# Patient Record
Sex: Male | Born: 1975
Health system: Southern US, Community
[De-identification: ages and names within clinical notes are randomized; demographics above are authoritative.]

## PROBLEM LIST (undated history)

## (undated) DIAGNOSIS — I422 Other hypertrophic cardiomyopathy: Secondary | ICD-10-CM

## (undated) DIAGNOSIS — I77810 Thoracic aortic ectasia: Secondary | ICD-10-CM

## (undated) DIAGNOSIS — I493 Ventricular premature depolarization: Secondary | ICD-10-CM

## (undated) DIAGNOSIS — E785 Hyperlipidemia, unspecified: Secondary | ICD-10-CM

## (undated) DIAGNOSIS — I340 Nonrheumatic mitral (valve) insufficiency: Secondary | ICD-10-CM

## (undated) DIAGNOSIS — I491 Atrial premature depolarization: Secondary | ICD-10-CM

## (undated) DIAGNOSIS — J189 Pneumonia, unspecified organism: Secondary | ICD-10-CM

## (undated) DIAGNOSIS — I4729 Other ventricular tachycardia: Secondary | ICD-10-CM

## (undated) DIAGNOSIS — I441 Atrioventricular block, second degree: Secondary | ICD-10-CM

## (undated) DIAGNOSIS — Z720 Tobacco use: Secondary | ICD-10-CM

## (undated) DIAGNOSIS — I351 Nonrheumatic aortic (valve) insufficiency: Secondary | ICD-10-CM

## (undated) DIAGNOSIS — I517 Cardiomegaly: Secondary | ICD-10-CM

## (undated) DIAGNOSIS — I10 Essential (primary) hypertension: Secondary | ICD-10-CM

## (undated) DIAGNOSIS — I5042 Chronic combined systolic (congestive) and diastolic (congestive) heart failure: Secondary | ICD-10-CM

## (undated) HISTORY — DX: Cardiomegaly: I51.7

## (undated) HISTORY — PX: TONSILLECTOMY: SUR1361

## (undated) HISTORY — DX: Hyperlipidemia, unspecified: E78.5

## (undated) HISTORY — DX: Chronic combined systolic (congestive) and diastolic (congestive) heart failure: I50.42

## (undated) HISTORY — DX: Thoracic aortic ectasia: I77.810

## (undated) HISTORY — DX: Nonrheumatic aortic (valve) insufficiency: I35.1

## (undated) HISTORY — DX: Atrial premature depolarization: I49.1

## (undated) HISTORY — DX: Nonrheumatic mitral (valve) insufficiency: I34.0

## (undated) HISTORY — DX: Pneumonia, unspecified organism: J18.9

## (undated) HISTORY — DX: Other ventricular tachycardia: I47.29

## (undated) HISTORY — PX: KNEE SURGERY: SHX244

## (undated) HISTORY — PX: CARDIAC CATHETERIZATION: SHX172

## (undated) HISTORY — DX: Other hypertrophic cardiomyopathy: I42.2

## (undated) HISTORY — DX: Atrioventricular block, second degree: I44.1

## (undated) HISTORY — DX: Tobacco use: Z72.0

## (undated) HISTORY — DX: Ventricular premature depolarization: I49.3

## (undated) HISTORY — PX: CAROTID STENT INSERTION: SHX5766

---

## 2016-04-16 DIAGNOSIS — F17210 Nicotine dependence, cigarettes, uncomplicated: Secondary | ICD-10-CM | POA: Insufficient documentation

## 2020-05-22 ENCOUNTER — Other Ambulatory Visit: Payer: Self-pay

## 2020-05-22 ENCOUNTER — Encounter (HOSPITAL_COMMUNITY): Payer: Self-pay

## 2020-05-22 ENCOUNTER — Inpatient Hospital Stay (HOSPITAL_COMMUNITY)
Admission: EM | Admit: 2020-05-22 | Discharge: 2020-05-23 | DRG: 304 | Discharge status: Against Medical Advice | Payer: Self-pay | Attending: Family Medicine | Admitting: Family Medicine

## 2020-05-22 ENCOUNTER — Encounter (HOSPITAL_COMMUNITY): Payer: Self-pay | Admitting: Emergency Medicine

## 2020-05-22 ENCOUNTER — Emergency Department (HOSPITAL_COMMUNITY): Payer: Self-pay

## 2020-05-22 ENCOUNTER — Ambulatory Visit (HOSPITAL_COMMUNITY)
Admission: EM | Admit: 2020-05-22 | Discharge: 2020-05-22 | Disposition: A | Discharge status: Home / Self Care | Payer: Self-pay

## 2020-05-22 DIAGNOSIS — I2721 Secondary pulmonary arterial hypertension: Secondary | ICD-10-CM | POA: Diagnosis present

## 2020-05-22 DIAGNOSIS — R059 Cough, unspecified: Secondary | ICD-10-CM

## 2020-05-22 DIAGNOSIS — J189 Pneumonia, unspecified organism: Secondary | ICD-10-CM

## 2020-05-22 DIAGNOSIS — I169 Hypertensive crisis, unspecified: Secondary | ICD-10-CM

## 2020-05-22 DIAGNOSIS — E669 Obesity, unspecified: Secondary | ICD-10-CM | POA: Diagnosis present

## 2020-05-22 DIAGNOSIS — I509 Heart failure, unspecified: Secondary | ICD-10-CM | POA: Diagnosis present

## 2020-05-22 DIAGNOSIS — N179 Acute kidney failure, unspecified: Secondary | ICD-10-CM | POA: Diagnosis present

## 2020-05-22 DIAGNOSIS — I712 Thoracic aortic aneurysm, without rupture: Secondary | ICD-10-CM | POA: Diagnosis present

## 2020-05-22 DIAGNOSIS — Z7982 Long term (current) use of aspirin: Secondary | ICD-10-CM

## 2020-05-22 DIAGNOSIS — Z6834 Body mass index (BMI) 34.0-34.9, adult: Secondary | ICD-10-CM

## 2020-05-22 DIAGNOSIS — I110 Hypertensive heart disease with heart failure: Secondary | ICD-10-CM | POA: Diagnosis present

## 2020-05-22 DIAGNOSIS — Z79899 Other long term (current) drug therapy: Secondary | ICD-10-CM

## 2020-05-22 DIAGNOSIS — Z91048 Other nonmedicinal substance allergy status: Secondary | ICD-10-CM

## 2020-05-22 DIAGNOSIS — F17200 Nicotine dependence, unspecified, uncomplicated: Secondary | ICD-10-CM | POA: Diagnosis present

## 2020-05-22 DIAGNOSIS — E785 Hyperlipidemia, unspecified: Secondary | ICD-10-CM | POA: Diagnosis present

## 2020-05-22 DIAGNOSIS — I161 Hypertensive emergency: Principal | ICD-10-CM | POA: Diagnosis present

## 2020-05-22 DIAGNOSIS — M7989 Other specified soft tissue disorders: Secondary | ICD-10-CM

## 2020-05-22 DIAGNOSIS — I5020 Unspecified systolic (congestive) heart failure: Secondary | ICD-10-CM

## 2020-05-22 DIAGNOSIS — U071 COVID-19: Secondary | ICD-10-CM

## 2020-05-22 HISTORY — DX: Essential (primary) hypertension: I10

## 2020-05-22 HISTORY — DX: Hypertensive crisis, unspecified: I16.9

## 2020-05-22 LAB — CBC
HCT: 42.6 % (ref 39.0–52.0)
Hemoglobin: 14.1 g/dL (ref 13.0–17.0)
MCH: 28.8 pg (ref 26.0–34.0)
MCHC: 33.1 g/dL (ref 30.0–36.0)
MCV: 87.1 fL (ref 80.0–100.0)
Platelets: 282 10*3/uL (ref 150–400)
RBC: 4.89 MIL/uL (ref 4.22–5.81)
RDW: 14.3 % (ref 11.5–15.5)
WBC: 15.3 10*3/uL — ABNORMAL HIGH (ref 4.0–10.5)
nRBC: 0 % (ref 0.0–0.2)

## 2020-05-22 LAB — TROPONIN I (HIGH SENSITIVITY)
Troponin I (High Sensitivity): 404 ng/L (ref <18)
Troponin I (High Sensitivity): 433 ng/L (ref <18)
Troponin I (High Sensitivity): 439 ng/L (ref <18)
Troponin I (High Sensitivity): 468 ng/L (ref <18)

## 2020-05-22 LAB — RAPID URINE DRUG SCREEN, HOSP PERFORMED
Amphetamines: NOT DETECTED
Barbiturates: NOT DETECTED
Benzodiazepines: NOT DETECTED
Cocaine: NOT DETECTED
Opiates: NOT DETECTED
Tetrahydrocannabinol: NOT DETECTED

## 2020-05-22 LAB — BASIC METABOLIC PANEL
Anion gap: 9 (ref 5–15)
BUN: 22 mg/dL — ABNORMAL HIGH (ref 6–20)
CO2: 23 mmol/L (ref 22–32)
Calcium: 9 mg/dL (ref 8.9–10.3)
Chloride: 104 mmol/L (ref 98–111)
Creatinine, Ser: 1.38 mg/dL — ABNORMAL HIGH (ref 0.61–1.24)
GFR, Estimated: >60 mL/min (ref >60)
Glucose, Bld: 123 mg/dL — ABNORMAL HIGH (ref 70–99)
Potassium: 3.5 mmol/L (ref 3.5–5.1)
Sodium: 136 mmol/L (ref 135–145)

## 2020-05-22 LAB — BRAIN NATRIURETIC PEPTIDE: B Natriuretic Peptide: 3522 pg/mL — ABNORMAL HIGH (ref 0.0–100.0)

## 2020-05-22 LAB — HIV ANTIBODY (ROUTINE TESTING W REFLEX): HIV Screen 4th Generation wRfx: NONREACTIVE

## 2020-05-22 LAB — CREATININE, SERUM
Creatinine, Ser: 1.46 mg/dL — ABNORMAL HIGH (ref 0.61–1.24)
GFR, Estimated: >60 mL/min (ref >60)

## 2020-05-22 LAB — RESP PANEL BY RT-PCR (FLU A&B, COVID) ARPGX2
Influenza A by PCR: NEGATIVE
Influenza B by PCR: NEGATIVE
SARS Coronavirus 2 by RT PCR: POSITIVE — AB

## 2020-05-22 MED ORDER — FUROSEMIDE 10 MG/ML IJ SOLN
20.0000 mg | Freq: Once | INTRAMUSCULAR | Status: AC
  Administered 2020-05-22: 20 mg via INTRAVENOUS
  Filled 2020-05-22: qty 2

## 2020-05-22 MED ORDER — DILTIAZEM HCL 25 MG/5ML IV SOLN: 10.0000 mg | Freq: Once | INTRAVENOUS | Status: DC

## 2020-05-22 MED ORDER — SODIUM CHLORIDE 0.9% FLUSH
3.0000 mL | INTRAVENOUS | Status: DC | PRN
Start: 2020-05-22 — End: 2020-05-23
  Administered 2020-05-22: 3 mL via INTRAVENOUS

## 2020-05-22 MED ORDER — HEPARIN BOLUS VIA INFUSION
4000.0000 [IU] | Freq: Once | INTRAVENOUS | Status: DC
  Filled 2020-05-22: qty 4000

## 2020-05-22 MED ORDER — SODIUM CHLORIDE 0.9 % IV SOLN
200.0000 mg | Freq: Once | INTRAVENOUS | Status: DC
  Filled 2020-05-22: qty 40

## 2020-05-22 MED ORDER — ONDANSETRON HCL 4 MG/2ML IJ SOLN: 4.0000 mg | Freq: Four times a day (QID) | INTRAMUSCULAR | Status: DC | PRN

## 2020-05-22 MED ORDER — METOPROLOL TARTRATE 12.5 MG HALF TABLET: 12.5000 mg | ORAL_TABLET | Freq: Two times a day (BID) | ORAL | Status: DC

## 2020-05-22 MED ORDER — SODIUM CHLORIDE 0.9 % IV SOLN
500.0000 mg | INTRAVENOUS | Status: DC
  Administered 2020-05-22: 500 mg via INTRAVENOUS
  Filled 2020-05-22 (×2): qty 500

## 2020-05-22 MED ORDER — NICOTINE 7 MG/24HR TD PT24
7.0000 mg | MEDICATED_PATCH | Freq: Every day | TRANSDERMAL | Status: DC
  Filled 2020-05-22 (×2): qty 1

## 2020-05-22 MED ORDER — SODIUM CHLORIDE 0.9 % IV SOLN
100.0000 mg | Freq: Every day | INTRAVENOUS | Status: DC
  Filled 2020-05-22: qty 20

## 2020-05-22 MED ORDER — SODIUM CHLORIDE 0.9% FLUSH: 3.0000 mL | Freq: Two times a day (BID) | INTRAVENOUS | Status: DC

## 2020-05-22 MED ORDER — ASPIRIN EC 81 MG PO TBEC
81.0000 mg | DELAYED_RELEASE_TABLET | Freq: Every day | ORAL | Status: DC
  Administered 2020-05-23: 81 mg via ORAL
  Filled 2020-05-22: qty 1

## 2020-05-22 MED ORDER — IOHEXOL 350 MG/ML SOLN
75.0000 mL | Freq: Once | INTRAVENOUS | Status: AC | PRN
  Administered 2020-05-22: 75 mL via INTRAVENOUS

## 2020-05-22 MED ORDER — NITROGLYCERIN IN D5W 200-5 MCG/ML-% IV SOLN
0.0000 ug/min | INTRAVENOUS | Status: DC
  Administered 2020-05-22: 5 ug/min via INTRAVENOUS
  Filled 2020-05-22: qty 250

## 2020-05-22 MED ORDER — HEPARIN (PORCINE) 25000 UT/250ML-% IV SOLN
1250.0000 [IU]/h | INTRAVENOUS | Status: DC
  Filled 2020-05-22: qty 250

## 2020-05-22 MED ORDER — ENOXAPARIN SODIUM 40 MG/0.4ML ~~LOC~~ SOLN
40.0000 mg | SUBCUTANEOUS | Status: DC
  Administered 2020-05-22: 40 mg via SUBCUTANEOUS
  Filled 2020-05-22: qty 0.4

## 2020-05-22 MED ORDER — SODIUM CHLORIDE 0.9 % IV SOLN: 250.0000 mL | INTRAVENOUS | Status: DC | PRN

## 2020-05-22 MED ORDER — METOPROLOL TARTRATE 12.5 MG HALF TABLET
12.5000 mg | ORAL_TABLET | Freq: Two times a day (BID) | ORAL | Status: DC
  Administered 2020-05-22 – 2020-05-23 (×2): 12.5 mg via ORAL
  Filled 2020-05-22 (×2): qty 1

## 2020-05-22 MED ORDER — SODIUM CHLORIDE 0.9 % IV SOLN
2.0000 g | INTRAVENOUS | Status: DC
  Administered 2020-05-22: 2 g via INTRAVENOUS
  Filled 2020-05-22: qty 20
  Filled 2020-05-22: qty 2

## 2020-05-22 MED ORDER — ACETAMINOPHEN 325 MG PO TABS
650.0000 mg | ORAL_TABLET | ORAL | Status: DC | PRN
  Administered 2020-05-22 – 2020-05-23 (×2): 650 mg via ORAL
  Filled 2020-05-22 (×2): qty 2

## 2020-05-22 NOTE — ED Notes (Signed)
Pt refused to go to the ED by EMS, states "I feel fine".

## 2020-05-22 NOTE — H&P (Addendum)
[facility] Admission History and Physical Service Pager: [phone]  Patient name: Thomas Spears Medical record number: [id number] Date of birth: 08-27-1975 Age: 45 y.o. Gender: male  Primary Care Provider: Pcp, No Consultants: Cardiology, CCM Code Status: Full code Preferred Emergency Contact: Roxan Hockey, [phone]  Chief Complaint: dyspnea, cough  Assessment and Plan: Thomas Spears is a 45 y.o. male presenting with dyspnea, HA, hypertensive crisis. PMH is significant for TN.  Hypertensive Crisis  HA 45 yo male presents with headache and dyspnea, found to have hypertensive crisis. His BP is elevated to 220s/150-160s. 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 Patient has previously been on lisinopril and HCTZ in the past, but has not used in quite a while, non-adherent. At best when on 2-3 medications, HTN has been 140s-150s at baseline on chart review. HR mildly tachycardic 100-110. Patient was started on nitro infusion in the ED and BP remains persistently in the 220s/150s-160s. Goal is SBP 180. Troponins elevated at 400>433. Discussed case with cardiology fellow, who recommends nicardipine infusion; troponin elevation most likely due to HTN emergency, but will continue to trend. Patient is presently on progressive and overall appears stable and essentially asymptomatic, no focal neuro deficits, but typically protocol is not for two anti-hypertensive infusions outside of the ICU. We have also considered po CCB  vs labetalol since patient does appear stable. As a result, consulted CCM, appreciate recommendations.  -Admit to progressive, FPTS, Dr. Owens Shark -Consult cardiology, appreciate recommendations -Consult CCM, appreciate recommendations -trend troponin  -Continuous cardiac monitoring -ASA 81 mg -monitor BP q59m -Nitro gtt -Heart healthy diet -Up with  assistance -Lovenox DVT ppx  Dyspnea  CHF Patient presents with dyspnea, found to have marked cardiomegaly on CXR. On exam, patient has displaced PMI to near midaxillary line, S3 gallop, only trace peripheral edema, mild JVP. Appears euvolemic overall. In ED, provider noticed that right leg appeared more edematous than left and obtained doppler US that demonstrated no DVT b/l. CTA chest with dilated main pulmonary trunk, indicating pulmonary arterial hypertension. Labs remarkable for BNP 3,522. Patient does not have clear diagnosis of CHF per patient or epic review, but on care everywhere review in 2018 of nuclear scan, LVEF was 37% and there was global hypokinesia wall motion abnormality. Patient is lasix naive, serum cr possibly acutely elevated to 1.38; one dose of lasix 20 mg IV given in ED. Will obtain echo, consult cardiology, appreciate recommendations.  -Consult cardiology -Echo -Strict I&Os -Daily weights -s/p lasix 20 mg IV -AM BMP -PT/OT  AKI vs CKD Patient has mildly elevated serum creatinine to 1.36. Unclear recent baseline. Given degree of hypertension, could have some degree of chronic kidney disease. Per care everywhere, Cr in 2016 was WNL at 0.87. Will monitor s cr daily. Patient got one dose of IV lasix 20 mg. He has signs of severe heart failure on exam, will await echo prior to giving more fluids. Does not appear volume overloaded on exam.   COVID-19 Patient reports 2 weeks of cough, otherwise asymptomatic. Not immunized. Found to be COVID-19 positive on admission. CTA chest with signs of multifocal pna and ground glass opacity, likely to be due to COVID, but see below for CCM recommendations. Patient is stable on RA. Will treat with remdesivir 3 day infusion. -Continuous pulse oximetry -Maintain O2 sats >92% -Airborne and contact isolation -AM CMP -Remdesivir  Leukocytosis  CAP? Mild WBC elevation  to 15.3, however patient has known COVID diagnosis and is afebrile.  Discussed with Dr. Patsey Berthold, CCM/pulmonology, who reports that on read of CT there appears to be more than just ground glass opcaity leading her to believe there may be a bacterial pna at play. She recommends CAP coverage. Will add CTX and azithromycin. Will obtain bld cx prior to abx. - AM CBC - CTX, azithromycin  Hyperlipidemia  Patient has history of HLD, previously on a statin. Also has h/o carotid stent insertion. - Check lipid panel and consider adding statin  Tobacco Use  Smoking since 45 years old 1.5 ppd until 1 month ago, now weaned down to 4 cigarettes per day. Will give nicotine patch and recommend cessation with counseling. -Nicotine patch  Ascending Thoracic Aortic Aneurysm Ascending thoracic aortic aneurysm of 4.7 cm found incidentally on CTA chest on admission, in setting of many years of severe HTN and HTN crisis today. Will refer to [facility] as outpatient for regular monitoring.  Healthcare Maintenance  Patient has not been followed in many years by physicians, has risk factors for comorbidities and father passed away at age 12 from heart and pneumonia related conditions. -Hgb A1c, TSH -TOC for PCP -Recommend USPSTF age appropriate screenings as an outpatient  FEN/GI: heart healthy diet Prophylaxis: lovenox  Disposition: admit to progressive pending medical stabilization and work up  History of Present Illness:  Thomas Spears is a 45 y.o. male presenting with cough, SOB, ankle swelling. He has had a bothersome cough for 2 weeks. He endorses SOB, pleuritic CP on right with coughing. Denies fever, chills, n/v/d. He has not seen a doctor in a long time. Previously he has been on HCTZ, lisinopril, and lipitor, currently not taking them. Per chart review, when he is on medications, his BP is at best 140s-150s. He uses advil as needed. Declines EtOH, last drink 1 year ago. No recreational drugs.   Review Of Systems: Per HPI with the following additions:   Review of Systems   Constitutional: Negative for chills and fever.  Respiratory: Positive for cough and shortness of breath. Negative for wheezing.   Cardiovascular: Positive for chest pain and leg swelling. Negative for palpitations.  Neurological: Positive for headaches.  Psychiatric/Behavioral: Negative for confusion.  All other systems reviewed and are negative.    Patient Active Problem List   Diagnosis Date Noted  . Hypertensive crisis 05/22/2020    Past Medical History: Past Medical History:  Diagnosis Date  . Hypertension     Past Surgical History: Past Surgical History:  Procedure Laterality Date  . CAROTID STENT INSERTION      Social History: Social History   Tobacco Use  . Smoking status: Light Tobacco Smoker  . Smokeless tobacco: Never Used  Substance Use Topics  . Alcohol use: Not Currently   Additional social history: has fiancee and 7 mo child Please also refer to relevant sections of EMR.  Family History: No family history on file. Dad- CABG x5, passed 26yo from pna  Allergies and Medications: Allergies  Allergen Reactions  . Mushroom Extract Complex Anaphylaxis   No current facility-administered medications on file prior to encounter.   Current Outpatient Medications on File Prior to Encounter  Medication Sig Dispense Refill  . aspirin 81 MG EC tablet Take by mouth.    Marland Kitchen atorvastatin (LIPITOR) 40 MG tablet Take by mouth.    Marland Kitchen lisinopril-hydrochlorothiazide (ZESTORETIC) 20-25 MG tablet Take 1 tablet by mouth daily.    . metoprolol succinate (TOPROL-XL) 25 MG 24  hr tablet Take 1 tablet by mouth daily.      Objective: BP (!) 204/148 (BP Location: Right Arm)   Pulse 90   Temp 98.1 F (36.7 C) (Oral)   Resp (!) 29   Ht 5\' 9"  (1.753 m)   Wt 106 kg   SpO2 96%   BMI 34.50 kg/m  Exam: General: appears older than stated age, WM, resting comfortably in bed, NAD, obese Eyes: PERRLA, anicteric sclerae ENTM: clear oropharynx, MMM Neck: no LAD, thyroid not  palpable Cardiovascular: tachycardic rate, regular rhythm, no murmur, strong S3 gallop, PMI near mid axillary line, mild JVD Respiratory: Crackles heard in all lung fields, no iWOB, stable on RA Gastrointestinal: soft, NT, ND, normoactive BM MSK: using all four limbs equally, only trace b/l LE edema Derm: no rashes Neuro: no CN focal deficits, normal sensation, normal strenth, normal reflexes b/l Psych: full affect, anxious appearing, pleasant  Labs and Imaging: CBC BMET  Recent Labs  Lab 05/22/20 1254  WBC 15.3*  HGB 14.1  HCT 42.6  PLT 282   Recent Labs  Lab 05/22/20 1254 05/22/20 1848  NA 136  --   K 3.5  --   CL 104  --   CO2 23  --   BUN 22*  --   CREATININE 1.38* 1.46*  GLUCOSE 123*  --   CALCIUM 9.0  --      EKG: sinus tachycardia with LVH, no QTc prolongation, calculated to be 427  DG Chest 2 View  Result Date: 05/22/2020 CLINICAL DATA:  Chest pain and SOB. EXAM: CHEST - 2 VIEW COMPARISON:  None FINDINGS: There is marked cardiac enlargement. Mild diffuse interstitial edema. No pleural effusion or airspace consolidation. Visualized osseous structures are notable for mild thoracic degenerative disc disease. IMPRESSION: Cardiac enlargement and mild interstitial edema. Electronically Signed   By: Kerby Moors M.D.   On: 05/22/2020 13:15   CT Angio Chest PE W and/or Wo Contrast  Result Date: 05/22/2020 CLINICAL DATA:  Cough, elevated D-dimer EXAM: CT ANGIOGRAPHY CHEST WITH CONTRAST TECHNIQUE: Multidetector CT imaging of the chest was performed using the standard protocol during bolus administration of intravenous contrast. Multiplanar CT image reconstructions and MIPs were obtained to evaluate the vascular anatomy. CONTRAST:  95mL OMNIPAQUE IOHEXOL 350 MG/ML SOLN COMPARISON:  Chest x-ray 05/22/2020 FINDINGS: Cardiovascular: Marked four-chamber cardiomegaly with marked left ventricular hypertrophy. Trace pericardial effusion. Satisfactory opacification of the pulmonary  arteries to the segmental branch level. No filling defect identified to suggest pulmonary embolism. Main pulmonary trunk is dilated measuring 4.1 cm in diameter. 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 Mediastinum/Nodes: Enlarged precarinal lymph node measuring 1.4 cm in diameter. 1.1 cm AP window node. Mildly prominent right hilar lymph nodes. No axillary or left hilar lymphadenopathy. No abnormality of the thyroid, trachea, or esophagus is seen. Lungs/Pleura: Multifocal patchy airspace opacity most pronounced within the right upper lobe with additional areas of opacity present in the superior segment of the right lower lobe as well as within the right middle lobe. Left basilar atelectasis. No pleural effusion. No pneumothorax. Upper Abdomen: No acute abnormality. Musculoskeletal: No acute osseous abnormality. Bilateral gynecomastia. Review of the MIP images confirms the above findings. IMPRESSION: 1. No evidence of acute pulmonary embolism. 2. Multifocal patchy airspace opacity within the right lung most pronounced within the right upper lobe. Findings are concerning for multifocal pneumonia. Follow-up to resolution is recommended. 3. 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 Recommend semi-annual  imaging followup by CTA or MRA and referral to cardiothoracic surgery if not already obtained. This recommendation follows 2010 ACCF/AHA/AATS/ACR/ASA/SCA/SCAI/SIR/STS/SVM Guidelines for the Diagnosis and Management of Patients With Thoracic Aortic Disease. Circulation. 2010; 121: U981-X914. Aortic aneurysm NOS (ICD10-I71.9) 4. Marked four-chamber cardiomegaly with marked left ventricular hypertrophy. 5. Dilated main pulmonary trunk measuring 4.1 cm in diameter, suggesting pulmonary arterial hypertension. 6. Mildly enlarged mediastinal and right hilar lymph nodes, likely reactive. Electronically Signed   By: Davina Poke D.O.   On: 05/22/2020 16:04   VAS [place]  LOWER EXTREMITY VENOUS (DVT) (ONLY [facility] & [facility] 7a-7p)  Result Date: 05/22/2020  Lower Venous DVT Study Indications: Swelling.  Comparison Study: no prior Performing Technologist: Abram Sander RVS  Examination Guidelines: A complete evaluation includes B-mode imaging, spectral Doppler, color Doppler, and power Doppler as needed of all accessible portions of each vessel. Bilateral testing is considered an integral part of a complete examination. Limited examinations for reoccurring indications may be performed as noted. The reflux portion of the exam is performed with the patient in reverse Trendelenburg.  +---------+---------------+---------+-----------+----------+--------------+ RIGHT    CompressibilityPhasicitySpontaneityPropertiesThrombus Aging +---------+---------------+---------+-----------+----------+--------------+ CFV      Full           Yes      Yes                                 +---------+---------------+---------+-----------+----------+--------------+ SFJ      Full                                                        +---------+---------------+---------+-----------+----------+--------------+ FV Prox  Full                                                        +---------+---------------+---------+-----------+----------+--------------+ FV Mid   Full                                                        +---------+---------------+---------+-----------+----------+--------------+ FV DistalFull                                                        +---------+---------------+---------+-----------+----------+--------------+ PFV      Full                                                        +---------+---------------+---------+-----------+----------+--------------+ POP      Full           Yes      Yes                                 +---------+---------------+---------+-----------+----------+--------------+  PTV      Full                                                         +---------+---------------+---------+-----------+----------+--------------+ PERO     Full                                                        +---------+---------------+---------+-----------+----------+--------------+   +----+---------------+---------+-----------+----------+--------------+ LEFTCompressibilityPhasicitySpontaneityPropertiesThrombus Aging +----+---------------+---------+-----------+----------+--------------+ CFV Full           Yes      Yes                                 +----+---------------+---------+-----------+----------+--------------+     Summary: RIGHT: - There is no evidence of deep vein thrombosis in the lower extremity.  - No cystic structure found in the popliteal fossa.  LEFT: - No evidence of common femoral vein obstruction.  *See table(s) above for measurements and observations.    Preliminary     Gladys Damme, MD 05/22/2020, 8:42 PM PGY-2, [place] [facility] pager: [phone], text pages welcome

## 2020-05-22 NOTE — Progress Notes (Signed)
Lower extremity venous has been completed.   Preliminary results in CV Proc.   Abram Sander 05/22/2020 2:48 PM

## 2020-05-22 NOTE — ED Notes (Signed)
Patient transported to CT 

## 2020-05-22 NOTE — Progress Notes (Signed)
Interim progress note:  Checked in on patient for p.m. evaluation.  He was admitted earlier this afternoon for hypertensive emergency and COVID-19 positive status.  He reports " I feel just fine."  States he felt a little nauseous after receiving Lovenox, but otherwise has no complaints.  States his cough is minimally present.  Denies any shortness of breath, chest pain, palpitations, or leg swelling.  Declined remdesivir earlier today, states he did not want to take this since he felt like his cough had already improved.  Discussed indication for remdesivir including decreased hospitalization for COVID-19 and possible morbidity reduction.  Blood pressure (!) 167/132, pulse 84, temperature 97.6 F (36.4 C), temperature source Oral, resp. rate 16, height 5\' 9"  (1.753 m), weight 106 kg, SpO2 91 %. General: NAD, sitting up in bed playing on his phone watching TV HEENT: Mucous membranes moist Cardiac: Regular rate and rhythm, no murmurs appreciated Lungs: Breathing comfortably on RA, overall clear breath sounds anteriorly (difficult to auscultate with disposable stethoscope)  Extremities: Warm, dry, no LE swelling Neuro: Alert and oriented, follows normal conversation  Assessment/plan: Fortunately appears very comfortable on exam.  Continue plan as outlined in H&P.  BP while evaluating the room approximately 180/130, at current goal and will maintain drip as is (or go down at metoprolol etc takes effect).  Troponins overall trending flat with slight up rise, will trend until nadir, likely in the setting of hypertensive emergency.  He denies any active chest pain.  Will have day team to rediscuss remdesivir during the day, hold tonight as patient declined.  Patriciaann Clan, DO

## 2020-05-22 NOTE — ED Provider Notes (Signed)
[facility] EMERGENCY DEPARTMENT Provider Note   CSN: [id number] Arrival date & time: 05/22/20  1237     History Chief Complaint  Patient presents with  . Cough  . Chest Pain  . Shortness of Breath    Thomas Spears is a 45 y.o. male sent over from the urgent care for evaluation of cough, shortness of breath and ankle swelling.  He has a past medical history of hypertension, hyperlipidemia and smoking.  He is status post carotid stent placement.  He has not been taking his high blood pressure medication for the past month.  He states he was "too busy" to get to the doctor.  Patient states that he had a very mild cough which suddenly worsened after an episode that occurred 2 weeks ago when he was sitting in bed and had onset of severe reflux symptoms and shortness of breath lasting approximately 10 minutes which resolved.  He states that since that time he has noticed that he has had swelling in both of his ankle, orthopnea, PND and significant exertional dyspnea.  He states he may have had some weight gain as well.  He denies URI symptoms otherwise.  Cough is nonproductive.  He states that swelling in his ankles was worse but has improved and seems to be worse on the right leg.  Not had any fever or chills.  He denies recurrence of symptoms of heartburn or active chest pain.  HPI     Past Medical History:  Diagnosis Date  . Hypertension     There are no problems to display for this patient.   Past Surgical History:  Procedure Laterality Date  . CAROTID STENT INSERTION         No family history on file.  Social History   Tobacco Use  . Smoking status: Light Tobacco Smoker  . Smokeless tobacco: Never Used  Substance Use Topics  . Alcohol use: Not Currently    Home Medications Prior to Admission medications   Medication Sig Start Date End Date Taking? Authorizing Provider  aspirin 81 MG EC tablet Take by mouth.    [provider]   atorvastatin (LIPITOR) 40 MG tablet Take by mouth. 11/22/16   [provider]  lisinopril-hydrochlorothiazide (ZESTORETIC) 20-25 MG tablet Take 1 tablet by mouth daily. 07/30/16   [provider]  metoprolol succinate (TOPROL-XL) 25 MG 24 hr tablet Take 1 tablet by mouth daily. 07/30/16   [provider]    Allergies    Mushroom extract complex  Review of Systems   Review of Systems Ten systems reviewed and are negative for acute change, except as noted in the HPI.   Physical Exam Updated Vital Signs BP (!) 223/166 (BP Location: Right Arm)   Pulse (!) 106   Temp 99.3 F (37.4 C) (Oral)   Resp 16   SpO2 93%   Physical Exam Vitals and nursing note reviewed.  Constitutional:      General: He is not in acute distress.    Appearance: He is well-developed. He is not diaphoretic.  HENT:     Head: Normocephalic and atraumatic.  Eyes:     General: No scleral icterus.    Conjunctiva/sclera: Conjunctivae normal.  Cardiovascular:     Rate and Rhythm: Regular rhythm. Tachycardia present.     Chest Wall: PMI is displaced.     Heart sounds: Gallop present.      Comments: Swelling of the entire right leg up to the knee.  This  is subtle but greater than the left side.  There is pitting edema behind the ankle. Pulmonary:     Effort: Pulmonary effort is normal. Tachypnea present. No respiratory distress.     Breath sounds: Examination of the right-lower field reveals decreased breath sounds. Examination of the left-lower field reveals decreased breath sounds. Decreased breath sounds present. No rales.  Abdominal:     Palpations: Abdomen is soft.     Tenderness: There is no abdominal tenderness.  Musculoskeletal:     Cervical back: Normal range of motion and neck supple.     Right lower leg: 1+ Pitting Edema present.  Skin:    General: Skin is warm and dry.  Neurological:     Mental Status: He is alert.  Psychiatric:        Behavior: Behavior normal.     ED  Results / Procedures / Treatments   Labs (all labs ordered are listed, but only abnormal results are displayed) Labs Reviewed  CBC - Abnormal; Notable for the following components:      Result Value   WBC 15.3 (*)    All other components within normal limits  RESP PANEL BY RT-PCR (FLU A&B, COVID) [id number]  BASIC METABOLIC PANEL  BRAIN NATRIURETIC PEPTIDE  RAPID URINE DRUG SCREEN, HOSP PERFORMED  TROPONIN I (HIGH SENSITIVITY)    EKG None  Radiology DG Chest 2 View  Result Date: 05/22/2020 CLINICAL DATA:  Chest pain and SOB. EXAM: CHEST - 2 VIEW COMPARISON:  None FINDINGS: There is marked cardiac enlargement. Mild diffuse interstitial edema. No pleural effusion or airspace consolidation. Visualized osseous structures are notable for mild thoracic degenerative disc disease. IMPRESSION: Cardiac enlargement and mild interstitial edema. Electronically Signed   By: Kerby Moors M.D.   On: 05/22/2020 13:15    Procedures .Critical Care Performed by: Margarita Mail, PA-C Authorized by: Margarita Mail, PA-C   Critical care provider statement:    Critical care time (minutes):  70   Critical care time was exclusive of:  Separately billable procedures and treating other patients   Critical care was necessary to treat or prevent imminent or life-threatening deterioration of the following conditions:  Cardiac failure   Critical care was time spent personally by me on the following activities:  Discussions with consultants, evaluation of patient's response to treatment, examination of patient, ordering and performing treatments and interventions, ordering and review of laboratory studies, ordering and review of radiographic studies, pulse oximetry, re-evaluation of patient's condition, obtaining history from patient or surrogate and review of old charts   Care discussed with: admitting provider     Medications Ordered in ED Medications - No data to display  ED Course  I have reviewed the  triage vital signs and the nursing notes.  Pertinent labs & imaging results that were available during my care of the patient were reviewed by me and considered in my medical decision making (see chart for details).  Clinical Course as of 05/22/20 1710  Sun May 22, 2020  1542 B Natriuretic Peptide(!): 3,522.0 [AH]    Clinical Course User Index [AH] Margarita Mail, PA-C   MDM Rules/Calculators/A&P                          CC:sob VS: Severely hypertensive, tachypneic, tachycardic HE:NIDPOEU is gathered by patient and tachycardic. Previous records obtained and reviewed. DDX:The patient's complaint of sob involves an extensive number of diagnostic and treatment options, and is a complaint that carries with  it a high risk of complications, morbidity, and potential mortality. Given the large differential diagnosis, medical decision making is of high complexity. The emergent differential diagnosis for shortness of breath includes, but is not limited to, Pulmonary edema, bronchoconstriction, Pneumonia, Pulmonary embolism, Pneumotherax/ Hemothorax, Dysrythmia, ACS.   Labs: I ordered reviewed and interpreted labs which includes cbc with leukocytosis- APR Troponin elevated> 400 BNP >3000 COVID POS BNP- cr -1.38 Imaging: I ordered and reviewed images which included cxr, cta pe, vas duplex LE and . I independently visualized and interpreted all imaging. [place] neg for dvt. + cardiomegaly and pulm vas congestion on plain film and cta ZOX:WRUEA tach w/lvh Consults- reviewed with cardiology- trend trop, likely due to htn emergency- no heparin until pressures under better control- case discussed with family medicine for admission VWU:[id number] will be admitted for hypertensive emergency Patient disposition:The patient appears reasonably stabilized for admission considering the current resources, flow, and capabilities available in the ED at this time, and I doubt any other [facility] requiring further screening  and/or treatment in the ED prior to admission.  Thomas Spears was evaluated in Emergency Department on 05/24/2020 for the symptoms described in the history of present illness. He was evaluated in the context of the global COVID-19 pandemic, which necessitated consideration that the patient might be at risk for infection with the SARS-CoV-2 virus that causes COVID-19. Institutional protocols and algorithms that pertain to the evaluation of patients at risk for COVID-19 are in a state of rapid change based on information released by regulatory bodies including the CDC and federal and state organizations. These policies and algorithms were followed during the patient's care in the ED.        Final Clinical Impression(s) / ED Diagnoses Final diagnoses:  None    Rx / DC Orders ED Discharge Orders    None       Margarita Mail, PA-C 05/24/20 1936    Malvin Johns, MD 05/24/20 2053

## 2020-05-22 NOTE — Progress Notes (Signed)
[place] for Heparin Indication: chest pain/ACS  Allergies  Allergen Reactions  . Mushroom Extract Complex Anaphylaxis    Patient Measurements: Height: 5\' 9"  (175.3 cm) Weight: 106 kg (233 lb 9.6 oz) IBW/kg (Calculated) : 70.7 Heparin Dosing Weight:93.7 kg  Vital Signs: Temp: 99.3 F (37.4 C) (03/27 1257) Temp Source: Oral (03/27 1257) BP: 223/166 (03/27 1257) Pulse Rate: 106 (03/27 1257)  Labs: Recent Labs    05/22/20 1254  HGB 14.1  HCT 42.6  PLT 282  CREATININE 1.38*  TROPONINIHS 404*    Estimated Creatinine Clearance: 81.9 mL/min (A) (by C-G formula based on SCr of 1.38 mg/dL (H)).   Medical History: Past Medical History:  Diagnosis Date  . Hypertension     Medications:  Scheduled:  . furosemide  20 mg Intravenous Once  . heparin  4,000 Units Intravenous Once    Assessment: Patient is a 25 yom that presents to the ed with CP and SOB. The patient was found to have an elevated initial trop on eval. Pharmacy has been asked to dose heparin at this time for ACS.  Goal of Therapy:  Heparin level 0.3-0.7 units/ml Monitor platelets by anticoagulation protocol: Yes   Plan:  - Heparin bolus 4000 units IV x 1 dose - Heparin drip @ 1250 units/hr - Heparin level in ~ 6 hours  - Monitor patient for s/s of bleeding and CBC while on heparin   Duanne Limerick PharmD. BCPS  05/22/2020,3:08 PM

## 2020-05-22 NOTE — ED Triage Notes (Signed)
Pt reports cough and shortness of breath x 2 weeks, chest discomfort when coughing; swelling ankles x 3 days. Theraflu gives no relief. Denies headcahes

## 2020-05-22 NOTE — Consult Note (Signed)
NAME:  Thomas Spears, MRN:  [id number], DOB:  16-Feb-1976, LOS: 0 ADMISSION DATE:  05/22/2020, CONSULTATION DATE:  3/27 REFERRING MD:  Dr Verlene Mayer, CHIEF COMPLAINT:  Hypertensive crisis   History of Present Illness:  45 year old male with PMH as below, which is significant for HTN. Has been on a regimen of multiple anti-hypertensives and diuretics historically, but has not been taking them for approximately the past 2 months. He presented to [facility] ED 3/27 with complaints of shortness of breath and lower extremity edema x 2 weeks. He also complained of orthopnea and PND. Denied fever/chills/productive cough. His symptoms have been progressive since that time, prompting him to present to the ED. Upon arrival to the emergency department he was noted to be profoundly hypertensive with BP 237/153. Laboratory evaluation was significant for creatinine 1.38, Troponin(HS) 404, and BNP 3500.   Pertinent  Medical History   has a past medical history of Hypertension.  Significant Hospital Events: Including procedures, antibiotic start and stop dates in addition to other pertinent events   . 3/27 admit to [facility] for hypertensive crisis. PCCM consulted . CTA chest with multifocal airspace dz. No PE. Ascending aortic aneurysm 4.7 cm. Cardiomegaly.   Interim History / Subjective:    Objective   Blood pressure (!) 217/157, pulse 99, temperature 98.4 F (36.9 C), temperature source Oral, resp. rate 20, height 5\' 9"  (1.753 m), weight 106 kg, SpO2 91 %.        Intake/Output Summary (Last 24 hours) at 05/22/2020 1916 Last data filed at 05/22/2020 1718 Gross per 24 hour  Intake 7.19 ml  Output -  Net 7.19 ml   Filed Weights   05/22/20 1504  Weight: 106 kg    Examination: General: overweight middle aged male in NAD HENT: [place]/AT, PERRL, no appreciable JVD Lungs: Clear bilateral breath sounds Cardiovascular: RRR, no MRG, no edema.  Abdomen: Soft, non-tender, non-distended Extremities: No acute  deformity, no ROM limitation Neuro: Alert, oriented, nonfocal  Labs/imaging that I have personally reviewed  (right click and "Reselect all SmartList Selections" daily)  Troponin 404 BNP 3500 Creatinine 1.38 CTA chest with multifocal airspace dz. No PE. Ascending aortic aneurysm 4.7 cm.  CXR interstitial prominence.   Resolved Hospital Problem list     Assessment & Plan:   Hypertensive crisis: has been on ACE/HCTZ for about 2 months. UDS negative. - continue nitroglycerine infusion per primary - goal ~ 25% reduction in SBP first 6-24 hours. SBP goal ~ 180 -190 mmHg. Currently 204.  - restart home metoprolol. Hopefully this addition alone will get [place] to goal. Next line would be CCB - Hold off ACE-I considering AKI - Ongoing diuresis - Echocardiogram  AKI Volume overload - Diuresis - Strict I&O - Trend BMP  COVID-19 infection: essentially asymptomatic Possible CAP - CAP coverage per primary - Culture sputum if able - Send for strep/legionella antigen tests  HLD - statin continue  Ascending aortic aneurysm - will need outpatient follow up with thoracic surgery and semi-annual screening scans   Best practice (right click and "Reselect all SmartList Selections" daily)  Diet:  Oral Pain/Anxiety/Delirium protocol (if indicated): No VAP protocol (if indicated): Not indicated DVT prophylaxis: LMWH GI prophylaxis: N/A Glucose control:  SSI No Central venous access:  N/A Arterial line:  N/A Foley:  N/A Mobility:  OOB  PT consulted: N/A Last date of multidisciplinary goals of care discussion [ ]  Code Status:  full code Disposition: PCU  Labs   CBC: Recent Labs  Lab 05/22/20 1254  WBC 15.3*  HGB 14.1  HCT 42.6  MCV 87.1  PLT 283    Basic Metabolic Panel: Recent Labs  Lab 05/22/20 1254  NA 136  K 3.5  CL 104  CO2 23  GLUCOSE 123*  BUN 22*  CREATININE 1.38*  CALCIUM 9.0   GFR: Estimated Creatinine Clearance: 81.9 mL/min (A) (by C-G formula based  on SCr of 1.38 mg/dL (H)). Recent Labs  Lab 05/22/20 1254  WBC 15.3*    Liver Function Tests: No results for input(s): AST, ALT, ALKPHOS, BILITOT, PROT, ALBUMIN in the last 168 hours. No results for input(s): LIPASE, AMYLASE in the last 168 hours. No results for input(s): AMMONIA in the last 168 hours.  ABG No results found for: PHART, PCO2ART, PO2ART, HCO3, TCO2, ACIDBASEDEF, O2SAT   Coagulation Profile: No results for input(s): INR, PROTIME in the last 168 hours.  Cardiac Enzymes: No results for input(s): CKTOTAL, CKMB, CKMBINDEX, TROPONINI in the last 168 hours.  HbA1C: No results found for: HGBA1C  CBG: No results for input(s): GLUCAP in the last 168 hours.  Review of Systems:   Bolds are positive  Constitutional: weight loss, gain, night sweats, Fevers, chills, fatigue .  HEENT: headaches, Sore throat, sneezing, nasal congestion, post nasal drip, Difficulty swallowing, Tooth/dental problems, visual complaints visual changes, ear ache CV:  chest pain, radiates:,Orthopnea, PND, swelling in lower extremities, dizziness, palpitations, syncope.  GI  heartburn, indigestion, abdominal pain, nausea, vomiting, diarrhea, change in bowel habits, loss of appetite, bloody stools.  Resp: cough, productive: , hemoptysis, mild dyspnea, chest pain, pleuritic.  Skin: rash or itching or icterus GU: dysuria, change in color of urine, urgency or frequency. flank pain, hematuria  MS: joint pain or swelling. decreased range of motion  Psych: change in mood or affect. depression or anxiety.  Neuro: difficulty with speech, weakness, numbness, ataxia    Past Medical History:  He,  has a past medical history of Hypertension.   Surgical History:   Past Surgical History:  Procedure Laterality Date  . CAROTID STENT INSERTION       Social History:   reports that he has been smoking. He has never used smokeless tobacco. He reports previous alcohol use.   Family History:  His family  history is not on file.   Allergies Allergies  Allergen Reactions  . Mushroom Extract Complex Anaphylaxis     Home Medications  Prior to Admission medications   Medication Sig Start Date End Date Taking? Authorizing Provider  aspirin 81 MG EC tablet Take by mouth.    [provider]  atorvastatin (LIPITOR) 40 MG tablet Take by mouth. 11/22/16   [provider]  lisinopril-hydrochlorothiazide (ZESTORETIC) 20-25 MG tablet Take 1 tablet by mouth daily. 07/30/16   [provider]  metoprolol succinate (TOPROL-XL) 25 MG 24 hr tablet Take 1 tablet by mouth daily. 07/30/16   [provider]     Critical care time: NA     Georgann Housekeeper, AGACNP-BC [place]  See Amion for personal pager PCCM on call pager [phone] until 7pm. Please call Elink 7p-7a. [phone]  05/22/2020 8:27 PM

## 2020-05-22 NOTE — ED Triage Notes (Signed)
Pt reports non-productive cough, SOB, and chest pain with coughing x 2 weeks.  Bilateral ankle swelling x 3 days.  Sent from [facility].

## 2020-05-23 ENCOUNTER — Inpatient Hospital Stay (HOSPITAL_COMMUNITY): Payer: Self-pay

## 2020-05-23 ENCOUNTER — Other Ambulatory Visit: Payer: Self-pay | Admitting: Family Medicine

## 2020-05-23 DIAGNOSIS — I517 Cardiomegaly: Secondary | ICD-10-CM

## 2020-05-23 LAB — COMPREHENSIVE METABOLIC PANEL
ALT: 55 U/L — ABNORMAL HIGH (ref 0–44)
AST: 40 U/L (ref 15–41)
Albumin: 3.4 g/dL — ABNORMAL LOW (ref 3.5–5.0)
Alkaline Phosphatase: 58 U/L (ref 38–126)
Anion gap: 8 (ref 5–15)
BUN: 19 mg/dL (ref 6–20)
CO2: 27 mmol/L (ref 22–32)
Calcium: 9.3 mg/dL (ref 8.9–10.3)
Chloride: 101 mmol/L (ref 98–111)
Creatinine, Ser: 1.48 mg/dL — ABNORMAL HIGH (ref 0.61–1.24)
GFR, Estimated: 59 mL/min — ABNORMAL LOW (ref >60)
Glucose, Bld: 103 mg/dL — ABNORMAL HIGH (ref 70–99)
Potassium: 3.7 mmol/L (ref 3.5–5.1)
Sodium: 136 mmol/L (ref 135–145)
Total Bilirubin: 1.2 mg/dL (ref 0.3–1.2)
Total Protein: 6.8 g/dL (ref 6.5–8.1)

## 2020-05-23 LAB — TSH: TSH: 0.4 u[IU]/mL (ref 0.350–4.500)

## 2020-05-23 LAB — TROPONIN I (HIGH SENSITIVITY)
Troponin I (High Sensitivity): 381 ng/L (ref <18)
Troponin I (High Sensitivity): 326 ng/L (ref <18)

## 2020-05-23 LAB — CBC
HCT: 39 % (ref 39.0–52.0)
Hemoglobin: 13.3 g/dL (ref 13.0–17.0)
MCH: 29 pg (ref 26.0–34.0)
MCHC: 34.1 g/dL (ref 30.0–36.0)
MCV: 85 fL (ref 80.0–100.0)
Platelets: 298 10*3/uL (ref 150–400)
RBC: 4.59 MIL/uL (ref 4.22–5.81)
RDW: 14.5 % (ref 11.5–15.5)
WBC: 16 10*3/uL — ABNORMAL HIGH (ref 4.0–10.5)
nRBC: 0 % (ref 0.0–0.2)

## 2020-05-23 LAB — LIPID PANEL
Cholesterol: 137 mg/dL (ref 0–200)
HDL: 25 mg/dL — ABNORMAL LOW (ref >40)
LDL Cholesterol: 96 mg/dL (ref 0–99)
Total CHOL/HDL Ratio: 5.5 RATIO
Triglycerides: 80 mg/dL (ref <150)
VLDL: 16 mg/dL (ref 0–40)

## 2020-05-23 LAB — ECHOCARDIOGRAM COMPLETE
Area-P 1/2: 4.31 cm2
Height: 69 in
P 1/2 time: 361 msec
S' Lateral: 5.2 cm
Weight: 3572.8 oz

## 2020-05-23 LAB — HEMOGLOBIN A1C
Hgb A1c MFr Bld: 5.4 % (ref 4.8–5.6)
Mean Plasma Glucose: 108.28 mg/dL

## 2020-05-23 MED ORDER — ASPIRIN 81 MG PO TBEC: 81.0000 mg | DELAYED_RELEASE_TABLET | Freq: Every day | ORAL | 0 refills | Status: DC

## 2020-05-23 MED ORDER — AMLODIPINE BESYLATE 5 MG PO TABS: 5.0000 mg | ORAL_TABLET | Freq: Every day | ORAL | 11 refills | Status: DC

## 2020-05-23 MED ORDER — AZITHROMYCIN 250 MG PO TABS: ORAL_TABLET | ORAL | 0 refills | Status: DC

## 2020-05-23 MED ORDER — ATORVASTATIN CALCIUM 40 MG PO TABS: 40.0000 mg | ORAL_TABLET | Freq: Every day | ORAL | 0 refills | Status: DC

## 2020-05-23 MED ORDER — METOPROLOL SUCCINATE ER 25 MG PO TB24: 25.0000 mg | ORAL_TABLET | Freq: Every day | ORAL | 0 refills | Status: DC

## 2020-05-23 MED ORDER — CEFDINIR 300 MG PO CAPS: 300.0000 mg | ORAL_CAPSULE | Freq: Two times a day (BID) | ORAL | 0 refills | Status: DC

## 2020-05-23 MED FILL — CEFDINIR 300 MG CAPSULE: 300 | 5 days supply | Qty: 10 | Fill #0

## 2020-05-23 MED FILL — ATORVASTATIN CALCIUM 40 MG: 40 | 30 days supply | Qty: 30 | Fill #0

## 2020-05-23 MED FILL — AZITHROMYCIN 250 MG TABLET: 250 | 4 days supply | Qty: 4 | Fill #0

## 2020-05-23 MED FILL — METOPROLOL SUCCINATE ER 25: 25 | 30 days supply | Qty: 30 | Fill #0

## 2020-05-23 MED FILL — ASPIRIN LOW DOSE 81 MG TBEC: 81 | 30 days supply | Qty: 30 | Fill #0

## 2020-05-23 MED FILL — AMLODIPINE BESYLATE 5 MG TA: 5 | 30 days supply | Qty: 30 | Fill #0

## 2020-05-23 NOTE — Progress Notes (Signed)
   05/22/20 1937  Assess: MEWS Score  Temp 98.1 F (36.7 C)  BP (!) 208/143  Pulse Rate (!) 101  ECG Heart Rate (!) 103  Resp (!) 27  SpO2 95 %  O2 Device Room Air  Assess: MEWS Score  MEWS Temp 0  MEWS Systolic 2  MEWS Pulse 1  MEWS RR 2  MEWS LOC 0  MEWS Score 5  MEWS Score Color Red  Assess: if the MEWS score is Yellow or Red  Were vital signs taken at a resting state? Yes  Focused Assessment Change from prior assessment (see assessment flowsheet)  Early Detection of Sepsis Score *See Row Information* Low  MEWS guidelines implemented *See Row Information* Yes  Treat  MEWS Interventions Other (Comment) (continue monitor vital signs at increased frequency)  Take Vital Signs  Increase Vital Sign Frequency  Red: Q 1hr X 4 then Q 4hr X 4, if remains red, continue Q 4hrs  Escalate  MEWS: Escalate Red: discuss with charge nurse/RN and provider, consider discussing with RRT  Notify: Charge Nurse/RN  Name of Charge Nurse/RN Notified J. Tsoutis, RN  Date Charge Nurse/RN Notified 05/22/20  Time Charge Nurse/RN Notified 2000  Notify: Provider  Provider Name/Title Family Medicine Teaching Service  Date Provider Notified 05/22/20  Time Provider Notified 2030  Notification Type Page  Notification Reason Change in status  Provider response Other (Comment) (per on call will come to bedside and check in with patient)  Date of Provider Response 05/22/20  Time of Provider Response 2030  Notify: Rapid Response  Name of Rapid Response RN Notified David  Date Rapid Response Notified 05/22/20  Time Rapid Response Notified 2030

## 2020-05-23 NOTE — Progress Notes (Signed)
Received page from RN due to episodes of asymptomatic bradycardia to 40s-50s while asleep.  Due to brief nature, she has been unable to obtain an EKG for further evaluation.  Asked to have cardiac monitoring strip scanned into chart for evaluation.  Reviewed strip.  Heart rate around 46 bpm, appears sinus.  However, did note more pronounced ST changes compared to previous EKG and requested repeat EKG.  Updated EKG in comparison to earlier this night appears similar.  Continues to have evidence of LVH with ST and T wave changes, most pronounced laterally.  QTc 513 on read, individually calculated around 49ms.  He has denied any chest pain or pressure (even prior to nitro drip for hypertension).  Troponin has been elevated but overall trending flat [phone]), elevation was already discussed with cardiology fellow earlier last night by day team and felt elevation was likely secondary to hypertensive emergency.  Did not recommend additional therapy at that time (other than improvement in HTN).   Will continue to monitor labs, heart rate, and symptoms.  Anticipate cardiology consult in the morning as stated in H&P.  Patriciaann Clan, DO

## 2020-05-23 NOTE — Progress Notes (Signed)
Patient has capacity to make medical decisions, he has clearly stated his preference to leave AMA. He recognizes that he there are risks from leaving the hospital including becoming extremely ill, stroke from hypertensive crisis, more remotely (but still possible) there is chance of death, etc. We will give him medications for CAP (see CCM note and H&P), as well as restart some HTN meds. We have outpatient follow up scheduled for Friday. While he is leaving against medical advice, it is appropriate for harm reduction perspective for patient to receive medications prior to leaving AMA and having outpatient follow up.  Gladys Damme, MD [facility], PGY-2

## 2020-05-23 NOTE — Progress Notes (Signed)
Earlier this shift patient stated was thinking about leaving.  RN explained that if he left it would be against medical advice.  Patient stated understanding.  RN encouraged patient to stay through the night. Patient agreeable at that time.  RN into answer sounding bed alarm at this time and patient stated he was ready to leave.  RN explained that the AMA paper needed to be signed if patient wanted to leave.  Patient agreeable to signing.  RN paged Family Medicine, on call returned page and was updated, on call stated they would come to bedside and speak with patient.

## 2020-05-23 NOTE — Evaluation (Signed)
Physical Therapy Evaluation & Discharge Patient Details Name: Thomas Spears MRN: [id number] DOB: 07/13/1975 Today's Date: 05/23/2020   History of Present Illness  Pt is a 45 y.o. male admitted 05/22/20 with cough, dyspnea, headache; workup for hypertensive crisis, AKI vs. CKD. CXR with marked cardiomegaly. Tested (+) COVID-19 on admission. Chest CTA with multifocal PNA; incidental finding thoracic aortic aneurysm. PMH includes HTN.    Clinical Impression  Patient evaluated by Physical Therapy with no further acute PT needs identified. PTA, pt independent, lives with family, works from home. Today, pt independent with mobility and ADL tasks; reports asymptomatic despite slight headache (attributes this to having not eaten since admission). All education has been completed and the patient has no further questions. Acute PT is signing off. Thank you for this referral.  Seated BP 182/138, HR 95 Post-mobility BP 194/139, HR 109    Follow Up Recommendations No PT follow up    Equipment Recommendations  None recommended by PT    Recommendations for Other Services       Precautions / Restrictions Precautions Precautions: Other (comment) Precaution Comments: Hypertensive Restrictions Weight Bearing Restrictions: No      Mobility  Bed Mobility Overal bed mobility: Modified Independent             General bed mobility comments: HOB elevated    Transfers Overall transfer level: Independent Equipment used: None                Ambulation/Gait Ambulation/Gait assistance: Independent Social research officer, government (Feet): 120 Feet Assistive device: None;IV Pole Gait Pattern/deviations: Step-through pattern;Decreased stride length;Decreased dorsiflexion - right   Gait velocity interpretation: >2.62 ft/sec, indicative of community ambulatory General Gait Details: Able to ambulate multiple laps throughout room indep with and without pushing IV pole; good obstacle negotiation; no overt  instability or LOB noted  Stairs            Wheelchair Mobility    Modified Rankin (Stroke Patients Only)       Balance Overall balance assessment: No apparent balance deficits (not formally assessed)                                           Pertinent Vitals/Pain Pain Assessment: Faces Faces Pain Scale: Hurts a little bit Pain Location: R shoulder with active flex/abd > 90' Pain Intervention(s): Monitored during session    Home Living Family/patient expects to be discharged to:: Private residence Living Arrangements: Spouse/significant other;Children Available Help at Discharge: Family Type of Home: House       Home Layout: One level Home Equipment: None Additional Comments: Lives with fiance and 69 month old son    Prior Function Level of Independence: Independent         Comments: Works from home, 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        Extremity/Trunk Assessment   Upper Extremity Assessment Upper Extremity Assessment: RUE deficits/detail RUE Deficits / Details: reports h/o R shoulder work-related injury with ortho consult scheduled; shoulder AROM/PROM to ~125' then onset pain    Lower Extremity Assessment Lower Extremity Assessment: RLE deficits/detail RLE Deficits / Details: reports h/o R knee/ankle injuries with chronic foot drops, tibial/hip ER; hip and knee strength functionally >3/5 throughout    Cervical / Trunk Assessment Cervical / Trunk Assessment: Normal  Communication   Communication: No difficulties  Cognition Arousal/Alertness: Awake/alert Behavior  During Therapy: WFL for tasks assessed/performed Overall Cognitive Status: Within Functional Limits for tasks assessed                                        General Comments General comments (skin integrity, edema, etc.): Seated BP 182/138, HR 95; post-mobility BP 194/139, HR 109    Exercises      Assessment/Plan    PT Assessment Patent does not need any further PT services  PT Problem List         PT Treatment Interventions      PT Goals (Current goals can be found in the Care Plan section)  Acute Rehab PT Goals PT Goal Formulation: All assessment and education complete, DC therapy    Frequency     Barriers to discharge        Co-evaluation               AM-PAC PT "6 Clicks" Mobility  Outcome Measure Help needed turning from your back to your side while in a flat bed without using bedrails?: None Help needed moving from lying on your back to sitting on the side of a flat bed without using bedrails?: None Help needed moving to and from a bed to a chair (including a wheelchair)?: None Help needed standing up from a chair using your arms (e.g., wheelchair or bedside chair)?: None Help needed to walk in hospital room?: None Help needed climbing 3-5 steps with a railing? : None 6 Click Score: 24    End of Session   Activity Tolerance: Patient tolerated treatment well Patient left: in bed;with call bell/phone within reach Nurse Communication: Mobility status PT Visit Diagnosis: Other abnormalities of gait and mobility (R26.89)    Time: 4801-6553 PT Time Calculation (min) (ACUTE ONLY): 15 min   Charges:   PT Evaluation $PT Eval Low Complexity: 1 Low        Mabeline Caras, PT, DPT Acute Rehabilitation Services  Pager [phone] Office [place] 05/23/2020, 9:30 AM

## 2020-05-23 NOTE — Progress Notes (Signed)
Patient refusing further treatment and leaving AMA.  Signed AMA paperwork on chart.

## 2020-05-23 NOTE — Progress Notes (Signed)
On call for Family Medicine updated pertaining to patient having episodes of bradycardia; appears to occur when patient is sleeping.  When patient awake denies symptoms of bradycardia.  Order entered per on call.

## 2020-05-23 NOTE — Progress Notes (Signed)
OT Cancellation Note  Patient Details Name: Thomas Spears MRN: [id number] DOB: [birth year]   Cancelled Treatment:    Reason Eval/Treat Not Completed: OT screened, no needs identified, will sign off. Pt is near baseline for ADLs and functional mobility per PT. PT reports he is comfortable with dc to home and feels near baseline with exception of HA.   [place], OTR/L Acute Rehab Pager: [phone] Office: [phone] 05/23/2020, 10:28 AM

## 2020-05-23 NOTE — Progress Notes (Signed)
Called to patient room by RN as patient stated that he was wanting to leave again.When speaking with patient he stated that he was aware that he can have a stroke and die if he were to leave the hospital and that right now he '"feels great". When asked why he wanted to leave, he stated that he wanted to be home with his wife and son and not in the hospital where he is woken up and labs are being drawn all the time. Patient stated that he would see a doctor after he left the hospital. Discussed with patient about staying through the morning until we would be able to get him medications to go home with if he still was not willing to stay in the hospital for his dangerously high blood pressures. Patient was agreeable, but anticipate that he will leave against medical advice before his blood pressures have improved; patient appears fully aware of the consequences of leaving. We will continue to talk with him to discuss him staying longer if he expresses these wishes again.  Blood pressure (!) 193/134, pulse 97, temperature (!) 96.8 F (36 C), temperature source Axillary, resp. rate 20, height 5\' 9"  (1.753 m), weight 106 kg, SpO2 95 %.  Gen: well-appearing, asleep initially but easily awoken to his name    Aftan Vint, DO

## 2020-05-23 NOTE — Progress Notes (Signed)
  Echocardiogram 2D Echocardiogram has been performed.  [name] 05/23/2020, 9:48 AM

## 2020-05-23 NOTE — Discharge Summary (Signed)
[facility] Discharge Summary  Patient name: Thomas Spears Medical record number: [id number] Date of birth: 1975/07/02 Age: 45 y.o. Gender: male Date of Admission: 05/22/2020  Date of Discharge: 05/23/20 Admitting Physician: Gladys Damme, MD  Primary Care Provider: Pcp, No Consultants: CCM   Indication for Hospitalization: Hypertensive crisis   Discharge Diagnoses/Problem List:  Hypertensive crisis  Renal insufficiency  Hyperlipidemia  Ascending thoracic aortic aneurysm   Disposition: Patient left AMA  Discharge Condition: Unstable   Discharge Exam:  Temp:  [96.8 F (36 C)-98.4 F (36.9 C)] 97 F (36.1 C) (03/28 0812) Pulse Rate:  [65-110] 98 (03/28 0903) Resp:  [13-29] 16 (03/28 0812) BP: (167-221)/(101-157) 194/139 (03/28 0903) SpO2:  [91 %-96 %] 95 % (03/28 0812) Weight:  [101.3 kg] 101.3 kg (03/28 0559) Physical Exam: General: alert, sitting comfortably in bed, NAD Cardiovascular: Tachycardic rate. Regular rhythm.S3 gallop  Respiratory: Crackles in all lung fields diffusely  Abdomen: soft, non distended, nontender  Extremities: moves equally and spontaneously   Brief Hospital Course:   Thomas Spears is a 45 y.o. male presenting with dyspnea, HA, hypertensive crisis. PMH is significant for TN.  Hypertensive Crisis  HA BP on admission was elevated to 220s/150-160s. 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 HR mildly tachycardic 100-110. Patient was started on nitro infusion in the ED and BP remains persistently in the 220s/150s-160s. Troponins elevated at 400>433. Patient was placed on Nitro drip with frequent blood pressure monitoring. His blood pressures remained elevated, and patient left abruptly against medical advice despite being fully aware of the risks involved.   Issues for Follow Up:  1. Patient needs to establish care with a PCP and  have hypertension treated and risk stratification work up done   Significant Procedures: None  Significant Labs and Imaging:  Recent Labs  Lab 05/22/20 1254 05/23/20 0239  WBC 15.3* 16.0*  HGB 14.1 13.3  HCT 42.6 39.0  PLT 282 298   Recent Labs  Lab 05/22/20 1254 05/22/20 1848 05/23/20 0239  NA 136  --  136  K 3.5  --  3.7  CL 104  --  101  CO2 23  --  27  GLUCOSE 123*  --  103*  BUN 22*  --  19  CREATININE 1.38* 1.46* 1.48*  CALCIUM 9.0  --  9.3  ALKPHOS  --   --  58  AST  --   --  40  ALT  --   --  55*  ALBUMIN  --   --  3.4*     Results/Tests Pending at Time of Discharge: None   Discharge Medications:  Allergies as of 05/23/2020      Reactions   Mushroom Extract Complex Anaphylaxis      Medication List    STOP taking these medications   lisinopril-hydrochlorothiazide 20-25 MG tablet Commonly known as: ZESTORETIC     TAKE these medications   amLODipine 5 MG tablet Commonly known as: NORVASC Take 1 tablet (5 mg total) by mouth daily.   aspirin 81 MG EC tablet Take 1 tablet (81 mg total) by mouth daily. Swallow whole. Start taking on: May 24, 2020 What changed:   how much to take  when to take this  additional instructions   atorvastatin 40 MG tablet Commonly known as: LIPITOR Take 1 tablet (40 mg total) by mouth daily. What changed:   how much to take  when to take this   azithromycin 250 MG tablet Commonly known as: Zithromax Take one pill a day for 4 days   cefdinir 300 MG capsule Commonly known as: OMNICEF Take 1 capsule (300 mg total) by mouth 2 (two) times daily.   metoprolol succinate 25 MG 24 hr tablet Commonly known as: TOPROL-XL Take 1 tablet (25 mg total) by mouth daily.       Discharge Instructions: Please refer to Patient Instructions section of EMR for full details.  Patient was counseled important signs and symptoms that should prompt return to medical care, changes in medications, dietary instructions, activity  restrictions, and follow up appointments.   Follow-Up Appointments:  Follow-up [place]. Go on 05/27/2020.   Specialty: Family Medicine Why: Please arrive for appt at 1:30 PM Contact information: [address] [id number] [place]              Shary Key, DO 05/23/2020, 5:21 PM PGY-1, [place]

## 2020-05-27 ENCOUNTER — Ambulatory Visit (INDEPENDENT_AMBULATORY_CARE_PROVIDER_SITE_OTHER): Payer: Self-pay | Admitting: Family Medicine

## 2020-05-27 ENCOUNTER — Other Ambulatory Visit: Payer: Self-pay

## 2020-05-27 VITALS — BP 193/139 | HR 97 | Ht 69.0 in | Wt 226.6 lb

## 2020-05-27 DIAGNOSIS — I712 Thoracic aortic aneurysm, without rupture, unspecified: Secondary | ICD-10-CM

## 2020-05-27 DIAGNOSIS — I169 Hypertensive crisis, unspecified: Secondary | ICD-10-CM

## 2020-05-27 DIAGNOSIS — I5020 Unspecified systolic (congestive) heart failure: Secondary | ICD-10-CM

## 2020-05-27 DIAGNOSIS — I10 Essential (primary) hypertension: Secondary | ICD-10-CM

## 2020-05-27 DIAGNOSIS — J189 Pneumonia, unspecified organism: Secondary | ICD-10-CM

## 2020-05-27 LAB — CULTURE, BLOOD (ROUTINE X 2)
Culture: NO GROWTH
Culture: NO GROWTH
Special Requests: ADEQUATE
Special Requests: ADEQUATE

## 2020-05-27 MED ORDER — LOSARTAN POTASSIUM 25 MG PO TABS: 25.0000 mg | ORAL_TABLET | Freq: Every day | ORAL | 3 refills | Status: DC

## 2020-05-27 MED ORDER — AMLODIPINE BESYLATE 10 MG PO TABS: 10.0000 mg | ORAL_TABLET | Freq: Every day | ORAL | 11 refills | Status: DC

## 2020-05-27 NOTE — Assessment & Plan Note (Signed)
Continue metoprolol XL 25 mg once daily.  No signs of fluid overload today on examination today so will not start Lasix.  Referred to cardiology.

## 2020-05-27 NOTE — Assessment & Plan Note (Addendum)
Blood pressure significantly elevated to 210/130 and 193/139.  Explained to patient that this is a hypertensive emergency/hypertensive emergency and that he should go to the ER ASAP.  He reported that he needs to look after his 30-month-old son when he goes home but will go to the ER later tonight.  I explained the risks of him not going to the ER right now such as CVA, MI, renal failure etc. He did express understanding of these risks. He also demonstrated that he would like to improve his health for his family.  I explained that if he gets admitted to the hospital later tonight that he should not leave AMA as this condition is life-threatening, patient was in agreement. -Increased amlodipine to 10 mg -Added losartan 25 mg -BMP today -Instructed patient to go to the ER ASAP, may need to be placed on nitro drip -Strict return precautions provided -Follow-up next week in access to care on 5 April for BP follow-up if he is not already in the hospital.

## 2020-05-27 NOTE — Patient Instructions (Signed)
Thank you for coming to see me today. It was a pleasure. Today we discussed your blood pressures. They are extremely elevated. I recommend going to the ER ASAP.   I am increasing the amlodipine to 10mg  and adding Losartan 25mg  for your blood pressures. Check your blood pressures at home morning and night for the next 2 weeks and keep a diary. Bring the diary to the next visit   For your heart failure I am referring you to the cardiology.  For the aneurysm I am referring you to cardiothoracic surgery.  Please follow-up with the doctor next Tuesday.  If you have any questions or concerns, please do not hesitate to call the office at [phone].  Best wishes,   Dr Posey Pronto

## 2020-05-27 NOTE — Progress Notes (Signed)
     SUBJECTIVE:   CHIEF COMPLAINT / HPI:   Keysean Savino is a 45 y.o. male presents for hospital follow up  Hospital follow up, hypertensive crisis Patient admitted to hospital between 05/22/20 and 05/23/20 and treated for Hypertensive crises. Bps initially 220s/150-160s on admission. CTA without PE but with signs of pulmonary artery HTN, andascending thoracic aortic aneurysm of 4.7 cm. Pt placed on Nitro gtt. Echo: HFrEF 20-25%. Pt left AMA despite being fully aware of the risks involved on 05/23/20. Presents today for follow up.. Reports improvement in symptoms since discharge.Denies vision changes, chest pain, vomiting etc. Does have a left sided headache but this is because he did not sleep very well last night as his 97-month-old baby kept him up all night.   Guadalupe Guerra Office Visit from 05/27/2020 in [facility]  PHQ-9 Total Score 1       Health Maintenance Due  Topic  . Hepatitis C Screening   . COVID-19 Vaccine (1)  . TETANUS/TDAP     HFrEF Denies shortness of breath or chest pain.  CAP Cough is improved since leaving the hospital.  He has 2 more tablets of azithromycin left before he completes the course.  PERTINENT  PMH / PSH:   OBJECTIVE:   BP (!) 193/139   Pulse 97   Ht 5\' 9"  (1.753 m)   Wt 226 lb 9.6 oz (102.8 kg)   SpO2 97%   BMI 33.46 kg/m    Initial BP 210/130  General: Alert, no acute distress, pleasant Cardio: Normal S1 and S2, RRR, no r/m/g Pulm: CTAB, normal work of breathing Extremities: Mild 1+ peripheral pitting edema Neuro: Cranial nerves grossly intact   ASSESSMENT/PLAN:   Hypertensive crisis Blood pressure significantly elevated to 210/130 and 193/139.  Explained to patient that this is a hypertensive emergency/hypertensive emergency and that he should go to the ER ASAP.  He reported that he needs to look after his 86-month-old son when he goes home but will go to the ER later tonight.  I explained the risks of him  not going to the ER right now such as CVA, MI, renal failure etc. He did express understanding of these risks. He also demonstrated that he would like to improve his health for his family.  I explained that if he gets admitted to the hospital later tonight that he should not leave AMA as this condition is life-threatening, patient was in agreement. -Increased amlodipine to 10 mg -Added losartan 25 mg -BMP today -Instructed patient to go to the ER ASAP, may need to be placed on nitro drip -Strict return precautions provided -Follow-up next week in access to care on 5 April for BP follow-up if he is not already in the hospital.  HFrEF (heart failure with reduced ejection fraction) (HCC) Continue metoprolol XL 25 mg once daily.  No signs of fluid overload today on examination today so will not start Lasix.  Referred to cardiology.   Thoracic aortic aneurysm ([place]) CTPA in hospital showed ascending thoracic aortic aneurysm measuring up to 4.7 cm in diameter.  Explained to patient what this means and the risk of rupture.  Referred to cardiothoracic surgery.  Strict return precautions provided.  Community acquired pneumonia Patient reports improvement in symptoms-cough has improved. Unremarkable pulmonary exam.  Patient's sats 97% on air.  Continue last 2 doses of azithromycin.     Lattie Haw, MD PGY-2 [place]

## 2020-05-27 NOTE — Assessment & Plan Note (Signed)
Patient reports improvement in symptoms-cough has improved. Unremarkable pulmonary exam.  Patient's sats 97% on air.  Continue last 2 doses of azithromycin.

## 2020-05-27 NOTE — Assessment & Plan Note (Addendum)
CTPA in hospital showed ascending thoracic aortic aneurysm measuring up to 4.7 cm in diameter.  Explained to patient what this means and the risk of rupture.  Referred to cardiothoracic surgery.  Strict return precautions provided.

## 2020-05-28 LAB — BASIC METABOLIC PANEL
BUN/Creatinine Ratio: 16 (ref 9–20)
BUN: 22 mg/dL (ref 6–24)
CO2: 22 mmol/L (ref 20–29)
Calcium: 9.8 mg/dL (ref 8.7–10.2)
Chloride: 102 mmol/L (ref 96–106)
Creatinine, Ser: 1.41 mg/dL — ABNORMAL HIGH (ref 0.76–1.27)
Glucose: 78 mg/dL (ref 65–99)
Potassium: 4.5 mmol/L (ref 3.5–5.2)
Sodium: 141 mmol/L (ref 134–144)
eGFR: 63 mL/min/{1.73_m2} (ref >59)

## 2020-05-31 ENCOUNTER — Ambulatory Visit (INDEPENDENT_AMBULATORY_CARE_PROVIDER_SITE_OTHER): Payer: Self-pay | Admitting: Family Medicine

## 2020-05-31 ENCOUNTER — Other Ambulatory Visit: Payer: Self-pay

## 2020-05-31 VITALS — BP 160/110 | HR 68 | Ht 69.0 in | Wt 224.4 lb

## 2020-05-31 DIAGNOSIS — I712 Thoracic aortic aneurysm, without rupture, unspecified: Secondary | ICD-10-CM

## 2020-05-31 DIAGNOSIS — I5020 Unspecified systolic (congestive) heart failure: Secondary | ICD-10-CM

## 2020-05-31 DIAGNOSIS — I10 Essential (primary) hypertension: Secondary | ICD-10-CM | POA: Insufficient documentation

## 2020-05-31 DIAGNOSIS — I169 Hypertensive crisis, unspecified: Secondary | ICD-10-CM

## 2020-05-31 NOTE — Assessment & Plan Note (Signed)
Appointment scheduled with cardiothoracic surgery on 4/21.

## 2020-05-31 NOTE — Patient Instructions (Addendum)
Wonderful to see you!   I will see if we can get you a blood pressure cuff to have a home.   Checking your kidney function and then likely increase your blood pressure medication.   Meds:  Amlodipine 10 mg (take two of 5 mg tablets)  Losartan 25mg   Metoprolol 25mg 

## 2020-05-31 NOTE — Assessment & Plan Note (Signed)
Actually appears euvolemic on exam, will hold off on Lasix therapy.  Awaiting referral to cardiology.  Continue metoprolol, ASA, Lipitor.

## 2020-05-31 NOTE — Progress Notes (Signed)
    SUBJECTIVE:   CHIEF COMPLAINT / HPI: HTN follow up    Thomas Spears is a 45 year old gentleman presenting for hypertension follow-up.  He was most recently seen on 4/1 for a hospital follow-up (admitted for hypertensive urgency/emergency but left AMA), and continued to have significantly elevated pressures during that visit and so recommended ED evaluation again.  He did not go, and has not been interested in returning due to taking care of his 81-month-old child at home.  Today, he reports he feels "amazing." He started the amlodipine 10 mg and losartan 25 mg prescribed by Dr. Posey Pronto last week and feels like this is done a good job of bringing his blood pressure down.  He denies any chest pain, palpitations, shortness of breath, headache, blurred vision, leg swelling, dyspnea on exertion.  Cough is substantially improved, completed antibiotics.  His home blood pressure cuff is broken and cannot afford to get a new one.  He has no insurance, awaiting paperwork from [facility] to qualify for financial assistance.  No insurance.  He is now down to 2 cigarettes daily and working on completely quitting.  PERTINENT  PMH / PSH: HFrEF 25%, severe hypertension, COVID-19 in 04/2020  OBJECTIVE:   BP (!) 160/110   Pulse 68   Ht 5\' 9"  (1.753 m)   Wt 224 lb 6 oz (101.8 kg)   SpO2 98%   BMI 33.13 kg/m   General: Alert, NAD HEENT: NCAT, MMM, no JVD appreciated, EOMI  Cardiac: RRR no m/g/r Lungs: Clear bilaterally, no increased WOB on RA  Msk: Moves all extremities spontaneously, normal gait   Ext: Warm, dry, 2+ distal pulses, no LE edema bilaterally   ASSESSMENT/PLAN:   Severe uncontrolled hypertension Still quite elevated today, but actually substantially improved from last week. Asymptomatic and longstanding, unfortunately with evidence of severe LVH and HFrEF 25%.  Unclear if he has had ischemic work-up in the past, left AMA prior to cardiology evaluation in admission last week.  Continue metoprolol XR 25  mg daily, amlodipine 10 mg daily, and losartan 25 mg.  Check BMP today, if cr rise appropriate after new ARB start, will increase to 50.  Will send Rx to get home BP cuff.  HFrEF (heart failure with reduced ejection fraction) ([place]) Actually appears euvolemic on exam, will hold off on Lasix therapy.  Awaiting referral to cardiology.  Continue metoprolol, ASA, Lipitor.  Thoracic aortic aneurysm [facility]) Appointment scheduled with cardiothoracic surgery on 4/21.    Follow-up next week or sooner to ED if any chest pain/SOB/blurred vision etc. Will call following BMP results.  Patriciaann Clan, [place]

## 2020-05-31 NOTE — Assessment & Plan Note (Addendum)
Still quite elevated today, but actually substantially improved from last week. Asymptomatic and longstanding, unfortunately with evidence of severe LVH and HFrEF 25%.  Unclear if he has had ischemic work-up in the past, left AMA prior to cardiology evaluation in admission last week.  Continue metoprolol XR 25 mg daily, amlodipine 10 mg daily, and losartan 25 mg.  Check BMP today, if cr rise appropriate after new ARB start, will increase to 50.  Will send Rx to get home BP cuff.

## 2020-06-01 LAB — BASIC METABOLIC PANEL
BUN/Creatinine Ratio: 17 (ref 9–20)
BUN: 24 mg/dL (ref 6–24)
CO2: 22 mmol/L (ref 20–29)
Calcium: 9.7 mg/dL (ref 8.7–10.2)
Chloride: 102 mmol/L (ref 96–106)
Creatinine, Ser: 1.38 mg/dL — ABNORMAL HIGH (ref 0.76–1.27)
Glucose: 88 mg/dL (ref 65–99)
Potassium: 4.6 mmol/L (ref 3.5–5.2)
Sodium: 140 mmol/L (ref 134–144)
eGFR: 65 mL/min/{1.73_m2} (ref >59)

## 2020-06-06 NOTE — Progress Notes (Signed)
    SUBJECTIVE:   CHIEF COMPLAINT / HPI: F/u HTN   Thomas Spears is a 45 year old gentleman presenting for follow-up and to discuss the following:  Hypertension: Brought all of his medications today.  Most recently seen on 4/5, increased losartan to 50 mg at that time.  He additionally is taking Norvasc 10 mg and metoprolol XL 25 mg daily with compliance.  He now has a BP cuff at home, but forgot to bring his blood pressure journal/cuff in today.  He cannot remember the values, but feels like it has been around 726 systolic.  He reports that he continues to feel "great."Denies any chest pain, palpitations, shortness of breath, lower extremity swelling, lightheadedness/dizziness, blurred vision, or headache.  ED: Reports he has had trouble with this for the past month or so, feels like it is getting worse in the sense that he is unable to get an erection for very long.  He still is able to have a morning erection, but does seem to happen less often.  Still aroused and wants to be sexually active.  Prior to this issue he was able to be sexually active without concern.  He currently walks approximately 30-45 minutes every day at a moderate pace without any chest pain, SOB.  PERTINENT  PMH / PSH: HFrEF 25%, severe hypertension, COVID-19 in 04/2020  OBJECTIVE:   BP (!) 168/102   Pulse 90   Ht 5\' 9"  (1.753 m)   Wt 226 lb (102.5 kg)   SpO2 99%   BMI 33.37 kg/m   General: Alert, NAD HEENT: NCAT, MMM, no JVD  Cardiac: RRR no m/g/r Lungs: Clear bilaterally without crackles, no increased WOB  Msk: Normal gait  Ext: Warm, dry, 2+ distal pulses, no edema b/l   ASSESSMENT/PLAN:   Severe uncontrolled hypertension Still elevated, but substantially improved from previous.  Asymptomatic and longstanding.  Will increase losartan to 100 mg daily and check BMP on follow-up visit.  Continue Norvasc 10 mg and metoprolol XR 25 mg.  Instructed to bring his BP cuff and journal to next visit.  HFrEF (heart  failure with reduced ejection fraction) (HCC) Severe LVH and EF 25% on recent echo in 04/2020, suspect 2/2 longstanding uncontrolled hypertension.  [place] class I.  Still appears euvolemic and will hold off on Lasix for now.  Continue losartan, metoprolol, ASA, and Lipitor.  Awaiting cardiology evaluation scheduled for 5/27.   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  Able to maintain activity > 4 METS without chest pain or SOB. [place] Class I heart failure as above and not on long-term or intermittent nitrate.  Rx'd sildenafil 50 mg PRN, max 100mg .  Instructed of contraindication of this medication with nitrates, must inform health personnel if he has taken this medication within 24 hours if evaluation for chest pain etc.    Follow-up in approximately 1 week or sooner if needed, obtain BMP on follow-up.  Patriciaann Clan, [place]

## 2020-06-07 ENCOUNTER — Other Ambulatory Visit: Payer: Self-pay

## 2020-06-07 ENCOUNTER — Encounter: Payer: Self-pay | Admitting: Family Medicine

## 2020-06-07 ENCOUNTER — Ambulatory Visit (INDEPENDENT_AMBULATORY_CARE_PROVIDER_SITE_OTHER): Payer: Self-pay | Admitting: Family Medicine

## 2020-06-07 VITALS — BP 168/102 | HR 90 | Ht 69.0 in | Wt 226.0 lb

## 2020-06-07 DIAGNOSIS — I5020 Unspecified systolic (congestive) heart failure: Secondary | ICD-10-CM

## 2020-06-07 DIAGNOSIS — N529 Male erectile dysfunction, unspecified: Secondary | ICD-10-CM

## 2020-06-07 DIAGNOSIS — I10 Essential (primary) hypertension: Secondary | ICD-10-CM

## 2020-06-07 MED ORDER — LOSARTAN POTASSIUM 100 MG PO TABS: 100.0000 mg | ORAL_TABLET | Freq: Every day | ORAL | 1 refills | Status: DC

## 2020-06-07 MED ORDER — SILDENAFIL CITRATE 50 MG PO TABS: 50.0000 mg | ORAL_TABLET | Freq: Every day | ORAL | 0 refills | Status: DC | PRN

## 2020-06-07 NOTE — Patient Instructions (Signed)
It was wonderful to see you today.  I am going to send in losartan 100 mg for you to start.  You can take 4 tablets of your 25 mg to finish those out.  Additionally I will send in a medication called sildenafil to use prior to when you would like an erection.  If you have any lightheadedness/dizziness with this medication, please stop.

## 2020-06-08 ENCOUNTER — Encounter: Payer: Self-pay | Admitting: Family Medicine

## 2020-06-08 DIAGNOSIS — N529 Male erectile dysfunction, unspecified: Secondary | ICD-10-CM | POA: Insufficient documentation

## 2020-06-08 NOTE — Assessment & Plan Note (Signed)
Subacute, suspect vasculogenic.  Able to maintain activity > 4 METS without chest pain or SOB. [place] Class I heart failure as above and not on long-term or intermittent nitrate.  Rx'd sildenafil 50 mg PRN, max 100mg .  Instructed of contraindication of this medication with nitrates, must inform health personnel if he has taken this medication within 24 hours if evaluation for chest pain etc.

## 2020-06-08 NOTE — Assessment & Plan Note (Signed)
Severe LVH and EF 25% on recent echo in 04/2020, suspect 2/2 longstanding uncontrolled hypertension.  [place] class I.  Still appears euvolemic and will hold off on Lasix for now.  Continue losartan, metoprolol, ASA, and Lipitor.  Awaiting cardiology evaluation scheduled for 5/27.

## 2020-06-08 NOTE — Assessment & Plan Note (Signed)
Still elevated, but substantially improved from previous.  Asymptomatic and longstanding.  Will increase losartan to 100 mg daily and check BMP on follow-up visit.  Continue Norvasc 10 mg and metoprolol XR 25 mg.  Instructed to bring his BP cuff and journal to next visit.

## 2020-06-16 ENCOUNTER — Other Ambulatory Visit: Payer: Self-pay

## 2020-06-16 ENCOUNTER — Institutional Professional Consult (permissible substitution) (INDEPENDENT_AMBULATORY_CARE_PROVIDER_SITE_OTHER): Payer: Self-pay | Admitting: Cardiothoracic Surgery

## 2020-06-16 ENCOUNTER — Telehealth: Payer: Self-pay

## 2020-06-16 VITALS — BP 140/95 | HR 95 | Temp 97.6°F | Resp 20 | Ht 69.0 in | Wt 225.0 lb

## 2020-06-16 DIAGNOSIS — I712 Thoracic aortic aneurysm, without rupture, unspecified: Secondary | ICD-10-CM

## 2020-06-16 DIAGNOSIS — N529 Male erectile dysfunction, unspecified: Secondary | ICD-10-CM

## 2020-06-16 MED ORDER — SILDENAFIL CITRATE 50 MG PO TABS: 50.0000 mg | ORAL_TABLET | Freq: Every day | ORAL | 0 refills | Status: DC | PRN

## 2020-06-16 NOTE — Telephone Encounter (Signed)
Patient calls nurse line regarding sildenafil rx. Patient reports that he does not currently have insurance and was trying to use goodrx coupon for prescription. At [facility], rx would cost over $300, however, patient could get from [facility] for ~$10.00.   Per patient request, called and canceled rx at [facility] (spoke with [place]). Resent rx to patient's preferred pharmacy ([facility] on [address]).   FYI to prescriber.   Talbot Grumbling, RN

## 2020-06-21 ENCOUNTER — Other Ambulatory Visit: Payer: Self-pay

## 2020-06-21 ENCOUNTER — Ambulatory Visit (INDEPENDENT_AMBULATORY_CARE_PROVIDER_SITE_OTHER): Payer: Self-pay | Admitting: Family Medicine

## 2020-06-21 ENCOUNTER — Encounter: Payer: Self-pay | Admitting: Family Medicine

## 2020-06-21 DIAGNOSIS — I10 Essential (primary) hypertension: Secondary | ICD-10-CM

## 2020-06-21 DIAGNOSIS — G44209 Tension-type headache, unspecified, not intractable: Secondary | ICD-10-CM

## 2020-06-21 DIAGNOSIS — L281 Prurigo nodularis: Secondary | ICD-10-CM

## 2020-06-21 HISTORY — DX: Tension-type headache, unspecified, not intractable: G44.209

## 2020-06-21 MED ORDER — METOPROLOL SUCCINATE ER 25 MG PO TB24: 25.0000 mg | ORAL_TABLET | Freq: Every day | ORAL | 0 refills | Status: DC

## 2020-06-21 MED ORDER — AMLODIPINE BESYLATE 10 MG PO TABS: 10.0000 mg | ORAL_TABLET | Freq: Every day | ORAL | 11 refills | Status: DC

## 2020-06-21 MED ORDER — ATORVASTATIN CALCIUM 40 MG PO TABS: 40.0000 mg | ORAL_TABLET | Freq: Every day | ORAL | 0 refills | Status: DC

## 2020-06-21 MED ORDER — HYDROCORTISONE 1 % EX LOTN: 1.0000 "application " | TOPICAL_LOTION | Freq: Two times a day (BID) | CUTANEOUS | 0 refills | Status: AC

## 2020-06-21 NOTE — Patient Instructions (Signed)
Thank you for coming to see me today. It was a pleasure. Today we discussed your rash. I am prescribing hydrocortisone cream which is a steroid. Please also take zyrtec once daily. Avoid scratching the area and wear gloves at night . BP is still high, likely due to stress. Monitor the BP at home and keep a diary. Please bring this diary at the next viis  Please follow-up with me in 2 weeks, we might need to add on another BP medication then.   If you have any questions or concerns, please do not hesitate to call the office at [phone].  Best wishes,   Dr Posey Pronto

## 2020-06-21 NOTE — Assessment & Plan Note (Signed)
Patient experiencing tension headache likely due to stress and lack of sleep. He has a 78 month old son at home.  Cranial nerve exam normal, peripheral nerve exam normal.  Recommended Tylenol for the headache.  If headache changes in nature or worsens in severity recommended evaluation in the ER.  Patient expressed understanding.

## 2020-06-21 NOTE — Assessment & Plan Note (Signed)
BP 166/130, repeat BP 140/90.  Slightly elevated due to patient being stressed.  Blood pressures at home have been in the 130s to 140s which is much improved from prior.  We will hold off on prescribing another antihypertensive for today.  Recommended patient continues to keep a blood pressure diary at home and brings [place] to the follow-up in 2 weeks with myself.  If persistently elevated will add on HCTZ.

## 2020-06-21 NOTE — Assessment & Plan Note (Addendum)
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  Recommended trial of 1% hydrocortisone cream to apply to affected area and Zyrtec daily.  Follow-up in 2 weeks time.

## 2020-06-21 NOTE — Progress Notes (Signed)
SUBJECTIVE:   CHIEF COMPLAINT / HPI:   Thomas Spears is a 45 y.o. male presents for blood pressure follow up    Hypertension Patient's current antihypertensive  medications include: losartan 100 mg, Norvasc 10 mg and metoprolol XR 25 mg. Compliant with medications and tolerating well without side effects.  Checking BP at home with readings between 130 and 140. Has been stressed recently about bills. Denies any SOB, CP, vision changes, LE edema, medication SEs, or symptoms of hypotension.   Most recent creatinine trend:  Lab Results  Component Value Date   CREATININE 1.38 (H) 05/31/2020   CREATININE 1.41 (H) 05/27/2020   CREATININE 1.48 (H) 05/23/2020     Patient has had a BMP in the past 1 year.  Headache  Onset: woke up with it this morning  Location: occipital  Quality: not severe  Frequency: intermittent  Precipitating factors: sleep, stress  Prior treatment: none  Severity: 4/10  Associated Symptoms Nausea/vomiting: no  Photophobia/phonophobia: no  Tearing of eyes: no  Sinus pain/pressure: no  Family hx migraine: brother had migraines  Personal stressors: yes  Relation to menstrual cycle: n/a  Red Flags Fever: no  Neck pain/stiffness: no  Vision/speech/swallow/hearing difficulty: no  Focal weakness/numbness: yes-numbness in left arm and left foot. Altered mental status: no  Trauma: no  New type of headache: no  Anticoagulant use: no  H/o cancer/HIV/Pregnancy: no   Rash  Pruritic rash started 3 days ago over arms and back. Has not taken anything for it. No history of similar rashes. Went into the woods yesterday but the rash started before then. No hx of eczema or dermatitis. No sick contacts. No insect bites, changes in laundry detergent.   [facility] Office Visit from 06/21/2020 in [place]  PHQ-9 Total Score 0       Health Maintenance Due  Topic  . Hepatitis C Screening   . TETANUS/TDAP       PERTINENT  PMH /  PSH: HFrEF, ED, HTN   OBJECTIVE:   BP (!) 166/130   Pulse (!) 106   Ht 5\' 9"  (1.753 m)   Wt 225 lb 6.4 oz (102.2 kg)   SpO2 99%   BMI 33.29 kg/m    General: Alert, no acute distress Cardio: Well-perfused Pulm: normal work of breathing Extremities: No peripheral edema.  Neuro: Cranial nerve exam normal, upper and lower extremity strength 5/5, normal sensation throughout lower extremities, DTR + Skin: Pruritic, firm, pink papular rash with dome shaped nodules (few mm each) over upper back and shoulders.       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  Recommended trial of 1% hydrocortisone cream to apply to affected area and Zyrtec daily.  Follow-up in 2 weeks time.  Severe uncontrolled hypertension BP 166/130, repeat BP 140/90.  Slightly elevated due to patient being stressed.  Blood pressures at home have been in the 130s to 140s which is much improved from prior.  We will hold off on prescribing another antihypertensive for today.  Recommended patient continues to keep a blood pressure diary at home and brings [place] to the follow-up in 2 weeks with myself.  If persistently elevated will add on HCTZ.   Tension headache Patient experiencing tension headache likely due to stress and lack of sleep. He has a 73 month old son at home.  Cranial nerve exam normal, peripheral nerve exam normal.  Recommended Tylenol for the headache.  If headache changes in  nature or worsens in severity recommended evaluation in the ER.  Patient expressed understanding.     Lattie Haw, MD PGY-2 [place]

## 2020-07-13 ENCOUNTER — Ambulatory Visit (INDEPENDENT_AMBULATORY_CARE_PROVIDER_SITE_OTHER): Payer: Self-pay | Admitting: Family Medicine

## 2020-07-13 ENCOUNTER — Encounter: Payer: Self-pay | Admitting: Family Medicine

## 2020-07-13 ENCOUNTER — Ambulatory Visit (HOSPITAL_COMMUNITY)
Admission: RE | Admit: 2020-07-13 | Discharge: 2020-07-13 | Disposition: A | Discharge status: Home / Self Care | Payer: Self-pay | Source: Ambulatory Visit | Attending: Family Medicine | Admitting: Family Medicine

## 2020-07-13 ENCOUNTER — Ambulatory Visit: Payer: Self-pay | Admitting: Family Medicine

## 2020-07-13 ENCOUNTER — Other Ambulatory Visit: Payer: Self-pay

## 2020-07-13 VITALS — BP 142/80 | HR 88 | Ht 69.5 in | Wt 225.6 lb

## 2020-07-13 DIAGNOSIS — R0789 Other chest pain: Secondary | ICD-10-CM | POA: Insufficient documentation

## 2020-07-13 DIAGNOSIS — I5020 Unspecified systolic (congestive) heart failure: Secondary | ICD-10-CM

## 2020-07-13 DIAGNOSIS — I10 Essential (primary) hypertension: Secondary | ICD-10-CM

## 2020-07-13 DIAGNOSIS — R079 Chest pain, unspecified: Secondary | ICD-10-CM | POA: Insufficient documentation

## 2020-07-13 MED ORDER — LOSARTAN POTASSIUM 100 MG PO TABS: 100.0000 mg | ORAL_TABLET | Freq: Every day | ORAL | 1 refills | Status: DC

## 2020-07-13 MED ORDER — INDAPAMIDE 1.25 MG PO TABS: 1.2500 mg | ORAL_TABLET | Freq: Every day | ORAL | 0 refills | Status: DC

## 2020-07-13 NOTE — Progress Notes (Signed)
SUBJECTIVE:   CHIEF COMPLAINT / HPI: HTN  Thomas Spears is a 45 year old gentleman presenting for follow-up.  Hypertension: Blood pressure has been poorly controlled at home.  Home journal showing average 562-130 systolic, 865-784 diastolic.  He recently got a new home blood pressure cuff states it will not let him take the blood pressure unless the cuff is fitting appropriately.  Taking in the morning when he wakes up.  He is exercising and is adherent to a low-salt diet. He currently takes Norvasc 10 mg, metoprolol XR 25 mg, and losartan 100 mg daily and is adherent to regimen.  Cardiac symptoms: chest pain and lower extremity edema. Patient denies: claudication, dyspnea, exertional chest pressure/discomfort and fatigue. Cardiovascular risk factors: hypertension and male gender. Use of agents associated with hypertension: none. History of target organ damage: chronic kidney disease, heart failure and left ventricular hypertrophy.  Reports occasional bilateral lower extremity edema, better in the morning.  Seems to come and go, none currently.  HFrEF: Awaiting cardiology evaluation on 5/27.  Chest tightness: Tightness sensation only, no pressure.  Pain 5/10 when it occurs "It doesn't feel bad."  1-2 times weekly past 3 weeks.  Only seems to be at night, either about 30 minutes after he eats dinner or more noticed in the middle of the night when he gets up to go to the bathroom.  Lasting about 30 minutes - 1 hour and then will spontaneously resolve.  Has not tried anything to make it better.  Denies any associated diaphoresis, shortness of breath, numbness/tingling.  Walking 30-45 minutes everyday at a fast pace, denies any chest pain, shortness of breath during that time.  Tobacco use: down to 2 cigarettes daily. He also cut energy drinks out of his diet two weeks ago, previously 2 daily.  Also previously drank about 5 [facility] a day, now down to 2 sodas daily.  PERTINENT  PMH / PSH: HFrEF  25%, severe hypertension, COVID-19 in 04/2020  OBJECTIVE:   BP (!) 142/80   Pulse 88   Ht 5' 9.5" (1.765 m)   Wt 225 lb 9.6 oz (102.3 kg)   SpO2 98%   BMI 32.84 kg/m   General: Alert, NAD HEENT: NCAT, MMM, no JVD Cardiac: RRR no m/g/r Lungs: Clear bilaterally, no increased WOB, no crackles or rales present. Abdomen: soft, non-tender Msk: Moves all extremities spontaneously, normal gait Ext: Warm, dry, 2+ distal pulses (2+ DP pulses), no edema bilaterally  ASSESSMENT/PLAN:   Severe uncontrolled hypertension Currently uncontrolled, SBP 150-170s, DBP 100-120s at home.  Will add diuretic like antihypertensive, Rx indapamide 1.25 mg and to increase to 2.5 mg if tolerating after the next several days.  Continue Norvasc 10 mg, losartan 100 mg, and metoprolol XR 25 mg as is.  Continue monitoring BP at home and bring in BP cuff on follow-up visit.  HFrEF (heart failure with reduced ejection fraction) (HCC) Still overall [place] class I, very functional.  Severe LVH and EF 25% on echo in 04/2020 with longstanding poorly controlled hypertension.  Overall euvolemic, starting indapamide as mentioned above.  Continue losartan, metoprolol, ASA, and Lipitor.  Maintain initial cardiology evaluation on 5/27.  Chest pain Atypical, present intermittently for the past three weeks and only appears to occur after dinner and at night.  Able to maintain >4 METS without CP/SOB while walking every day.  EKG abnormal, however similar to previous with LVH and T wave inversions in lateral leads.  While atypical, overall concerning for cardiac etiology given his  known severe cardiomyopathy and poorly controlled hypertension with already suspected CAD.  However, could also consider GERD as appears to happen more frequently at night/after dinner.  Recommended evaluation in the ED, however patient declined.  Encourage follow-up in the ED if chest pain recurs and can trial Tums to assess resolution, follow-up with cardiology on  5/27.      Discussed with Dr. Owens Shark.  Follow-up with cardiology on 5/27, then in approximately 2 weeks with our clinic or sooner if needed.  Patriciaann Clan, [place]

## 2020-07-13 NOTE — Assessment & Plan Note (Addendum)
Atypical, present intermittently for the past three weeks and only appears to occur after dinner and at night.  Able to maintain >4 METS without CP/SOB while walking every day.  EKG abnormal, however similar to previous with LVH and T wave inversions in lateral leads.  While atypical, overall concerning for cardiac etiology given his known severe cardiomyopathy and poorly controlled hypertension with already suspected CAD.  However, could also consider GERD as appears to happen more frequently at night/after dinner.  Recommended evaluation in the ED, however patient declined.  Encourage follow-up in the ED if chest pain recurs and can trial Tums to assess resolution, follow-up with cardiology on 5/27.

## 2020-07-13 NOTE — Patient Instructions (Signed)
It was wonderful to see you today.  We have started a new medication called indapamide.  We will start with 1.25 mg which is 1 tablet and then increase to 2 tablets of your blood pressure still elevated and you are tolerating the medication well.  Goal blood pressure for you would be <130/80.   Please if you have any recurrence of this chest pain, please go to the ED.  It is fine to try some Tums but then still present for evaluation.  Make sure you make your appointment with your cardiologist as scheduled.  This is very important that you see them.

## 2020-07-13 NOTE — Assessment & Plan Note (Signed)
Currently uncontrolled, SBP 150-170s, DBP 100-120s at home.  Will add diuretic like antihypertensive, Rx indapamide 1.25 mg and to increase to 2.5 mg if tolerating after the next several days.  Continue Norvasc 10 mg, losartan 100 mg, and metoprolol XR 25 mg as is.  Continue monitoring BP at home and bring in BP cuff on follow-up visit.

## 2020-07-13 NOTE — Assessment & Plan Note (Signed)
Still overall [place] class I, very functional.  Severe LVH and EF 25% on echo in 04/2020 with longstanding poorly controlled hypertension.  Overall euvolemic, starting indapamide as mentioned above.  Continue losartan, metoprolol, ASA, and Lipitor.  Maintain initial cardiology evaluation on 5/27.

## 2020-07-19 ENCOUNTER — Other Ambulatory Visit: Payer: Self-pay | Admitting: Family Medicine

## 2020-07-21 NOTE — Progress Notes (Deleted)
Cardiology Office Note:    Date:  07/21/2020   ID:  Thomas Spears, DOB Jul 21, 1975, MRN [id number]  PCP:  Lattie Haw, MD   [facility] Providers Cardiologist:  None {    Referring MD: Lattie Haw, MD    History of Present Illness:    Thomas Spears is a 45 y.o. male with a hx of systolic heart failure with LVEF 25% with global hypokinesis, HTN, mild-to-moderate MR, severe aortic dilation measuring 37mm, and LVH who was referred by Dr. Posey Pronto for further evaluation of systolic heart failure.  Past Medical History:  Diagnosis Date  . Community acquired pneumonia   . Hypertension   . Hypertensive crisis 05/22/2020  . Tension headache 06/21/2020    Past Surgical History:  Procedure Laterality Date  . CAROTID STENT INSERTION      Current Medications: No outpatient medications have been marked as taking for the 07/22/20 encounter (Appointment) with Freada Bergeron, MD.     Allergies:   Mushroom extract complex   Social History   Socioeconomic History  . Marital status: Single    Spouse name: Not on file  . Number of children: Not on file  . Years of education: Not on file  . Highest education level: Not on file  Occupational History  . Not on file  Tobacco Use  . Smoking status: Light Tobacco Smoker  . Smokeless tobacco: Never Used  Substance and Sexual Activity  . Alcohol use: Not Currently  . Drug use: Not on file  . Sexual activity: Yes  Other Topics Concern  . Not on file  Social History Narrative  . Not on file   Social Determinants of Health   Financial Resource Strain: Not on file  Food Insecurity: Not on file  Transportation Needs: Not on file  Physical Activity: Not on file  Stress: Not on file  Social Connections: Not on file     Family History: The patient's ***family history is not on file.  ROS:   Please see the history of present illness.    *** All other systems reviewed and are negative.  EKGs/Labs/Other Studies  Reviewed:    The following studies were reviewed today: TTE 06-12-2020: IMPRESSIONS  1. Left ventricular ejection fraction, by estimation, is 25%. The left  ventricle has severely decreased function. The left ventricle demonstrates  global hypokinesis. The left ventricular internal cavity size was mildly  dilated. There is severe left  ventricular hypertrophy raising concern cardiac amyloidosis versus  long-standing severe hypertension. Left ventricular diastolic parameters  are consistent with Grade II diastolic dysfunction (pseudonormalization).  2. Right ventricular systolic function is mildly reduced. The right  ventricular size is normal.  3. Left atrial size was severely dilated.  4. Right atrial size was moderately dilated.  5. The mitral valve is normal in structure. Mild to moderate mitral valve  regurgitation. No evidence of mitral stenosis.  6. The aortic valve is tricuspid. Aortic valve regurgitation is trivial.  No aortic stenosis is present.  7. Aortic dilatation noted. There is severe dilatation of the ascending  aorta, measuring 50 mm.  8. The inferior vena cava is normal in size with <50% respiratory  variability, suggesting right atrial pressure of 8 mmHg.  9. A small pericardial effusion is present.   CTA 05/22/20: IMPRESSION: 1. No evidence of acute pulmonary embolism. 2. Multifocal patchy airspace opacity within the right lung most pronounced within the right upper lobe. Findings are concerning for multifocal pneumonia. Follow-up to resolution is recommended. 3.  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 Recommend semi-annual imaging followup by CTA or MRA and referral to cardiothoracic surgery if not already obtained. This recommendation follows 2010 ACCF/AHA/AATS/ACR/ASA/SCA/SCAI/SIR/STS/SVM Guidelines for the Diagnosis and Management of Patients With Thoracic Aortic Disease. Circulation. 2010; 121: X324-M010. Aortic  aneurysm NOS (ICD10-I71.9) 4. Marked four-chamber cardiomegaly with marked left ventricular hypertrophy. 5. Dilated main pulmonary trunk measuring 4.1 cm in diameter, suggesting pulmonary arterial hypertension. 6. Mildly enlarged mediastinal and right hilar lymph nodes, likely reactive.  EKG:  EKG is *** ordered today.  The ekg ordered today demonstrates ***  Recent Labs: 05/22/2020: B Natriuretic Peptide 3,522.0 05/23/2020: ALT 55; Hemoglobin 13.3; Platelets 298; TSH 0.400 05/31/2020: BUN 24; Creatinine, Ser 1.38; Potassium 4.6; Sodium 140  Recent Lipid Panel    Component Value Date/Time   CHOL 137 05/23/2020 0239   TRIG 80 05/23/2020 0239   HDL 25 (L) 05/23/2020 0239   CHOLHDL 5.5 05/23/2020 0239   VLDL 16 05/23/2020 0239   LDLCALC 96 05/23/2020 0239     Risk Assessment/Calculations:   {Does this patient have ATRIAL FIBRILLATION?:[id number]}   Physical Exam:    VS:  There were no vitals taken for this visit.    Wt Readings from Last 3 Encounters:  07/13/20 225 lb 9.6 oz (102.3 kg)  06/21/20 225 lb 6.4 oz (102.2 kg)  06/16/20 225 lb (102.1 kg)     GEN: *** Well nourished, well developed in no acute distress HEENT: Normal NECK: No JVD; No carotid bruits LYMPHATICS: No lymphadenopathy CARDIAC: ***RRR, no murmurs, rubs, gallops RESPIRATORY:  Clear to auscultation without rales, wheezing or rhonchi  ABDOMEN: Soft, non-tender, non-distended MUSCULOSKELETAL:  No edema; No deformity  SKIN: Warm and dry NEUROLOGIC:  Alert and oriented x 3 PSYCHIATRIC:  Normal affect   ASSESSMENT:    No diagnosis found. PLAN:    In order of problems listed above:  #Systolic Heart Failure: TTE with LVEF 25% with global hypokinesis, severe LVH, G2DD. Has not had ischemic work-up. Possibly due to severe HTN but also concerned for amyloidosis. -Needs cardiac MRI -Cath?? -Change losartan to entresto 24/26mg  BID -Continue metoprolol 25mg  XL daily -Start spironolactone and farxiga as  able -Low Na diet  #HTN with severe LVH: -Continue amlodipine 10mg  daily -Continue losartan 100mg  daily -Continue metoprolol 25mg  XL daily  #Severe ascending aortic dilation: -Refer to CT surgery -Needs aggressive blood pressure control   {Are you ordering a CV Procedure (e.g. stress test, cath, DCCV, TEE, etc)?   Press F2        :[id number]}    Medication Adjustments/Labs and Tests Ordered: Current medicines are reviewed at length with the patient today.  Concerns regarding medicines are outlined above.  No orders of the defined types were placed in this encounter.  No orders of the defined types were placed in this encounter.   There are no Patient Instructions on file for this visit.   Signed, Freada Bergeron, MD  07/21/2020 6:02 PM     [facility]

## 2020-07-22 ENCOUNTER — Other Ambulatory Visit: Payer: Self-pay

## 2020-07-22 ENCOUNTER — Encounter: Payer: Self-pay | Admitting: Cardiology

## 2020-07-22 ENCOUNTER — Ambulatory Visit (INDEPENDENT_AMBULATORY_CARE_PROVIDER_SITE_OTHER): Payer: Self-pay | Admitting: Cardiology

## 2020-07-22 VITALS — BP 142/94 | HR 93 | Ht 69.5 in | Wt 224.2 lb

## 2020-07-22 DIAGNOSIS — Z8249 Family history of ischemic heart disease and other diseases of the circulatory system: Secondary | ICD-10-CM

## 2020-07-22 DIAGNOSIS — I517 Cardiomegaly: Secondary | ICD-10-CM

## 2020-07-22 DIAGNOSIS — Z79899 Other long term (current) drug therapy: Secondary | ICD-10-CM

## 2020-07-22 DIAGNOSIS — I10 Essential (primary) hypertension: Secondary | ICD-10-CM

## 2020-07-22 DIAGNOSIS — I712 Thoracic aortic aneurysm, without rupture: Secondary | ICD-10-CM

## 2020-07-22 DIAGNOSIS — I7121 Aneurysm of the ascending aorta, without rupture: Secondary | ICD-10-CM

## 2020-07-22 DIAGNOSIS — I5020 Unspecified systolic (congestive) heart failure: Secondary | ICD-10-CM

## 2020-07-22 MED ORDER — ENTRESTO 24-26 MG PO TABS: 1.0000 | ORAL_TABLET | Freq: Two times a day (BID) | ORAL | 3 refills | Status: DC

## 2020-07-22 MED ORDER — CARVEDILOL 6.25 MG PO TABS: 6.2500 mg | ORAL_TABLET | Freq: Two times a day (BID) | ORAL | 3 refills | Status: DC

## 2020-07-22 NOTE — Patient Instructions (Addendum)
Medication Instructions:  STOP Losartan STOP Metoprolol START Entresto 24/26mg  twice a day by mouth START Coreg 6.25mg  twice a day by mouth *If you need a refill on your cardiac medications before your next appointment, please call your pharmacy*   Lab Work: CBC and BMET to be done at time of Cardiac stress test BMET in 2 weeks for medication management of Entresto. If you have labs (blood work) drawn today and your tests are completely normal, you will receive your results only by: Marland Kitchen MyChart Message (if you have MyChart) OR . A paper copy in the mail If you have any lab test that is abnormal or we need to change your treatment, we will call you to review the results.   Testing/Procedures: Your provider recommends you have a cardiac MRI stress test.    Follow-Up: At [facility], you and your health needs are our priority.  As part of our continuing mission to provide you with exceptional heart care, we have created designated Provider Care Teams.  These Care Teams include your primary Cardiologist (physician) and Advanced Practice Providers (APPs -  Physician Assistants and Nurse Practitioners) who all work together to provide you with the care you need, when you need it.  We recommend signing up for the patient portal called "MyChart".  Sign up information is provided on this After Visit Summary.  MyChart is used to connect with patients for Virtual Visits (Telemedicine).  Patients are able to view lab/test results, encounter notes, upcoming appointments, etc.  Non-urgent messages can be sent to your provider as well.   To learn more about what you can do with MyChart, go to [email or web address].    Your next appointment:   2-3 week(s)  The format for your next appointment:   In Person  Provider:      Other Instructions Referral has been made to Hypertension clinic in this office. Mediterranean Diet A Mediterranean diet refers to food and lifestyle choices that are  based on the traditions of countries located on the [employer]. This way of eating has been shown to help prevent certain conditions and improve outcomes for people who have chronic diseases, like kidney disease and heart disease. What are tips for following this plan? Lifestyle  Cook and eat meals together with your family, when possible.  Drink enough fluid to keep your urine clear or pale yellow.  Be physically active every day. This includes: ? Aerobic exercise like running or swimming. ? Leisure activities like gardening, walking, or housework.  Get 7-8 hours of sleep each night.  If recommended by your health care provider, drink red wine in moderation. This means 1 glass a day for nonpregnant women and 2 glasses a day for men. A glass of wine equals 5 oz (150 mL). Reading food labels  Check the serving size of packaged foods. For foods such as rice and pasta, the serving size refers to the amount of cooked product, not dry.  Check the total fat in packaged foods. Avoid foods that have saturated fat or trans fats.  Check the ingredients list for added sugars, such as corn syrup.   Shopping  At the grocery store, buy most of your food from the areas near the walls of the store. This includes: ? Fresh fruits and vegetables (produce). ? Grains, beans, nuts, and seeds. Some of these may be available in unpackaged forms or large amounts (in bulk). ? Fresh seafood. ? Poultry and eggs. ? Low-fat dairy products.  Buy whole ingredients  instead of prepackaged foods.  Buy fresh fruits and vegetables in-season from local farmers markets.  Buy frozen fruits and vegetables in resealable bags.  If you do not have access to quality fresh seafood, buy precooked frozen shrimp or canned fish, such as tuna, salmon, or sardines.  Buy small amounts of raw or cooked vegetables, salads, or olives from the deli or salad bar at your store.  Stock your pantry so you always have certain  foods on hand, such as olive oil, canned tuna, canned tomatoes, rice, pasta, and beans. Cooking  Cook foods with extra-virgin olive oil instead of using butter or other vegetable oils.  Have meat as a side dish, and have vegetables or grains as your main dish. This means having meat in small portions or adding small amounts of meat to foods like pasta or stew.  Use beans or vegetables instead of meat in common dishes like chili or lasagna.  Experiment with different cooking methods. Try roasting or broiling vegetables instead of steaming or sauteing them.  Add frozen vegetables to soups, stews, pasta, or rice.  Add nuts or seeds for added healthy fat at each meal. You can add these to yogurt, salads, or vegetable dishes.  Marinate fish or vegetables using olive oil, lemon juice, garlic, and fresh herbs. Meal planning  Plan to eat 1 vegetarian meal one day each week. Try to work up to 2 vegetarian meals, if possible.  Eat seafood 2 or more times a week.  Have healthy snacks readily available, such as: ? Vegetable sticks with hummus. ? [place] yogurt. ? Fruit and nut trail mix.  Eat balanced meals throughout the week. This includes: ? Fruit: 2-3 servings a day ? Vegetables: 4-5 servings a day ? Low-fat dairy: 2 servings a day ? Fish, poultry, or lean meat: 1 serving a day ? Beans and legumes: 2 or more servings a week ? Nuts and seeds: 1-2 servings a day ? Whole grains: 6-8 servings a day ? Extra-virgin olive oil: 3-4 servings a day  Limit red meat and sweets to only a few servings a month   What are my food choices?  Mediterranean diet ? Recommended  Grains: Whole-grain pasta. Brown rice. Bulgar wheat. Polenta. Couscous. Whole-wheat bread. [place].  Vegetables: Artichokes. Beets. Broccoli. Cabbage. Carrots. Eggplant. Green beans. Chard. Kale. Spinach. Onions. Leeks. Peas. Squash. Tomatoes. Peppers. Radishes.  Fruits: Apples. Apricots. Avocado. Berries. Bananas.  [place]. Dates. Figs. Grapes. Lemons. Melon. Oranges. Peaches. Plums. Pomegranate.  Meats and other protein foods: Beans. Almonds. Sunflower seeds. Pine nuts. Peanuts. [place]. Salmon. Scallops. Shrimp. [place]. Tilapia. Clams. Oysters. Eggs.  Dairy: Low-fat milk. Cheese. Greek yogurt.  Beverages: Water. Red wine. Herbal tea.  Fats and oils: Extra virgin olive oil. Avocado oil. Grape seed oil.  Sweets and desserts: [place] yogurt with honey. Baked apples. Poached pears. Trail mix.  Seasoning and other foods: Basil. Cilantro. Coriander. Cumin. Mint. Parsley. Sage. Rosemary. Tarragon. Garlic. Oregano. Thyme. Pepper. Balsalmic vinegar. Tahini. Hummus. Tomato sauce. Olives. Mushrooms. ? Limit these  Grains: Prepackaged pasta or rice dishes. Prepackaged cereal with added sugar.  Vegetables: Deep fried potatoes (french fries).  Fruits: Fruit canned in syrup.  Meats and other protein foods: Beef. Pork. Lamb. Poultry with skin. Hot dogs. [place] Salines.  Dairy: Ice cream. Sour cream. Whole milk.  Beverages: Juice. Sugar-sweetened soft drinks. Beer. Liquor and spirits.  Fats and oils: Butter. Canola oil. Vegetable oil. Beef fat (tallow). Lard.  Sweets and desserts: Cookies. Cakes. Pies. Candy.  Seasoning and other  foods: Mayonnaise. Premade sauces and marinades. The items listed may not be a complete list. Talk with your dietitian about what dietary choices are right for you. Summary  The Mediterranean diet includes both food and lifestyle choices.  Eat a variety of fresh fruits and vegetables, beans, nuts, seeds, and whole grains.  Limit the amount of red meat and sweets that you eat.  Talk with your health care provider about whether it is safe for you to drink red wine in moderation. This means 1 glass a day for nonpregnant women and 2 glasses a day for men. A glass of wine equals 5 oz (150 mL). This information is not intended to replace advice given to you by your health care provider. Make sure  you discuss any questions you have with your health care provider. Document Revised: 10/13/2015 Document Reviewed: 10/06/2015 Elsevier Patient Education  [place].

## 2020-07-22 NOTE — Progress Notes (Signed)
Cardiology Office Note:    Date:  07/22/2020   ID:  Thomas Spears, DOB Oct 23, 1975, MRN [id number]  PCP:  Lattie Haw, MD   [facility] Providers Cardiologist:  None {    Referring MD: Lattie Haw, MD    History of Present Illness:    Thomas Spears is a 45 y.o. male with a hx of newly diagnosed systolic heart failure with LVEF 25% with global hypokinesis, HTN, mild-to-moderate MR, severe aortic dilation measuring 34mm, and LVH who was referred by Dr. Posey Pronto for further evaluation of systolic heart failure.  The patient was hospitalized at [facility] from 05/22/20-05/23/20 with hypertensive crisis where he presented with dyspnea and HA found to have Bps 220s/150s. CTA chest showed no PE but demonstrated thoracic aortic aneurysm 4.7cm as well as evidence of pulmonary HTN. He was placed on a nitro gtt. Trop 400>433. TTE 05/23/20 showed LVEF 25% with global hypokinesis, severe LVH, G2DD, mild RV dysfunction, severe LAE, moderate RAE, mild-to-moderate MR, trivial AI, severe aortic dilation, small pericardial effusion. Prior to completed work-up, the patient left AMA.   The patient presents to Cardiology clinic today given the echo findings as detailed above. He has not followed with a Cardiologist. Today, he feels well.  He admits that for several years he was not taking his medication and his hypertension was uncontrolled. Also, he was regularly drinking energy drinks/sodas and smoking 2 packs of cigarettes per day. Currently he is down to 6 cigarettes a day. He also used to be a heavy drinker. 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 He is currently taking all his blood pressure medications including losartan 100mg  daily, metoprolol succinate 25mg  daily, amlodipine 5mg  daily with much improvement in his blood pressures to 140s. He denies any chest pain, shortness of breath, palpitations, or exertional symptoms. No headaches, lightheadedness, or syncope to report. Also has no orthopnea  or PND. He denies any bleeding issues. Has mild pedal edema for which he was started on indapamide 1.25mg  daily.  Has a strong family history of CAD: his father had CABGx2 in his 55's-50's. Hypertension is also present on both maternal and paternal sides of his family. He denies any history of aneurysms or kidney disease.  Past Medical History:  Diagnosis Date  . Community acquired pneumonia   . Hypertension   . Hypertensive crisis 05/22/2020  . Tension headache 06/21/2020    Past Surgical History:  Procedure Laterality Date  . CAROTID STENT INSERTION      Current Medications: Current Meds  Medication Sig  . amLODipine (NORVASC) 10 MG tablet Take 1 tablet (10 mg total) by mouth daily.  Marland Kitchen aspirin EC 81 MG tablet Take 81 mg by mouth daily. Swallow whole.  Marland Kitchen atorvastatin (LIPITOR) 40 MG tablet TAKE 1 TABLET(40 MG) BY MOUTH DAILY  . carvedilol (COREG) 6.25 MG tablet Take 1 tablet (6.25 mg total) by mouth 2 (two) times daily.  . indapamide (LOZOL) 1.25 MG tablet Take 1 tablet (1.25 mg total) by mouth daily. Increase to two tablets if tolerating well in several days.  . sacubitril-valsartan (ENTRESTO) 24-26 MG Take 1 tablet by mouth 2 (two) times daily.  . [DISCONTINUED] losartan (COZAAR) 100 MG tablet Take 1 tablet (100 mg total) by mouth at bedtime.  . [DISCONTINUED] metoprolol succinate (TOPROL-XL) 25 MG 24 hr tablet TAKE 1 TABLET(25 MG) BY MOUTH DAILY     Allergies:   Mushroom extract complex   Social History   Socioeconomic History  . Marital status: Single  Spouse name: Not on file  . Number of children: Not on file  . Years of education: Not on file  . Highest education level: Not on file  Occupational History  . Not on file  Tobacco Use  . Smoking status: Light Tobacco Smoker  . Smokeless tobacco: Never Used  Substance and Sexual Activity  . Alcohol use: Not Currently  . Drug use: Not on file  . Sexual activity: Yes  Other Topics Concern  . Not on file  Social  History Narrative  . Not on file   Social Determinants of Health   Financial Resource Strain: Not on file  Food Insecurity: Not on file  Transportation Needs: Not on file  Physical Activity: Not on file  Stress: Not on file  Social Connections: Not on file     Family History: The patient's family history is not on file.  ROS:   Please see the history of present illness.    Review of Systems  Constitutional: Negative for fever and weight loss.  HENT: Negative for nosebleeds and sinus pain.   Eyes: Negative for photophobia and pain.  Respiratory: Negative for hemoptysis, wheezing and stridor.   Cardiovascular: Positive for leg swelling. Negative for chest pain, palpitations, orthopnea, claudication and PND.  Gastrointestinal: Negative for abdominal pain, constipation and vomiting.  Genitourinary: Negative for frequency and urgency.  Musculoskeletal: Negative for falls, myalgias and neck pain.  Neurological: Positive for tingling. Negative for seizures and headaches.  Endo/Heme/Allergies: Does not bruise/bleed easily.  Psychiatric/Behavioral: The patient is not nervous/anxious and does not have insomnia.      EKGs/Labs/Other Studies Reviewed:    The following studies were reviewed today:  TTE 05/23/20: IMPRESSIONS  1. Left ventricular ejection fraction, by estimation, is 25%. The left  ventricle has severely decreased function. The left ventricle demonstrates  global hypokinesis. The left ventricular internal cavity size was mildly  dilated. There is severe left  ventricular hypertrophy raising concern cardiac amyloidosis versus  long-standing severe hypertension. Left ventricular diastolic parameters  are consistent with Grade II diastolic dysfunction (pseudonormalization).  2. Right ventricular systolic function is mildly reduced. The right  ventricular size is normal.  3. Left atrial size was severely dilated.  4. Right atrial size was moderately dilated.  5. The  mitral valve is normal in structure. Mild to moderate mitral valve  regurgitation. No evidence of mitral stenosis.  6. The aortic valve is tricuspid. Aortic valve regurgitation is trivial.  No aortic stenosis is present.  7. Aortic dilatation noted. There is severe dilatation of the ascending  aorta, measuring 50 mm.  8. The inferior vena cava is normal in size with <50% respiratory  variability, suggesting right atrial pressure of 8 mmHg.  9. A small pericardial effusion is present.   CTA 05/22/20: IMPRESSION: 1. No evidence of acute pulmonary embolism. 2. Multifocal patchy airspace opacity within the right lung most pronounced within the right upper lobe. Findings are concerning for multifocal pneumonia. Follow-up to resolution is recommended. 3. 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 Recommend semi-annual imaging followup by CTA or MRA and referral to cardiothoracic surgery if not already obtained. This recommendation follows 2010 ACCF/AHA/AATS/ACR/ASA/SCA/SCAI/SIR/STS/SVM Guidelines for the Diagnosis and Management of Patients With Thoracic Aortic Disease. Circulation. 2010; 121: C376-E831. Aortic aneurysm NOS (ICD10-I71.9) 4. Marked four-chamber cardiomegaly with marked left ventricular hypertrophy. 5. Dilated main pulmonary trunk measuring 4.1 cm in diameter, suggesting pulmonary arterial hypertension. 6. Mildly enlarged mediastinal and right hilar lymph nodes, likely  reactive.  EKG:   07/22/2020: EKG is not ordered today.  Recent Labs: 05/22/2020: B Natriuretic Peptide 3,522.0 05/23/2020: ALT 55; Hemoglobin 13.3; Platelets 298; TSH 0.400 05/31/2020: BUN 24; Creatinine, Ser 1.38; Potassium 4.6; Sodium 140  Recent Lipid Panel    Component Value Date/Time   CHOL 137 05/23/2020 0239   TRIG 80 05/23/2020 0239   HDL 25 (L) 05/23/2020 0239   CHOLHDL 5.5 05/23/2020 0239   VLDL 16 05/23/2020 0239   LDLCALC 96 05/23/2020 0239     Risk  Assessment/Calculations:       Physical Exam:    VS:  BP (!) 142/94   Pulse 93   Ht 5' 9.5" (1.765 m)   Wt 224 lb 3.2 oz (101.7 kg)   SpO2 95%   BMI 32.63 kg/m     Wt Readings from Last 3 Encounters:  07/22/20 224 lb 3.2 oz (101.7 kg)  07/13/20 225 lb 9.6 oz (102.3 kg)  06/21/20 225 lb 6.4 oz (102.2 kg)     GEN: Well nourished, well developed in no acute distress HEENT: Normal NECK: No JVD; No carotid bruits LYMPHATICS: No lymphadenopathy CARDIAC: RRR, no murmurs, rubs, gallops RESPIRATORY:  Clear to auscultation without rales, wheezing or rhonchi  ABDOMEN: Soft, non-tender, non-distended MUSCULOSKELETAL:  No edema; No deformity  SKIN: Warm and dry NEUROLOGIC:  Alert and oriented x 3 PSYCHIATRIC:  Normal affect   ASSESSMENT:    1. HFrEF (heart failure with reduced ejection fraction) ([place])   2. Severe uncontrolled hypertension   3. Medication management   4. Ascending aortic aneurysm ([place])   5. Severe left ventricular hypertrophy   6. Family history of premature coronary artery disease    PLAN:    In order of problems listed above:  #Newly Diagnosed Systolic Heart Failure: TTE with LVEF 25%, severe LVH, G2DD, mild RV dysfunction, severe LAE, moderate RAE, mild-to-moderate MR, trivial AI, severe aortic dilation, small pericaridal effusion. Suspect his heart failure and significant LVH is secondary to very severe, poorly controlled HTN. Denies any anginal symptoms. Currently appears compensated on examination with NYHA class I-II symptoms. Unfortunately, he left AMA from [facility] prior to medical optimization. Patient is much more adherent to blood pressure medications at this time and understands that compliance is very important going forward to prevent worsening of HF. -Will check stress MRI to assess both for ischemia and evidence of amyloidosis given degree of LVH -Change losartan to entresto 24/26mg  BID -Change metoprolol to coreg 6.25mg  BID for better blood pressure  control -Will plan to start spironolactone and farxiga pending repeat BMET next week -Will follow-up in HTN clinic for medication titration -Will follow-up cardiac MRI and plan on cath if evidence of ischemia or infarction -Low Na diet -Emphasized compliance at length today  #HTN with severe LVH: -Continue amlodipine 10mg  daily -Change losartan to entresto 24/26mg  BID -Change metoprolol to coreg 6.25mg  BID for better blood pressure control -Follow-up in HTN clinic next week  #Severe ascending aortic dilation: Measures 4.7cm on CTA 05/22/20 -Needs aggressive blood pressure control as detailed above -Will need CTA vs MRA in June/July of 2022; will order at next visit -Plan to refer to CT surgery for monitoring at next visit in 1-2 months  Follow-up in 2 weeks.  Medication Adjustments/Labs and Tests Ordered: Current medicines are reviewed at length with the patient today.  Concerns regarding medicines are outlined above.  Orders Placed This Encounter  Procedures  . MR CARDIAC STRESS TEST  . Basic metabolic panel  . CBC  .  Basic Metabolic Panel (BMET)  . AMB Referral to [facility]   Meds ordered this encounter  Medications  . carvedilol (COREG) 6.25 MG tablet    Sig: Take 1 tablet (6.25 mg total) by mouth 2 (two) times daily.    Dispense:  180 tablet    Refill:  3  . sacubitril-valsartan (ENTRESTO) 24-26 MG    Sig: Take 1 tablet by mouth 2 (two) times daily.    Dispense:  180 tablet    Refill:  3    Please Honor Card patient is presenting for Carmie Kanner: [id number]; Juanna Cao: [phone]; DVVOH: [facility]; YWVP: [phone]    Patient Instructions   Medication Instructions:  STOP Losartan STOP Metoprolol START Entresto 24/26mg  twice a day by mouth START Coreg 6.25mg  twice a day by mouth *If you need a refill on your cardiac medications before your next appointment, please call your pharmacy*   Lab Work: CBC and BMET to be done at time of Cardiac stress test BMET in 2  weeks for medication management of Entresto. If you have labs (blood work) drawn today and your tests are completely normal, you will receive your results only by: Marland Kitchen MyChart Message (if you have MyChart) OR . A paper copy in the mail If you have any lab test that is abnormal or we need to change your treatment, we will call you to review the results.   Testing/Procedures: Your provider recommends you have a cardiac MRI stress test.    Follow-Up: At [facility], you and your health needs are our priority.  As part of our continuing mission to provide you with exceptional heart care, we have created designated Provider Care Teams.  These Care Teams include your primary Cardiologist (physician) and Advanced Practice Providers (APPs -  Physician Assistants and Nurse Practitioners) who all work together to provide you with the care you need, when you need it.  We recommend signing up for the patient portal called "MyChart".  Sign up information is provided on this After Visit Summary.  MyChart is used to connect with patients for Virtual Visits (Telemedicine).  Patients are able to view lab/test results, encounter notes, upcoming appointments, etc.  Non-urgent messages can be sent to your provider as well.   To learn more about what you can do with MyChart, go to [email or web address].    Your next appointment:   2-3 week(s)  The format for your next appointment:   In Person  Provider:      Other Instructions Referral has been made to Hypertension clinic in this office. Mediterranean Diet A Mediterranean diet refers to food and lifestyle choices that are based on the traditions of countries located on the [employer]. This way of eating has been shown to help prevent certain conditions and improve outcomes for people who have chronic diseases, like kidney disease and heart disease. What are tips for following this plan? Lifestyle  Cook and eat meals together with your family,  when possible.  Drink enough fluid to keep your urine clear or pale yellow.  Be physically active every day. This includes: ? Aerobic exercise like running or swimming. ? Leisure activities like gardening, walking, or housework.  Get 7-8 hours of sleep each night.  If recommended by your health care provider, drink red wine in moderation. This means 1 glass a day for nonpregnant women and 2 glasses a day for men. A glass of wine equals 5 oz (150 mL). Reading food labels  Check the serving size of packaged  foods. For foods such as rice and pasta, the serving size refers to the amount of cooked product, not dry.  Check the total fat in packaged foods. Avoid foods that have saturated fat or trans fats.  Check the ingredients list for added sugars, such as corn syrup.   Shopping  At the grocery store, buy most of your food from the areas near the walls of the store. This includes: ? Fresh fruits and vegetables (produce). ? Grains, beans, nuts, and seeds. Some of these may be available in unpackaged forms or large amounts (in bulk). ? Fresh seafood. ? Poultry and eggs. ? Low-fat dairy products.  Buy whole ingredients instead of prepackaged foods.  Buy fresh fruits and vegetables in-season from local farmers markets.  Buy frozen fruits and vegetables in resealable bags.  If you do not have access to quality fresh seafood, buy precooked frozen shrimp or canned fish, such as tuna, salmon, or sardines.  Buy small amounts of raw or cooked vegetables, salads, or olives from the deli or salad bar at your store.  Stock your pantry so you always have certain foods on hand, such as olive oil, canned tuna, canned tomatoes, rice, pasta, and beans. Cooking  Cook foods with extra-virgin olive oil instead of using butter or other vegetable oils.  Have meat as a side dish, and have vegetables or grains as your main dish. This means having meat in small portions or adding small amounts of meat to  foods like pasta or stew.  Use beans or vegetables instead of meat in common dishes like chili or lasagna.  Experiment with different cooking methods. Try roasting or broiling vegetables instead of steaming or sauteing them.  Add frozen vegetables to soups, stews, pasta, or rice.  Add nuts or seeds for added healthy fat at each meal. You can add these to yogurt, salads, or vegetable dishes.  Marinate fish or vegetables using olive oil, lemon juice, garlic, and fresh herbs. Meal planning  Plan to eat 1 vegetarian meal one day each week. Try to work up to 2 vegetarian meals, if possible.  Eat seafood 2 or more times a week.  Have healthy snacks readily available, such as: ? Vegetable sticks with hummus. ? [place] yogurt. ? Fruit and nut trail mix.  Eat balanced meals throughout the week. This includes: ? Fruit: 2-3 servings a day ? Vegetables: 4-5 servings a day ? Low-fat dairy: 2 servings a day ? Fish, poultry, or lean meat: 1 serving a day ? Beans and legumes: 2 or more servings a week ? Nuts and seeds: 1-2 servings a day ? Whole grains: 6-8 servings a day ? Extra-virgin olive oil: 3-4 servings a day  Limit red meat and sweets to only a few servings a month   What are my food choices?  Mediterranean diet ? Recommended  Grains: Whole-grain pasta. Brown rice. Bulgar wheat. Polenta. Couscous. Whole-wheat bread. [place].  Vegetables: Artichokes. Beets. Broccoli. Cabbage. Carrots. Eggplant. Green beans. Chard. Kale. Spinach. Onions. Leeks. Peas. Squash. Tomatoes. Peppers. Radishes.  Fruits: Apples. Apricots. Avocado. Berries. Bananas. Cherries. Dates. Figs. Grapes. Lemons. Melon. Oranges. Peaches. Plums. Pomegranate.  Meats and other protein foods: Beans. Almonds. Sunflower seeds. Pine nuts. Peanuts. [place]. Salmon. Scallops. Shrimp. [place]. Tilapia. Clams. Oysters. Eggs.  Dairy: Low-fat milk. Cheese. Greek yogurt.  Beverages: Water. Red wine. Herbal tea.  Fats and  oils: Extra virgin olive oil. Avocado oil. Grape seed oil.  Sweets and desserts: [place] yogurt with honey. Baked apples. Poached pears.  Trail mix.  Seasoning and other foods: Basil. Cilantro. Coriander. Cumin. Mint. Parsley. Sage. Rosemary. Tarragon. Garlic. Oregano. Thyme. Pepper. Balsalmic vinegar. Tahini. Hummus. Tomato sauce. Olives. Mushrooms. ? Limit these  Grains: Prepackaged pasta or rice dishes. Prepackaged cereal with added sugar.  Vegetables: Deep fried potatoes (french fries).  Fruits: Fruit canned in syrup.  Meats and other protein foods: Beef. Pork. Lamb. Poultry with skin. Hot dogs. Berniece Salines.  Dairy: Ice cream. Sour cream. Whole milk.  Beverages: Juice. Sugar-sweetened soft drinks. Beer. Liquor and spirits.  Fats and oils: Butter. Canola oil. Vegetable oil. Beef fat (tallow). Lard.  Sweets and desserts: Cookies. Cakes. Pies. Candy.  Seasoning and other foods: Mayonnaise. Premade sauces and marinades. The items listed may not be a complete list. Talk with your dietitian about what dietary choices are right for you. Summary  The Mediterranean diet includes both food and lifestyle choices.  Eat a variety of fresh fruits and vegetables, beans, nuts, seeds, and whole grains.  Limit the amount of red meat and sweets that you eat.  Talk with your health care provider about whether it is safe for you to drink red wine in moderation. This means 1 glass a day for nonpregnant women and 2 glasses a day for men. A glass of wine equals 5 oz (150 mL). This information is not intended to replace advice given to you by your health care provider. Make sure you discuss any questions you have with your health care provider. Document Revised: 10/13/2015 Document Reviewed: 10/06/2015 Elsevier Patient Education  [place].      [employer],acting as a Education administrator for Freada Bergeron, MD.,have documented all relevant documentation on the behalf of Freada Bergeron, MD,as  directed by  Freada Bergeron, MD while in the presence of Freada Bergeron, MD.  I, Freada Bergeron, MD, have reviewed all documentation for this visit. The documentation on 07/22/20 for the exam, diagnosis, procedures, and orders are all accurate and complete.  Signed, Freada Bergeron, MD  07/22/2020 6:08 PM    [facility]

## 2020-07-27 ENCOUNTER — Telehealth: Payer: Self-pay | Admitting: Cardiology

## 2020-08-02 ENCOUNTER — Other Ambulatory Visit: Payer: Self-pay | Admitting: Family Medicine

## 2020-08-02 DIAGNOSIS — I10 Essential (primary) hypertension: Secondary | ICD-10-CM

## 2020-08-02 NOTE — Telephone Encounter (Signed)
Spoke with patient regarding preferred scheduling weekdays and times for the Cardiac Stress test ordered by Dr. Johney Frame.  Informed patient I will be in touch with his appointment information.  He voiced his understanding.

## 2020-08-05 ENCOUNTER — Other Ambulatory Visit: Payer: Self-pay

## 2020-08-08 NOTE — Progress Notes (Signed)
Patient ID: Thomas Spears                 DOB: 26-Nov-1975                      MRN: [id number]     HPI: Thomas Spears is a 45 y.o. male referred by Dr. Johney Frame to pharmacy clinic for HF medication management. PMH is significant for newly diagnosed systolic heart failure with LVEF 25% with global hypokinesis, HTN, mild-to-moderate MR, severe aortic dilation measuring 16mm. Most recent LVEF 25% on 05/23/20.  At last cardiology visit on 5/27, pt reported many years of non-compliance with blood pressure medications, drinking energy drinks and soda regularly and smoking 2 packs of cigarettes per day. Now, he reported medication adherence to BP medications with SBP 140s and currently down to 6 cigs/day. At this visit, BP was 142/94 and losartan was changed to Entresto 24/26mg  BID and metoprolol succinate changed to carvedilol 6.25mg  BID for better blood pressure control; with plans to start spironolactone and Farxiga pending repeat BMET.  Patient presents today in good spirits, recently diagnosed with systolic heart failure. Patient brought medications and reports medication adherence. Reports medications are working pretty good. Reports a quick thumping sensation that occurs at night - occurred 3 times last week. Reports history of headaches and using tylenol for management. Denies NSAID use. Reports blurry vision every now and then - used to wear glasses, however are broken and never returned back to the eye doctor due to being uninsured. Denies dizziness and LE swelling. Did not bring BP log today and cannot recall home readings, however reports that he does keep a chart of his BP readings at home. Able to complete all ADLs. Reports not checking weight at home. Reports adhering to a low-salt diet and has quit drinking energy drinks.  Current CHF meds: Entresto 24-26 mg BID, carvedilol 6.25 mg BID, amlodipine 10 mg daily, indapamide 1.25 mg daily (pedal edema) Previously tried: losartan, metoprolol  succinate BP goal: <130/80 mmHg  Family History: Has a strong family history of CAD: his father had CABGx2 in his 59's-50's. 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  Past Medical History:  Diagnosis Date   Community acquired pneumonia    Hypertension    Hypertensive crisis 05/22/2020   Tension headache 06/21/2020    Current Outpatient Medications on File Prior to Visit  Medication Sig Dispense Refill   amLODipine (NORVASC) 10 MG tablet Take 1 tablet (10 mg total) by mouth daily. 30 tablet 11   aspirin EC 81 MG tablet Take 81 mg by mouth daily. Swallow whole.     atorvastatin (LIPITOR) 40 MG tablet TAKE 1 TABLET(40 MG) BY MOUTH DAILY 30 tablet 0   carvedilol (COREG) 6.25 MG tablet Take 1 tablet (6.25 mg total)  by mouth 2 (two) times daily. 180 tablet 3   indapamide (LOZOL) 1.25 MG tablet TAKE 1 TABLET(1.25 MG) BY MOUTH DAILY. INCREASE TO 2 TABLETS. IF TOLERATING WELL IN SEVERAL DAYS 30 tablet 0   sacubitril-valsartan (ENTRESTO) 24-26 MG Take 1 tablet by mouth 2 (two) times daily. 180 tablet 3   No current facility-administered medications on file prior to visit.    Allergies  Allergen Reactions   Mushroom Extract Complex Anaphylaxis     Assessment/Plan:  1. CHF - BP near goal <130/80 mmHg. Medication adherence appears optimal. Will increase carvedilol to 12.5  mg twice daily to better optimize HF GDMT. Advised patient to continue to monitor thumping sensation and to call the cardiology office if it becomes persistent or with any concerns. Patient verbalized understanding. Continued Entresto 24-26 mg twice daily, amlodipine 10 mg daily, and indapamide 1.25 mg daily. Additionally, pt completed patient assistance application for Entresto since he is uninsured. Will fax application upon receiving signature of cardiologist. Encouraged patient to continue to check BP daily at least 1 hour after medications and to bring log to the next visit. Encouraged patient to start checking weight daily to assess for fluid accumulation. Patient verbalized understanding. Encouraged patient to aim for a diet full of vegetables, fruit and lean meats (chicken, [place], fish) and to limit salt intake by eating fresh or frozen vegetables (instead of canned), rinse canned vegetables prior to cooking and do not add any additional salt to meals. Encouraged patient to exercise 20-30 minutes daily with the goal of 150 minutes per week. Patient verbalized understanding. Follow-up appointment scheduled in 4 weeks.  Lorel Monaco, PharmD, [facility] [address], [place], [place] [address] Phone: [phone]; Fax: [phone]

## 2020-08-09 ENCOUNTER — Other Ambulatory Visit: Payer: Self-pay | Admitting: *Deleted

## 2020-08-09 ENCOUNTER — Encounter: Payer: Self-pay | Admitting: Pharmacist

## 2020-08-09 ENCOUNTER — Other Ambulatory Visit: Payer: Self-pay

## 2020-08-09 ENCOUNTER — Ambulatory Visit (INDEPENDENT_AMBULATORY_CARE_PROVIDER_SITE_OTHER): Payer: Self-pay | Admitting: Pharmacist

## 2020-08-09 VITALS — BP 122/82 | HR 94 | Wt 224.8 lb

## 2020-08-09 DIAGNOSIS — I5020 Unspecified systolic (congestive) heart failure: Secondary | ICD-10-CM

## 2020-08-09 MED ORDER — CARVEDILOL 12.5 MG PO TABS: 12.5000 mg | ORAL_TABLET | Freq: Two times a day (BID) | ORAL | 3 refills | Status: DC

## 2020-08-09 NOTE — Patient Instructions (Addendum)
It was nice to see you today!  Your goal blood pressure is <130/80 mmHg.  Medication Changes: Increase carvedilol (coreg) to 12.5 mg twice daily  Continue Entresto 24-26 mg twice daily  Continue amlodipine 10 mg daily  Continue indapamide 1.25 mg daily   Monitor blood pressure at home daily and keep a log (on your phone or piece of paper) to bring with you to your next visit. Write down date, time, blood pressure and pulse.  Keep up the good work with diet and exercise. Aim for a diet full of vegetables, fruit and lean meats (chicken, [place], fish). Try to limit salt intake by eating fresh or frozen vegetables (instead of canned), rinse canned vegetables prior to cooking and do not add any additional salt to meals.

## 2020-08-10 LAB — BASIC METABOLIC PANEL
BUN/Creatinine Ratio: 14 (ref 9–20)
BUN: 16 mg/dL (ref 6–24)
CO2: 23 mmol/L (ref 20–29)
Calcium: 9.7 mg/dL (ref 8.7–10.2)
Chloride: 102 mmol/L (ref 96–106)
Creatinine, Ser: 1.17 mg/dL (ref 0.76–1.27)
Glucose: 90 mg/dL (ref 65–99)
Potassium: 3.8 mmol/L (ref 3.5–5.2)
Sodium: 142 mmol/L (ref 134–144)
eGFR: 79 mL/min/{1.73_m2} (ref >59)

## 2020-08-11 ENCOUNTER — Encounter: Payer: Self-pay | Admitting: Cardiology

## 2020-08-11 NOTE — Telephone Encounter (Signed)
Spoke with patient regarding the Friday 08/19/20 2:00pm Cardiac Stress scheduled at [facility].  Arrival time is 1:30 pm 1st floor admissions office for check in.  Will mail information to patient and he voiced his understanding.

## 2020-08-17 ENCOUNTER — Telehealth (HOSPITAL_COMMUNITY): Payer: Self-pay | Admitting: Emergency Medicine

## 2020-08-17 ENCOUNTER — Other Ambulatory Visit (HOSPITAL_COMMUNITY): Payer: Self-pay | Admitting: Emergency Medicine

## 2020-08-17 DIAGNOSIS — I5020 Unspecified systolic (congestive) heart failure: Secondary | ICD-10-CM

## 2020-08-17 NOTE — Telephone Encounter (Signed)
Attempted to call patient regarding upcoming cardiac MR appointment. Left message on voicemail with name and callback number Marchia Bond RN Navigator Cardiac Imaging [facility] [phone] Office [phone] Cell  Left detailed message about EKG appt at 1:15pm

## 2020-08-18 ENCOUNTER — Telehealth (HOSPITAL_COMMUNITY): Payer: Self-pay | Admitting: Emergency Medicine

## 2020-08-18 NOTE — Telephone Encounter (Signed)
Reaching out to patient to offer assistance regarding upcoming cardiac imaging study; pt verbalizes understanding of appt date/time, parking situation and where to check in, and verified current allergies; name and call back number provided for further questions should they arise Marchia Bond RN Navigator Cardiac Imaging [facility] [phone] office [phone] cell  Denies implants other than coronary stent, denies claustro Aware to check in at H&V registration for EKG at 1:15p Clarise Cruz

## 2020-08-19 ENCOUNTER — Other Ambulatory Visit: Payer: Self-pay | Admitting: Family Medicine

## 2020-08-19 ENCOUNTER — Encounter (HOSPITAL_COMMUNITY): Payer: Self-pay

## 2020-08-19 ENCOUNTER — Ambulatory Visit (HOSPITAL_COMMUNITY)
Admission: RE | Admit: 2020-08-19 | Discharge: 2020-08-19 | Disposition: A | Discharge status: Home / Self Care | Payer: Self-pay | Source: Ambulatory Visit | Attending: Cardiology | Admitting: Cardiology

## 2020-08-19 ENCOUNTER — Encounter: Payer: Self-pay | Admitting: Cardiology

## 2020-08-19 ENCOUNTER — Other Ambulatory Visit: Payer: Self-pay

## 2020-08-19 DIAGNOSIS — I5020 Unspecified systolic (congestive) heart failure: Secondary | ICD-10-CM | POA: Insufficient documentation

## 2020-08-19 DIAGNOSIS — I10 Essential (primary) hypertension: Secondary | ICD-10-CM

## 2020-08-19 MED ORDER — REGADENOSON 0.4 MG/5ML IV SOLN
INTRAVENOUS | Status: AC
  Filled 2020-08-19: qty 5

## 2020-08-19 MED ORDER — GADOBUTROL 1 MMOL/ML IV SOLN
10.0000 mL | Freq: Once | INTRAVENOUS | Status: AC | PRN
  Administered 2020-08-19: 10 mL via INTRAVENOUS

## 2020-08-19 NOTE — Progress Notes (Signed)
Pt presents for cardiac stress MR, denies complaints.

## 2020-08-19 NOTE — Progress Notes (Signed)
RN administered lexiscan via IV; pt tolerated without complaint of CP, SOB; pt states he felt like he was running; ok to proceed

## 2020-08-19 NOTE — Progress Notes (Signed)
Thomas Spears presents for stress MRI today.  BP 130/95, P 74.  EKG with NSR, LVH with repolarization.  Lungs CTAB.  Shared Decision Making/Informed Consent The risks [chest pain, shortness of breath, cardiac arrhythmias, dizziness, blood pressure fluctuations, myocardial infarction, stroke/transient ischemic attack, nausea, vomiting, allergic reaction, and life-threatening complications (estimated to be 1 in 10,000)], benefits (risk stratification, diagnosing coronary artery disease, treatment guidance) and alternatives of a MRI stress test were discussed in detail with Thomas. Castrejon and he agrees to proceed.

## 2020-08-23 ENCOUNTER — Ambulatory Visit (INDEPENDENT_AMBULATORY_CARE_PROVIDER_SITE_OTHER): Payer: Self-pay | Admitting: Family

## 2020-08-23 ENCOUNTER — Encounter: Payer: Self-pay | Admitting: Family

## 2020-08-23 ENCOUNTER — Other Ambulatory Visit: Payer: Self-pay

## 2020-08-23 VITALS — BP 124/80 | HR 84 | Ht 69.0 in | Wt 227.0 lb

## 2020-08-23 DIAGNOSIS — I10 Essential (primary) hypertension: Secondary | ICD-10-CM

## 2020-08-23 DIAGNOSIS — I5020 Unspecified systolic (congestive) heart failure: Secondary | ICD-10-CM

## 2020-08-23 DIAGNOSIS — E782 Mixed hyperlipidemia: Secondary | ICD-10-CM

## 2020-08-23 DIAGNOSIS — Z720 Tobacco use: Secondary | ICD-10-CM

## 2020-08-23 DIAGNOSIS — I517 Cardiomegaly: Secondary | ICD-10-CM

## 2020-08-23 DIAGNOSIS — R9389 Abnormal findings on diagnostic imaging of other specified body structures: Secondary | ICD-10-CM

## 2020-08-23 DIAGNOSIS — I998 Other disorder of circulatory system: Secondary | ICD-10-CM

## 2020-08-23 DIAGNOSIS — I712 Thoracic aortic aneurysm, without rupture: Secondary | ICD-10-CM

## 2020-08-23 DIAGNOSIS — I7121 Aneurysm of the ascending aorta, without rupture: Secondary | ICD-10-CM

## 2020-08-23 MED ORDER — INDAPAMIDE 1.25 MG PO TABS: 1.2500 mg | ORAL_TABLET | Freq: Every day | ORAL | 5 refills | Status: DC

## 2020-08-23 MED ORDER — SODIUM CHLORIDE 0.9% FLUSH: 3.0000 mL | Freq: Two times a day (BID) | INTRAVENOUS | Status: DC

## 2020-08-23 MED ORDER — ATORVASTATIN CALCIUM 40 MG PO TABS: 40.0000 mg | ORAL_TABLET | Freq: Every day | ORAL | 5 refills | Status: DC

## 2020-08-23 NOTE — H&P (View-Only) (Signed)
Office Visit    Patient Name: Thomas Spears Date of Encounter: 08/23/2020  PCP:  Lattie Haw, MD   [place]  Cardiologist:  Freada Bergeron, MD  Advanced Practice Provider:  No care team member to display Electrophysiologist:  None   Chief Complaint    Thomas Spears is a 45 y.o. male with a hx of CAD s/p stent 10 years ago with unknown details, systolic heart failure with LVEF 25% with global hypokinesis, hypertension, mild to moderate MR, severe aortic dilation measuring 50 mm of LVH presents today for follow-up after cardiac MRI  Past Medical History    Past Medical History:  Diagnosis Date   Aortic regurgitation    07/2020 cardiac MRI moderate aortic regurgitation   Ascending aorta dilation ([place])    05/01/20 CTA 4.7 cm. 08/19/20 cardiac stress MRI 40mm.   Chronic combined systolic and diastolic CHF (congestive heart failure) ([place])    Community acquired pneumonia    HLD (hyperlipidemia)    Hypertension    Hypertensive crisis 05/22/2020   Left ventricular hypertrophy    Mitral regurgitation    07/2020 cardiac MRI mild ot moderate MR   Tension headache 06/21/2020   Tobacco use    Past Surgical History:  Procedure Laterality Date   CAROTID STENT INSERTION      Allergies  Allergies  Allergen Reactions   Mushroom Extract Complex Anaphylaxis    History of Present Illness    Thomas Spears is a 45 y.o. male with a hx of CAD s/p stent approx 10 year ago with unknown details, systolic heart failure with LVEF 25% with global hypokinesis, hypertension, mild to moderate MR, severe aortic dilation measuring 50 mm of LVH last seen by pharmacist 08/09/20.  He was hospitalized at [facility] 04/2020 with hypertensive crisis.  CTA chest with no PE but showed thoracic aortic aneurysm 4.7 cm no evidence of pulmonary hypertension.  TTE 05/23/2020 LVEF 25% with global hypokinesis, severe LVH, grade 2 diastolic dysfunction, mild RV dysfunction, severe  LAE, moderate RAE, mild to moderate MR, trivial AI, severe aortic dilation, small pericardial effusion.  He left AMA prior to completing work-up.  He was seen in clinic 07/23/2018 by Dr. Johney Frame.  He reported several years of not taking his medication and uncontrolled hypertension.  Also regularly drinking energy drinks, soda, smoking 2 packs/day.  He used to be a heavy drinker.  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  He was taking all of his blood pressure medications and feeling overall well.  He displayed NYHA I-II symptoms.  He was recommended for stress MRI to assess for ischemia and evidence of amyloidosis.  His losartan was transitioned to [facility], metoprolol transition to carvedilol.  He was referred to the hypertension clinic.  Episode with pharmacy team 08/09/20 patient assistance application was follow-up for [facility] as he was uninsured.  Carvedilol was increased to 12.5 mg twice daily.  He did note a "thump" sensation.  He had cardiac stress MRI 08/19/20 showing basal to mid inferior stress perfusion defect consistent with ischemia.  He had moderate LV dilation with moderate systolic dysfunction LVEF 34%.  Asymmetric LV hypertrophy measuring up to 39 mL basal septum differential including hypertrophic cardiomyopathy versus hypertensive heart disease.  No evidence of cardiac amyloidosis.  Normal RV size with mild systolic dysfunction, moderate aortic regurgitation, mild to moderate mitral regurgitation, ascending aortic aneurysm measuring 45 mm.  Given evidence of ischemia, he was recommended for cardiac catheterization and follow-up appointment  was arranged.  He presents today for follow up. Reports no shortness of breath at rest. Tells me his dyspnea on exertion is stable and occurs with more than usual activity. Reports no chest pain, pressure, or tightness. No orthopnea, PND. Reports no palpitations. He has been out of Indapamide for 5 days and notes some mild edema to his  bilateral ankles. Refill was sent today. Does note history of CAD with stent more than 10 years ago to unknown vessel at [facility] with [facility]. He lives at home with his wife and 2 month old son. He also has a 25 year old daughter. Checking blood pressure at home with reading 120s-140. Notes he has 4 tablets of Entresto left, per pharmacist discussion with [employer] determination should be made in 2-3 days.   EKGs/Labs/Other Studies Reviewed:   The following studies were reviewed today:  Cardiac MRI Stress 08/19/20 IMPRESSION: 1. Basal to mid inferior stress perfusion defect consistent with ischemia   2. Moderate LV dilatation with moderate systolic dysfunction (EF 61%)   3. Asymmetric LV hypertrophy measuring up to 99mm in basal septum (63mm in posterior wall). Differential includes hypertrophic cardiomyopathy vs hypertensive heart disease given history of severe uncontrolled hypertension. No evidence of cardiac amyloidosis. Patchy midwall LGE, which can be seen in either HCM or hypertensive heart disease. Suspect component of hypertensive heart disease, but also with likely HCM given extent of hypertrophy and significant amount of LGE, accounting for 12% of total myocardial mass   4.  Normal RV size with mild systolic dysfunction (EF 22%)   5.  Moderate aortic regurgitation (regurgitant fraction 27%)   6.  Mild to moderate mitral regurgitation (regurgitant fraction 21%)   7.  Ascending aortic aneurysm measuring 18mm  TTE 05/23/20: IMPRESSIONS   1. Left ventricular ejection fraction, by estimation, is 25%. The left  ventricle has severely decreased function. The left ventricle demonstrates  global hypokinesis. The left ventricular internal cavity size was mildly  dilated. There is severe left  ventricular hypertrophy raising concern cardiac amyloidosis versus  long-standing severe hypertension. Left ventricular diastolic parameters  are consistent with  Grade II diastolic dysfunction (pseudonormalization).   2. Right ventricular systolic function is mildly reduced. The right  ventricular size is normal.   3. Left atrial size was severely dilated.   4. Right atrial size was moderately dilated.   5. The mitral valve is normal in structure. Mild to moderate mitral valve  regurgitation. No evidence of mitral stenosis.   6. The aortic valve is tricuspid. Aortic valve regurgitation is trivial.  No aortic stenosis is present.   7. Aortic dilatation noted. There is severe dilatation of the ascending  aorta, measuring 50 mm.   8. The inferior vena cava is normal in size with <50% respiratory  variability, suggesting right atrial pressure of 8 mmHg.   9. A small pericardial effusion is present.   CTA 05/22/20: IMPRESSION: 1. No evidence of acute pulmonary embolism. 2. Multifocal patchy airspace opacity within the right lung most pronounced within the right upper lobe. Findings are concerning for multifocal pneumonia. Follow-up to resolution is recommended. 3. 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 Recommend semi-annual imaging followup by CTA or MRA and referral to cardiothoracic surgery if not already obtained. This recommendation follows 2010 ACCF/AHA/AATS/ACR/ASA/SCA/SCAI/SIR/STS/SVM Guidelines for the Diagnosis and Management of Patients With Thoracic Aortic Disease. Circulation. 2010; 121: E497-N300. Aortic aneurysm NOS (ICD10-I71.9) 4. Marked four-chamber cardiomegaly with marked left ventricular hypertrophy. 5. Dilated  main pulmonary trunk measuring 4.1 cm in diameter, suggesting pulmonary arterial hypertension. 6. Mildly enlarged mediastinal and right hilar lymph nodes, likely reactive.  EKG:  No EKG today.   Recent Labs: 05/22/2020: B Natriuretic Peptide 3,522.0 05/23/2020: ALT 55; Hemoglobin 13.3; Platelets 298; TSH 0.400 08/09/2020: BUN 16; Creatinine, Ser 1.17; Potassium 3.8; Sodium 142  Recent  Lipid Panel    Component Value Date/Time   CHOL 137 05/23/2020 0239   TRIG 80 05/23/2020 0239   HDL 25 (L) 05/23/2020 0239   CHOLHDL 5.5 05/23/2020 0239   VLDL 16 05/23/2020 0239   LDLCALC 96 05/23/2020 0239   Home Medications   Current Meds  Medication Sig   amLODipine (NORVASC) 10 MG tablet Take 1 tablet (10 mg total) by mouth daily.   aspirin EC 81 MG tablet Take 81 mg by mouth daily. Swallow whole.   carvedilol (COREG) 12.5 MG tablet Take 1 tablet (12.5 mg total) by mouth 2 (two) times daily.   sacubitril-valsartan (ENTRESTO) 24-26 MG Take 1 tablet by mouth 2 (two) times daily.   [DISCONTINUED] atorvastatin (LIPITOR) 40 MG tablet TAKE 1 TABLET(40 MG) BY MOUTH DAILY   [DISCONTINUED] indapamide (LOZOL) 1.25 MG tablet TAKE 1 TABLET(1.25 MG) BY MOUTH DAILY. INCREASE TO 2 TABLETS. IF TOLERATING WELL IN SEVERAL DAYS   Current Facility-Administered Medications for the 08/23/20 encounter (Office Visit) with Loel Dubonnet, NP  Medication   sodium chloride flush (NS) 0.9 % injection 3 mL     Review of Systems    All other systems reviewed and are otherwise negative except as noted above.  Physical Exam    VS:  BP 124/80   Pulse 84   Ht 5\' 9"  (1.753 m)   Wt 227 lb (103 kg)   SpO2 97%   BMI 33.52 kg/m  , BMI Body mass index is 33.52 kg/m.  Wt Readings from Last 3 Encounters:  08/23/20 227 lb (103 kg)  08/09/20 224 lb 12.8 oz (102 kg)  07/22/20 224 lb 3.2 oz (101.7 kg)    GEN: Well nourished, well developed, in no acute distress. HEENT: normal. Neck: Supple, no JVD, carotid bruits, or masses. Cardiac: RRR, no murmurs, rubs, or gallops. No clubbing, cyanosis. Trace pedal edema bilaterally.  Radials/PT 2+ and equal bilaterally.  Respiratory:  Respirations regular and unlabored, clear to auscultation bilaterally. GI: Soft, nontender, nondistended. MS: No deformity or atrophy. Skin: Warm and dry, no rash. Neuro:  Strength and sensation are intact. Psych: Normal  affect.  Assessment & Plan    HFrEF - Cardiac stress MRI 08/19/20 with basal to mid inferior stress perfusion defect consistent with ischemia, EF 34%. Plan for left heart catheterization. BMP and CBC for pre-procedure labs ordered. Patient self reports history of coronary stent 10 years ago at [facility], details unknown and unable to locate in [place]. GDMT includes Coreg 12.5mg  BID, Entresto 24-26mg  BID, Indapamide 1.25mg  QD. Future considerations include addition of [place]ade - as he is without insurance will need to fill out patient assistance application. Entresto patient assistance application completed previously by pharmacist - per [employer] will be reviewed in next 3-5 days. He has 4 tablets and additional 1 week supply of samples provided. Heart healthy diet and regular cardiovascular exercise encouraged. Consider referral to cardiac rehab at follow up.   Shared Decision Making/Informed Consent{ The risks [stroke (1 in 1000), death (1 in 1000), kidney failure [usually temporary] (1 in 500), bleeding (1 in 200), allergic reaction [possibly serious] (1 in 200)], benefits (diagnostic  support and management of coronary artery disease) and alternatives of a cardiac catheterization were discussed in detail with Mr. Rothbauer and he is willing to proceed.  Hypertension with severe LVH - Cardiac stress MRI 08/19/20 with asymmetric LVH up to 28mm inbasal septum differential hypertrophic cardiomyopathy vs hyperteisve heart disease. Likely HCM comonent given extent of hypertrophy and significant amount of LGE. BP well controlled. Continue current antihypertensive regimen.    Severe ascending aortic dilation- 4.7 cm by CTA 05/22/2020.  Ascending aorta 45 mm by cardiac stress MRI. Plan to refer to cardiothoracic surgery at follow up. Continue optimal BP control, aspirin, statin.   CAD - Patient reported history of stent x1 10 years ago. Details unknown. GDMT includes Aspirin 81mg  daily, Coreg 12.5mg   BID, Atorvastatin 40mg  QD.   HLD - Continue Atorvasatin 40mg  daily. 05/23/20 LDL 96. LDL goal <70. Notes interruption in Atorvasatin for last 5 days, refill provided, defer lipid recheck at this time.   Valvular heart disease - Moderate AI, mild to moderate MR by cardiac stress MRI 08/19/20. Continue optimal BP and volume control. Periodic echocardiogram for monitoring.   Tobacco use - Smoking cessation encouraged. Recommend utilization of [id number].  Disposition: Follow up in 3 week(s) with Dr. Johney Frame or APP  Signed, Loel Dubonnet, NP 08/23/2020, 7:16 PM [place]

## 2020-08-23 NOTE — Patient Instructions (Addendum)
Medication Instructions:  Continue your current medications.   *If you need a refill on your cardiac medications before your next appointment, please call your pharmacy*   Lab Work: Your physician recommends lab work today: BMP, CBC  If you have labs (blood work) drawn today and your tests are completely normal, you will receive your results only by: MyChart Message (if you have MyChart) OR A paper copy in the mail If you have any lab test that is abnormal or we need to change your treatment, we will call you to review the results.   Testing/Procedures: Your physician has requested that you have a cardiac catheterization. Cardiac catheterization is used to diagnose and/or treat various heart conditions. Doctors may recommend this procedure for a number of different reasons. The most common reason is to evaluate chest pain. Chest pain can be a symptom of coronary artery disease (CAD), and cardiac catheterization can show whether plaque is narrowing or blocking your heart's arteries. Please follow instruction sheet, BELOW:     [facility] [place], [place] [address] Dept: [phone] Loc: [phone]  Randie Tallarico  08/23/2020  You are scheduled for a Cardiac Catheterization on Wednesday, July 6 with Dr. [name].  1. Please arrive at the [facility] (Main Entrance A) at [facility]: [address] [place], [place] [address] at 6:30 AM (This time is two hours before your procedure to ensure your preparation). Free valet parking service is available.   Special note: Every effort is made to have your procedure done on time. Please understand that emergencies sometimes delay scheduled procedures.  2. Diet: Do not eat solid foods after midnight.  The patient may have clear liquids until 5am upon the day of the procedure.  3. Labs: You will need to have blood drawn  on Friday, July 1 at [facility] at [facility]. [address]  Open: 7:30am - 4:30 pm    Phone: [phone]. You do not need to be fasting.  4. Medication instructions in preparation for your procedure:   Contrast Allergy: No  On the morning of your procedure, take your Aspirin and any morning medicines NOT listed above.  You may use sips of water.  5. Plan for one night stay--bring personal belongings. 6. Bring a current list of your medications and current insurance cards. 7. You MUST have a responsible person to drive you home. 8. Someone MUST be with you the first 24 hours after you arrive home or your discharge will be delayed. 9. Please wear clothes that are easy to get on and off and wear slip-on shoes.  Thank you for allowing [place] to care for you!   -- [facility] .    Follow-Up: At [facility], you and your health needs are our priority.  As part of our continuing mission to provide you with exceptional heart care, we have created designated Provider Care Teams.  These Care Teams include your primary Cardiologist (physician) and Advanced Practice Providers (APPs -  Physician Assistants and Nurse Practitioners) who all work together to provide you with the care you need, when you need it.  We recommend signing up for the patient portal called "MyChart".  Sign up information is provided on this After Visit Summary.  MyChart is used to connect with patients for Virtual Visits (Telemedicine).  Patients are able to view lab/test results, encounter notes,  upcoming appointments, etc.  Non-urgent messages can be sent to your provider as well.   To learn more about what you can do with MyChart, go to [email or web address].    Your next appointment:   3 week(s)   09/06/2020 ARRIVE AT 8:15  AT OUR NEW LOCATION,  [facility] [address], [place], [place], [place]  [address].    The format for your next  appointment:   In Person  Provider:   You may see Freada Bergeron, MD or one of the following Advanced Practice Providers on your designated Care Team:   Richardson Dopp, PA-C [place], [place]   Other Instructions  Heart Healthy Diet Recommendations: A low-salt diet is recommended. Meats should be grilled, baked, or boiled. Avoid fried foods. Focus on lean protein sources like fish or chicken with vegetables and fruits. The American Heart Association is a Microbiologist!  American Heart Association Diet and Lifeystyle Recommendations   Exercise recommendations: The American Heart Association recommends 150 minutes of moderate intensity exercise weekly. Try 30 minutes of moderate intensity exercise 4-5 times per week. This could include walking, jogging, or swimming.

## 2020-08-23 NOTE — Progress Notes (Signed)
Office Visit    Patient Name: Thomas Spears Date of Encounter: 08/23/2020  PCP:  Lattie Haw, MD   [place]  Cardiologist:  Freada Bergeron, MD  Advanced Practice Provider:  No care team member to display Electrophysiologist:  None   Chief Complaint    Thomas Spears is a 45 y.o. male with a hx of CAD s/p stent 10 years ago with unknown details, systolic heart failure with LVEF 25% with global hypokinesis, hypertension, mild to moderate MR, severe aortic dilation measuring 50 mm of LVH presents today for follow-up after cardiac MRI  Past Medical History    Past Medical History:  Diagnosis Date   Aortic regurgitation    07/2020 cardiac MRI moderate aortic regurgitation   Ascending aorta dilation ([place])    05/01/20 CTA 4.7 cm. 08/19/20 cardiac stress MRI 33mm.   Chronic combined systolic and diastolic CHF (congestive heart failure) ([place])    Community acquired pneumonia    HLD (hyperlipidemia)    Hypertension    Hypertensive crisis 05/22/2020   Left ventricular hypertrophy    Mitral regurgitation    07/2020 cardiac MRI mild ot moderate MR   Tension headache 06/21/2020   Tobacco use    Past Surgical History:  Procedure Laterality Date   CAROTID STENT INSERTION      Allergies  Allergies  Allergen Reactions   Mushroom Extract Complex Anaphylaxis    History of Present Illness    Thomas Spears is a 45 y.o. male with a hx of CAD s/p stent approx 10 year ago with unknown details, systolic heart failure with LVEF 25% with global hypokinesis, hypertension, mild to moderate MR, severe aortic dilation measuring 50 mm of LVH last seen by pharmacist 08/09/20.  He was hospitalized at [facility] 04/2020 with hypertensive crisis.  CTA chest with no PE but showed thoracic aortic aneurysm 4.7 cm no evidence of pulmonary hypertension.  TTE 05/23/2020 LVEF 25% with global hypokinesis, severe LVH, grade 2 diastolic dysfunction, mild RV dysfunction, severe  LAE, moderate RAE, mild to moderate MR, trivial AI, severe aortic dilation, small pericardial effusion.  He left AMA prior to completing work-up.  He was seen in clinic 07/23/2018 by Dr. Johney Frame.  He reported several years of not taking his medication and uncontrolled hypertension.  Also regularly drinking energy drinks, soda, smoking 2 packs/day.  He used to be a heavy drinker.  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  He was taking all of his blood pressure medications and feeling overall well.  He displayed NYHA I-II symptoms.  He was recommended for stress MRI to assess for ischemia and evidence of amyloidosis.  His losartan was transitioned to [facility], metoprolol transition to carvedilol.  He was referred to the hypertension clinic.  Episode with pharmacy team 08/09/20 patient assistance application was follow-up for [facility] as he was uninsured.  Carvedilol was increased to 12.5 mg twice daily.  He did note a "thump" sensation.  He had cardiac stress MRI 08/19/20 showing basal to mid inferior stress perfusion defect consistent with ischemia.  He had moderate LV dilation with moderate systolic dysfunction LVEF 34%.  Asymmetric LV hypertrophy measuring up to 39 mL basal septum differential including hypertrophic cardiomyopathy versus hypertensive heart disease.  No evidence of cardiac amyloidosis.  Normal RV size with mild systolic dysfunction, moderate aortic regurgitation, mild to moderate mitral regurgitation, ascending aortic aneurysm measuring 45 mm.  Given evidence of ischemia, he was recommended for cardiac catheterization and follow-up appointment  was arranged.  He presents today for follow up. Reports no shortness of breath at rest. Tells me his dyspnea on exertion is stable and occurs with more than usual activity. Reports no chest pain, pressure, or tightness. No orthopnea, PND. Reports no palpitations. He has been out of Indapamide for 5 days and notes some mild edema to his  bilateral ankles. Refill was sent today. Does note history of CAD with stent more than 10 years ago to unknown vessel at [facility] with [facility]. He lives at home with his wife and 48 month old son. He also has a 18 year old daughter. Checking blood pressure at home with reading 120s-140. Notes he has 4 tablets of Entresto left, per pharmacist discussion with [employer] determination should be made in 2-3 days.   EKGs/Labs/Other Studies Reviewed:   The following studies were reviewed today:  Cardiac MRI Stress 08/19/20 IMPRESSION: 1. Basal to mid inferior stress perfusion defect consistent with ischemia   2. Moderate LV dilatation with moderate systolic dysfunction (EF 48%)   3. Asymmetric LV hypertrophy measuring up to 51mm in basal septum (62mm in posterior wall). Differential includes hypertrophic cardiomyopathy vs hypertensive heart disease given history of severe uncontrolled hypertension. No evidence of cardiac amyloidosis. Patchy midwall LGE, which can be seen in either HCM or hypertensive heart disease. Suspect component of hypertensive heart disease, but also with likely HCM given extent of hypertrophy and significant amount of LGE, accounting for 12% of total myocardial mass   4.  Normal RV size with mild systolic dysfunction (EF 18%)   5.  Moderate aortic regurgitation (regurgitant fraction 27%)   6.  Mild to moderate mitral regurgitation (regurgitant fraction 21%)   7.  Ascending aortic aneurysm measuring 35mm  TTE 05/23/20: IMPRESSIONS   1. Left ventricular ejection fraction, by estimation, is 25%. The left  ventricle has severely decreased function. The left ventricle demonstrates  global hypokinesis. The left ventricular internal cavity size was mildly  dilated. There is severe left  ventricular hypertrophy raising concern cardiac amyloidosis versus  long-standing severe hypertension. Left ventricular diastolic parameters  are consistent with  Grade II diastolic dysfunction (pseudonormalization).   2. Right ventricular systolic function is mildly reduced. The right  ventricular size is normal.   3. Left atrial size was severely dilated.   4. Right atrial size was moderately dilated.   5. The mitral valve is normal in structure. Mild to moderate mitral valve  regurgitation. No evidence of mitral stenosis.   6. The aortic valve is tricuspid. Aortic valve regurgitation is trivial.  No aortic stenosis is present.   7. Aortic dilatation noted. There is severe dilatation of the ascending  aorta, measuring 50 mm.   8. The inferior vena cava is normal in size with <50% respiratory  variability, suggesting right atrial pressure of 8 mmHg.   9. A small pericardial effusion is present.   CTA 05/22/20: IMPRESSION: 1. No evidence of acute pulmonary embolism. 2. Multifocal patchy airspace opacity within the right lung most pronounced within the right upper lobe. Findings are concerning for multifocal pneumonia. Follow-up to resolution is recommended. 3. 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 Recommend semi-annual imaging followup by CTA or MRA and referral to cardiothoracic surgery if not already obtained. This recommendation follows 2010 ACCF/AHA/AATS/ACR/ASA/SCA/SCAI/SIR/STS/SVM Guidelines for the Diagnosis and Management of Patients With Thoracic Aortic Disease. Circulation. 2010; 121: H631-S970. Aortic aneurysm NOS (ICD10-I71.9) 4. Marked four-chamber cardiomegaly with marked left ventricular hypertrophy. 5. Dilated  main pulmonary trunk measuring 4.1 cm in diameter, suggesting pulmonary arterial hypertension. 6. Mildly enlarged mediastinal and right hilar lymph nodes, likely reactive.  EKG:  No EKG today.   Recent Labs: 05/22/2020: B Natriuretic Peptide 3,522.0 05/23/2020: ALT 55; Hemoglobin 13.3; Platelets 298; TSH 0.400 08/09/2020: BUN 16; Creatinine, Ser 1.17; Potassium 3.8; Sodium 142  Recent  Lipid Panel    Component Value Date/Time   CHOL 137 05/23/2020 0239   TRIG 80 05/23/2020 0239   HDL 25 (L) 05/23/2020 0239   CHOLHDL 5.5 05/23/2020 0239   VLDL 16 05/23/2020 0239   LDLCALC 96 05/23/2020 0239   Home Medications   Current Meds  Medication Sig   amLODipine (NORVASC) 10 MG tablet Take 1 tablet (10 mg total) by mouth daily.   aspirin EC 81 MG tablet Take 81 mg by mouth daily. Swallow whole.   carvedilol (COREG) 12.5 MG tablet Take 1 tablet (12.5 mg total) by mouth 2 (two) times daily.   sacubitril-valsartan (ENTRESTO) 24-26 MG Take 1 tablet by mouth 2 (two) times daily.   [DISCONTINUED] atorvastatin (LIPITOR) 40 MG tablet TAKE 1 TABLET(40 MG) BY MOUTH DAILY   [DISCONTINUED] indapamide (LOZOL) 1.25 MG tablet TAKE 1 TABLET(1.25 MG) BY MOUTH DAILY. INCREASE TO 2 TABLETS. IF TOLERATING WELL IN SEVERAL DAYS   Current Facility-Administered Medications for the 08/23/20 encounter (Office Visit) with Loel Dubonnet, NP  Medication   sodium chloride flush (NS) 0.9 % injection 3 mL     Review of Systems    All other systems reviewed and are otherwise negative except as noted above.  Physical Exam    VS:  BP 124/80   Pulse 84   Ht 5\' 9"  (1.753 m)   Wt 227 lb (103 kg)   SpO2 97%   BMI 33.52 kg/m  , BMI Body mass index is 33.52 kg/m.  Wt Readings from Last 3 Encounters:  08/23/20 227 lb (103 kg)  08/09/20 224 lb 12.8 oz (102 kg)  07/22/20 224 lb 3.2 oz (101.7 kg)    GEN: Well nourished, well developed, in no acute distress. HEENT: normal. Neck: Supple, no JVD, carotid bruits, or masses. Cardiac: RRR, no murmurs, rubs, or gallops. No clubbing, cyanosis. Trace pedal edema bilaterally.  Radials/PT 2+ and equal bilaterally.  Respiratory:  Respirations regular and unlabored, clear to auscultation bilaterally. GI: Soft, nontender, nondistended. MS: No deformity or atrophy. Skin: Warm and dry, no rash. Neuro:  Strength and sensation are intact. Psych: Normal  affect.  Assessment & Plan    HFrEF - Cardiac stress MRI 08/19/20 with basal to mid inferior stress perfusion defect consistent with ischemia, EF 34%. Plan for left heart catheterization. BMP and CBC for pre-procedure labs ordered. Patient self reports history of coronary stent 10 years ago at [facility], details unknown and unable to locate in [place]. GDMT includes Coreg 12.5mg  BID, Entresto 24-26mg  BID, Indapamide 1.25mg  QD. Future considerations include addition of [place] - as he is without insurance will need to fill out patient assistance application. Entresto patient assistance application completed previously by pharmacist - per [employer] will be reviewed in next 3-5 days. He has 4 tablets and additional 1 week supply of samples provided. Heart healthy diet and regular cardiovascular exercise encouraged. Consider referral to cardiac rehab at follow up.   Shared Decision Making/Informed Consent{ The risks [stroke (1 in 1000), death (1 in 1000), kidney failure [usually temporary] (1 in 500), bleeding (1 in 200), allergic reaction [possibly serious] (1 in 200)], benefits (diagnostic  support and management of coronary artery disease) and alternatives of a cardiac catheterization were discussed in detail with Thomas Spears and he is willing to proceed.  Hypertension with severe LVH - Cardiac stress MRI 08/19/20 with asymmetric LVH up to 75mm inbasal septum differential hypertrophic cardiomyopathy vs hyperteisve heart disease. Likely HCM comonent given extent of hypertrophy and significant amount of LGE. BP well controlled. Continue current antihypertensive regimen.    Severe ascending aortic dilation- 4.7 cm by CTA 05/22/2020.  Ascending aorta 45 mm by cardiac stress MRI. Plan to refer to cardiothoracic surgery at follow up. Continue optimal BP control, aspirin, statin.   CAD - Patient reported history of stent x1 10 years ago. Details unknown. GDMT includes Aspirin 81mg  daily, Coreg 12.5mg   BID, Atorvastatin 40mg  QD.   HLD - Continue Atorvasatin 40mg  daily. 05/23/20 LDL 96. LDL goal <70. Notes interruption in Atorvasatin for last 5 days, refill provided, defer lipid recheck at this time.   Valvular heart disease - Moderate AI, mild to moderate MR by cardiac stress MRI 08/19/20. Continue optimal BP and volume control. Periodic echocardiogram for monitoring.   Tobacco use - Smoking cessation encouraged. Recommend utilization of [email or web address].  Disposition: Follow up in 3 week(s) with Dr. Johney Frame or APP  Signed, Loel Dubonnet, NP 08/23/2020, 7:16 PM [place]

## 2020-08-24 ENCOUNTER — Telehealth: Payer: Self-pay | Admitting: Pharmacist

## 2020-08-24 NOTE — Telephone Encounter (Signed)
Pt approved for Entresto 24-26mg  BID through [employer] pt assistance until 08/24/21. Pt can coordinate shipment and med refills at [phone].  Called pt and left him a message to make him aware.

## 2020-08-25 NOTE — Telephone Encounter (Signed)
2nd message left for pt.

## 2020-08-26 ENCOUNTER — Other Ambulatory Visit: Payer: Self-pay

## 2020-08-26 ENCOUNTER — Other Ambulatory Visit: Payer: Self-pay | Admitting: *Deleted

## 2020-08-26 DIAGNOSIS — Z720 Tobacco use: Secondary | ICD-10-CM

## 2020-08-26 DIAGNOSIS — I517 Cardiomegaly: Secondary | ICD-10-CM

## 2020-08-26 DIAGNOSIS — E782 Mixed hyperlipidemia: Secondary | ICD-10-CM

## 2020-08-26 DIAGNOSIS — I10 Essential (primary) hypertension: Secondary | ICD-10-CM

## 2020-08-26 DIAGNOSIS — I7121 Aneurysm of the ascending aorta, without rupture: Secondary | ICD-10-CM

## 2020-08-26 DIAGNOSIS — I5020 Unspecified systolic (congestive) heart failure: Secondary | ICD-10-CM

## 2020-08-26 LAB — CBC
Hematocrit: 40.6 % (ref 37.5–51.0)
Hemoglobin: 14 g/dL (ref 13.0–17.7)
MCH: 28.5 pg (ref 26.6–33.0)
MCHC: 34.5 g/dL (ref 31.5–35.7)
MCV: 83 fL (ref 79–97)
Platelets: 270 10*3/uL (ref 150–450)
RBC: 4.91 x10E6/uL (ref 4.14–5.80)
RDW: 13.8 % (ref 11.6–15.4)
WBC: 10.9 10*3/uL — ABNORMAL HIGH (ref 3.4–10.8)

## 2020-08-26 LAB — BASIC METABOLIC PANEL
BUN/Creatinine Ratio: 13 (ref 9–20)
BUN: 15 mg/dL (ref 6–24)
CO2: 21 mmol/L (ref 20–29)
Calcium: 9.6 mg/dL (ref 8.7–10.2)
Chloride: 102 mmol/L (ref 96–106)
Creatinine, Ser: 1.19 mg/dL (ref 0.76–1.27)
Glucose: 110 mg/dL — ABNORMAL HIGH (ref 65–99)
Potassium: 3.9 mmol/L (ref 3.5–5.2)
Sodium: 139 mmol/L (ref 134–144)
eGFR: 77 mL/min/{1.73_m2} (ref >59)

## 2020-08-30 ENCOUNTER — Telehealth: Payer: Self-pay | Admitting: *Deleted

## 2020-08-30 NOTE — Telephone Encounter (Signed)
Patient returning call.

## 2020-08-30 NOTE — Telephone Encounter (Addendum)
Pt contacted pre-catheterization scheduled at [facility] for: Wednesday August 31, 2020 8:30 AM Verified arrival time and place: [facility]) at: 6:30 AM   No solid food after midnight prior to cath, clear liquids until 5 AM day of procedure.  AM meds can be  taken pre-cath with sips of water including: aspirin 81 mg   Confirmed patient has responsible adult to drive home post procedure and be with patient first 24 hours after arriving home: yes  You are allowed ONE visitor in the waiting room during the time you are at the hospital for your procedure. Both you and your visitor must wear a mask once you enter the hospital.   Patient reports does not currently have any symptoms concerning for COVID-19 and no household members with COVID-19 like illness.       Reviewed procedure/mask/visitor instructions with patient.

## 2020-08-30 NOTE — Telephone Encounter (Signed)
Reviewed procedure/mask/visitor instructions with patient. 

## 2020-08-31 ENCOUNTER — Ambulatory Visit (HOSPITAL_COMMUNITY)
Admission: RE | Admit: 2020-08-31 | Discharge: 2020-08-31 | Disposition: A | Discharge status: Home / Self Care | Payer: Self-pay | Attending: Cardiology | Admitting: Cardiology

## 2020-08-31 ENCOUNTER — Other Ambulatory Visit: Payer: Self-pay

## 2020-08-31 ENCOUNTER — Encounter (HOSPITAL_COMMUNITY)
Admission: RE | Disposition: A | Discharge status: Home / Self Care | Payer: Self-pay | Source: Home / Self Care | Attending: Cardiology

## 2020-08-31 ENCOUNTER — Encounter (HOSPITAL_COMMUNITY): Payer: Self-pay | Admitting: Cardiology

## 2020-08-31 DIAGNOSIS — I110 Hypertensive heart disease with heart failure: Secondary | ICD-10-CM | POA: Insufficient documentation

## 2020-08-31 DIAGNOSIS — I712 Thoracic aortic aneurysm, without rupture, unspecified: Secondary | ICD-10-CM | POA: Diagnosis present

## 2020-08-31 DIAGNOSIS — Z7982 Long term (current) use of aspirin: Secondary | ICD-10-CM | POA: Insufficient documentation

## 2020-08-31 DIAGNOSIS — E785 Hyperlipidemia, unspecified: Secondary | ICD-10-CM | POA: Insufficient documentation

## 2020-08-31 DIAGNOSIS — I5022 Chronic systolic (congestive) heart failure: Secondary | ICD-10-CM | POA: Insufficient documentation

## 2020-08-31 DIAGNOSIS — I420 Dilated cardiomyopathy: Secondary | ICD-10-CM | POA: Diagnosis present

## 2020-08-31 DIAGNOSIS — Z955 Presence of coronary angioplasty implant and graft: Secondary | ICD-10-CM | POA: Insufficient documentation

## 2020-08-31 DIAGNOSIS — I340 Nonrheumatic mitral (valve) insufficiency: Secondary | ICD-10-CM | POA: Insufficient documentation

## 2020-08-31 DIAGNOSIS — I5020 Unspecified systolic (congestive) heart failure: Secondary | ICD-10-CM | POA: Diagnosis present

## 2020-08-31 DIAGNOSIS — Z79899 Other long term (current) drug therapy: Secondary | ICD-10-CM | POA: Insufficient documentation

## 2020-08-31 DIAGNOSIS — R9389 Abnormal findings on diagnostic imaging of other specified body structures: Secondary | ICD-10-CM

## 2020-08-31 DIAGNOSIS — Z91018 Allergy to other foods: Secondary | ICD-10-CM | POA: Insufficient documentation

## 2020-08-31 DIAGNOSIS — I998 Other disorder of circulatory system: Secondary | ICD-10-CM

## 2020-08-31 DIAGNOSIS — I25118 Atherosclerotic heart disease of native coronary artery with other forms of angina pectoris: Secondary | ICD-10-CM | POA: Insufficient documentation

## 2020-08-31 HISTORY — PX: LEFT HEART CATH AND CORONARY ANGIOGRAPHY: CATH118249

## 2020-08-31 SURGERY — LEFT HEART CATH AND CORONARY ANGIOGRAPHY
Anesthesia: LOCAL

## 2020-08-31 MED ORDER — ASPIRIN 81 MG PO CHEW: 81.0000 mg | CHEWABLE_TABLET | ORAL | Status: DC

## 2020-08-31 MED ORDER — CARVEDILOL 12.5 MG PO TABS: 12.5000 mg | ORAL_TABLET | Freq: Two times a day (BID) | ORAL | 3 refills | Status: DC

## 2020-08-31 MED ORDER — HEPARIN (PORCINE) IN NACL 1000-0.9 UT/500ML-% IV SOLN
INTRAVENOUS | Status: DC | PRN
  Administered 2020-08-31 (×2): 500 mL

## 2020-08-31 MED ORDER — HEPARIN (PORCINE) IN NACL 1000-0.9 UT/500ML-% IV SOLN
INTRAVENOUS | Status: AC
  Filled 2020-08-31: qty 1000

## 2020-08-31 MED ORDER — SODIUM CHLORIDE 0.9% FLUSH: 3.0000 mL | INTRAVENOUS | Status: DC | PRN

## 2020-08-31 MED ORDER — MIDAZOLAM HCL 2 MG/2ML IJ SOLN
INTRAMUSCULAR | Status: AC
  Filled 2020-08-31: qty 2

## 2020-08-31 MED ORDER — SODIUM CHLORIDE 0.9 % IV SOLN: INTRAVENOUS | Status: DC

## 2020-08-31 MED ORDER — SODIUM CHLORIDE 0.9% FLUSH: 3.0000 mL | Freq: Two times a day (BID) | INTRAVENOUS | Status: DC

## 2020-08-31 MED ORDER — VERAPAMIL HCL 2.5 MG/ML IV SOLN
INTRAVENOUS | Status: AC
  Filled 2020-08-31: qty 2

## 2020-08-31 MED ORDER — LABETALOL HCL 5 MG/ML IV SOLN: 10.0000 mg | INTRAVENOUS | Status: DC | PRN

## 2020-08-31 MED ORDER — LIDOCAINE HCL (PF) 1 % IJ SOLN
INTRAMUSCULAR | Status: AC
  Filled 2020-08-31: qty 30

## 2020-08-31 MED ORDER — HEPARIN SODIUM (PORCINE) 1000 UNIT/ML IJ SOLN
INTRAMUSCULAR | Status: DC | PRN
  Administered 2020-08-31: 5000 [IU] via INTRAVENOUS

## 2020-08-31 MED ORDER — HYDRALAZINE HCL 20 MG/ML IJ SOLN: 10.0000 mg | INTRAMUSCULAR | Status: DC | PRN

## 2020-08-31 MED ORDER — MIDAZOLAM HCL 2 MG/2ML IJ SOLN
INTRAMUSCULAR | Status: DC | PRN
  Administered 2020-08-31: 2 mg via INTRAVENOUS
  Administered 2020-08-31: 1 mg via INTRAVENOUS

## 2020-08-31 MED ORDER — SODIUM CHLORIDE 0.9 % IV SOLN: 250.0000 mL | INTRAVENOUS | Status: DC | PRN

## 2020-08-31 MED ORDER — HEPARIN SODIUM (PORCINE) 1000 UNIT/ML IJ SOLN
INTRAMUSCULAR | Status: AC
  Filled 2020-08-31: qty 1

## 2020-08-31 MED ORDER — ONDANSETRON HCL 4 MG/2ML IJ SOLN: 4.0000 mg | Freq: Four times a day (QID) | INTRAMUSCULAR | Status: DC | PRN

## 2020-08-31 MED ORDER — FENTANYL CITRATE (PF) 100 MCG/2ML IJ SOLN
INTRAMUSCULAR | Status: DC | PRN
  Administered 2020-08-31: 25 ug via INTRAVENOUS

## 2020-08-31 MED ORDER — VERAPAMIL HCL 2.5 MG/ML IV SOLN
INTRAVENOUS | Status: DC | PRN
  Administered 2020-08-31: 10 mL via INTRA_ARTERIAL

## 2020-08-31 MED ORDER — ACETAMINOPHEN 325 MG PO TABS: 650.0000 mg | ORAL_TABLET | ORAL | Status: DC | PRN

## 2020-08-31 MED ORDER — FENTANYL CITRATE (PF) 100 MCG/2ML IJ SOLN
INTRAMUSCULAR | Status: AC
  Filled 2020-08-31: qty 2

## 2020-08-31 MED ORDER — IOHEXOL 350 MG/ML SOLN
INTRAVENOUS | Status: DC | PRN
  Administered 2020-08-31: 130 mL

## 2020-08-31 MED ORDER — LIDOCAINE HCL (PF) 1 % IJ SOLN
INTRAMUSCULAR | Status: DC | PRN
  Administered 2020-08-31: 2 mL

## 2020-08-31 SURGICAL SUPPLY — 14 items
CATH INFINITI 5FR JL4 (CATHETERS) ×2 IMPLANT
CATH INFINITI 5FR JL5 (CATHETERS) ×2 IMPLANT
CATH INFINITI JR4 5F (CATHETERS) ×2 IMPLANT
CATH OPTITORQUE TIG 4.0 5F (CATHETERS) ×2 IMPLANT
CATH VISTA GUIDE 6FR XBLAD3.5 (CATHETERS) ×2 IMPLANT
DEVICE RAD COMP TR BAND LRG (VASCULAR PRODUCTS) ×2 IMPLANT
GLIDESHEATH SLEND SS 6F .021 (SHEATH) ×2 IMPLANT
KIT HEART LEFT (KITS) ×2 IMPLANT
PACK CARDIAC CATHETERIZATION (CUSTOM PROCEDURE TRAY) ×2 IMPLANT
SHEATH PROBE COVER 6X72 (BAG) ×2 IMPLANT
TRANSDUCER W/STOPCOCK (MISCELLANEOUS) ×2 IMPLANT
TUBING CIL FLEX 10 FLL-RA (TUBING) ×2 IMPLANT
WIRE HI TORQ VERSACORE-J 145CM (WIRE) ×2 IMPLANT
WIRE ROSEN-J .035X260CM (WIRE) ×2 IMPLANT

## 2020-08-31 NOTE — Discharge Instructions (Signed)
ca

## 2020-08-31 NOTE — Interval H&P Note (Signed)
History and Physical Interval Note:  08/31/2020 8:33 AM  Thomas Spears  has presented today for surgery, with the diagnosis of heart failure.  The various methods of treatment have been discussed with the patient and family. After consideration of risks, benefits and other options for treatment, the patient has consented to  Procedure(s): LEFT HEART CATH AND CORONARY ANGIOGRAPHY (N/A)  PERCUTANEOUS CORONARY INTERVENTION  as a surgical intervention.  The patient's history has been reviewed, patient examined, no change in status, stable for surgery.  I have reviewed the patient's chart and labs.  Questions were answered to the patient's satisfaction.    Cath Lab Visit (complete for each Cath Lab visit)  Clinical Evaluation Leading to the Procedure:   ACS: No.  Non-ACS:    Anginal Classification: No Symptoms nyha chf class ii  Anti-ischemic medical therapy: Minimal Therapy (1 class of medications)  Non-Invasive Test Results: High-risk stress test findings: cardiac mortality >3%/year  Prior CABG: No previous CABG    Thomas Spears

## 2020-09-01 ENCOUNTER — Other Ambulatory Visit: Payer: Self-pay | Admitting: *Deleted

## 2020-09-01 ENCOUNTER — Ambulatory Visit (INDEPENDENT_AMBULATORY_CARE_PROVIDER_SITE_OTHER): Payer: Self-pay

## 2020-09-01 ENCOUNTER — Other Ambulatory Visit: Payer: Self-pay | Admitting: Cardiology

## 2020-09-01 ENCOUNTER — Encounter: Payer: Self-pay | Admitting: *Deleted

## 2020-09-01 ENCOUNTER — Telehealth: Payer: Self-pay | Admitting: *Deleted

## 2020-09-01 DIAGNOSIS — I493 Ventricular premature depolarization: Secondary | ICD-10-CM

## 2020-09-01 DIAGNOSIS — I420 Dilated cardiomyopathy: Secondary | ICD-10-CM

## 2020-09-01 NOTE — Progress Notes (Unsigned)
Patient enrolled for [employer] to mail a 14 day ZIO XT long term holter monitor to address on file. Letter with instructions and Patient Assistance Program information mailed to patient.

## 2020-09-01 NOTE — Telephone Encounter (Signed)
3rd message left for pt. If he does not call back, will advise him of approval at next PharmD visit on 7/12 for continued CHF med optimization with plan to change indapamide and amlodipine to spironolactone and continue increasing beta blocker and Entresto as able.

## 2020-09-01 NOTE — Telephone Encounter (Signed)
Spoke with pt and made him aware of the order for the monitor.  Reviewed instructions and advised pt to call the number on top of the box if any issues once it arrives.  Pt verbalized understanding and was in agreement with plan.

## 2020-09-01 NOTE — Telephone Encounter (Signed)
-----   Message from Freada Bergeron, MD sent at 09/01/2020  2:16 PM EDT ----- Can we send him a 2 week zio to monitor for ventricular ectopy? With concern for possibly hypertrophic cardiomyopathy, we just need to make sure he is not having a lot of extra beats.   Thank you so much!  -Nira Conn

## 2020-09-05 DIAGNOSIS — I493 Ventricular premature depolarization: Secondary | ICD-10-CM

## 2020-09-05 DIAGNOSIS — I420 Dilated cardiomyopathy: Secondary | ICD-10-CM

## 2020-09-06 ENCOUNTER — Ambulatory Visit (INDEPENDENT_AMBULATORY_CARE_PROVIDER_SITE_OTHER): Payer: Self-pay | Admitting: Pharmacist

## 2020-09-06 ENCOUNTER — Other Ambulatory Visit: Payer: Self-pay

## 2020-09-06 VITALS — BP 110/90 | HR 85 | Wt 225.6 lb

## 2020-09-06 DIAGNOSIS — I5020 Unspecified systolic (congestive) heart failure: Secondary | ICD-10-CM

## 2020-09-06 MED ORDER — SPIRONOLACTONE 25 MG PO TABS: 12.5000 mg | ORAL_TABLET | Freq: Every day | ORAL | 3 refills | Status: DC

## 2020-09-06 MED ORDER — ENTRESTO 24-26 MG PO TABS: 1.0000 | ORAL_TABLET | Freq: Two times a day (BID) | ORAL | 0 refills | Status: DC

## 2020-09-06 NOTE — Progress Notes (Signed)
Patient ID: Thomas Spears                 DOB: 11/07/1975                      MRN: [id number]     HPI: Thomas Spears is a 45 y.o. male referred by Dr. Johney Frame to pharmacy clinic for HF medication management. PMH is significant for newly diagnosed systolic heart failure with LVEF 25% with global hypokinesis, HTN, mild-to-moderate MR, severe aortic dilation measuring 57mm. Most recent LVEF 25% on 05/23/20.  At previous cardiology visit, pt reported many years of non-compliance with blood pressure medications, drinking energy drinks and soda regularly, drinking alcohol heavily and smoking 2 packs of cigarettes per day. But, he reported he now [place] adherent to this medications, has been clean for 15 years and is down to 6 cigs/day. At last visit, carvedilol was increased to 12.5mg  twice a day. He has since been approved for patient assistance for Entresto. He underwent a left heart cath which found mild to moderate disease but no obvious culprite to explain significant reduced EF. He has also been set up with a Zio patch to look for ventriclar ectopy.   Patient presents today to clinic. He denies dizziness, lightheadedness, headache, blurred vision, SOB or swelling. Has been compliant with his medications. He has not taken his medications yet today. Usually takes them at 9:00. Apt at 8:30. He is employed, but has to wait until Oct to sign up for benefits. Only has 2 days of Entresto left. [employer] while he was in the office to follow up on shipment of Entresto. They state they called patient on 6/30 and he said he would call for order. Had patient speak with them to set up shipment. Will come Friday. Therefore pt given another sample so he wouldn't run out. Reports taking indapamide BID. He didn't realize it had changed. He is out of amlodipine. Ready at pharmacy to pick up. Not drinking energy drinks or soda. Drinking water and sugar free cool aid with a little bit of sugar. Also coffee with 2  packets of sugar.  Wearing his monitor- just placed yesterday. Reports home BP 110-120/80-90   Current CHF meds: Entresto 24-26 mg BID, carvedilol 12.5 mg BID, amlodipine 10 mg daily, indapamide 1.25 mg daily (pedal edema) Previously tried: losartan, metoprolol succinate BP goal: <130/80 mmHg  Family History: Has a strong family history of CAD: his father had CABGx2 in his 96's-50's. 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  Past Medical History:  Diagnosis Date   Aortic regurgitation    07/2020 cardiac MRI moderate aortic regurgitation   Ascending aorta dilation (HCC)    05/01/20 CTA 4.7 cm. 08/19/20 cardiac stress MRI 47mm.  Chronic combined systolic and diastolic CHF (congestive heart failure) ([place])    Community acquired pneumonia    HLD (hyperlipidemia)    Hypertension    Hypertensive crisis 05/22/2020   Left ventricular hypertrophy    Mitral regurgitation    07/2020 cardiac MRI mild ot moderate MR   Tension headache 06/21/2020   Tobacco use     Current Outpatient Medications on File Prior to Visit  Medication Sig Dispense Refill   acetaminophen (TYLENOL) 500 MG tablet Take 1,000 mg by mouth every 6 (six) hours as needed (pain).     amLODipine (NORVASC) 10 MG tablet Take 1 tablet (10 mg total) by mouth daily. (Patient taking  differently: Take 10 mg by mouth every evening.) 30 tablet 11   aspirin EC 81 MG tablet Take 81 mg by mouth every evening. Swallow whole.     atorvastatin (LIPITOR) 40 MG tablet Take 1 tablet (40 mg total) by mouth daily. (Patient taking differently: Take 40 mg by mouth every evening.) 30 tablet 5   carvedilol (COREG) 12.5 MG tablet Take 1 tablet (12.5 mg total) by mouth 2 (two) times daily. This dose replaces the 6.25 mg dosing 60 tablet 3   indapamide (LOZOL) 1.25 MG tablet Take 1 tablet (1.25 mg total) by mouth daily. (Patient taking differently: Take 1.25 mg by mouth in the morning and at bedtime.) 30 tablet 5   sacubitril-valsartan (ENTRESTO) 24-26 MG Take 1 tablet by mouth 2 (two) times daily. 180 tablet 3   Current Facility-Administered Medications on File Prior to Visit  Medication Dose Route Frequency Provider Last Rate Last Admin   sodium chloride flush (NS) 0.9 % injection 3 mL  3 mL Intravenous Q12H Loel Dubonnet, NP        Allergies  Allergen Reactions   Mushroom Extract Complex Anaphylaxis     Assessment/Plan:  1. CHF - BP above goal <130/80 mmHg. Will STOP indapamide and START spironolactone 12.5mg  daily. He has apt with Laurann Montana on 7/20. Would recommend at that visit, it BMP is stable to increase Entresto to 49/51mg  BID and stop amlodipine. He is out of amlodipine- he pays cash for his medications. I advised he can either stop taking amlodipine now- or ask them to only fill a weeks supply for him. Follow up with me 2 weeks after visit with [facility] for further GDMT titration. Will have him fill out paperwork for [place] patient assistance at next visit.  I did warn him against adding sugar to his cool aid and watching his sugar intake as excessive sugar intake can lead to fatty liver and weight increase along with various other diseases.  Thank you,  Ramond Dial, Pharm.D, BCPS, CPP [facility]  [address] [place], [place] [address]   Phone: [phone]; Fax: [phone]

## 2020-09-06 NOTE — Patient Instructions (Addendum)
Please STOP taking indapamide STOP amlodipine (since you are already out or ask them to fill only 1 week supply)  START taking spironolactone 12.5mg  daily  Follow up with [facility] on 7/20  Continue checking blood pressure at home. Please bring your log with you next time.  Call me at [phone] with any questions

## 2020-09-14 ENCOUNTER — Encounter (HOSPITAL_BASED_OUTPATIENT_CLINIC_OR_DEPARTMENT_OTHER): Payer: Self-pay | Admitting: Family

## 2020-09-14 ENCOUNTER — Other Ambulatory Visit (HOSPITAL_BASED_OUTPATIENT_CLINIC_OR_DEPARTMENT_OTHER): Payer: Self-pay | Admitting: *Deleted

## 2020-09-14 ENCOUNTER — Ambulatory Visit (INDEPENDENT_AMBULATORY_CARE_PROVIDER_SITE_OTHER): Payer: Self-pay | Admitting: Family

## 2020-09-14 ENCOUNTER — Other Ambulatory Visit: Payer: Self-pay

## 2020-09-14 VITALS — BP 126/98 | HR 71 | Ht 69.0 in | Wt 232.0 lb

## 2020-09-14 DIAGNOSIS — I77810 Thoracic aortic ectasia: Secondary | ICD-10-CM

## 2020-09-14 DIAGNOSIS — I5020 Unspecified systolic (congestive) heart failure: Secondary | ICD-10-CM

## 2020-09-14 DIAGNOSIS — I420 Dilated cardiomyopathy: Secondary | ICD-10-CM

## 2020-09-14 DIAGNOSIS — I10 Essential (primary) hypertension: Secondary | ICD-10-CM

## 2020-09-14 DIAGNOSIS — I25118 Atherosclerotic heart disease of native coronary artery with other forms of angina pectoris: Secondary | ICD-10-CM

## 2020-09-14 MED ORDER — ENTRESTO 49-51 MG PO TABS
1.0000 | ORAL_TABLET | Freq: Two times a day (BID) | ORAL | 0 refills | Status: DC
Start: 2020-09-14 — End: 2020-09-14

## 2020-09-14 MED ORDER — ENTRESTO 49-51 MG PO TABS: 1.0000 | ORAL_TABLET | Freq: Two times a day (BID) | ORAL | 3 refills | Status: DC
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
## 2020-09-14 NOTE — Patient Instructions (Addendum)
Medication Instructions:  Your physician has recommended you make the following change in your medication:   CHANGE your Entresto to 49-51mg  twice daily  *You may take two of your 24-26mg  tablets twice daily to use up your current supply*  *If you need a refill on your cardiac medications before your next appointment, please call your pharmacy*   Lab Work: Your physician recommends lab work: BMP  If you have labs (blood work) drawn today and your tests are completely normal, you will receive your results only by: [facility] (if you have MyChart) OR A paper copy in the mail If you have any lab test that is abnormal or we need to change your treatment, we will call you to review the results.   Testing/Procedures: None ordered today   Follow-Up: At [facility], you and your health needs are our priority.  As part of our continuing mission to provide you with exceptional heart care, we have created designated Provider Care Teams.  These Care Teams include your primary Cardiologist (physician) and Advanced Practice Providers (APPs -  Physician Assistants and Nurse Practitioners) who all work together to provide you with the care you need, when you need it.  We recommend signing up for the patient portal called "MyChart".  Sign up information is provided on this After Visit Summary.  MyChart is used to connect with patients for Virtual Visits (Telemedicine).  Patients are able to view lab/test results, encounter notes, upcoming appointments, etc.  Non-urgent messages can be sent to your provider as well.   To learn more about what you can do with MyChart, go to [email or web address].    Your next appointment:   As scheduled 09/27/20 with the pharmacist AND In 3 months with Dr. Johney Frame or APP  12/16/2020 ARRIVE AT 7:45 FOR AN 8:00 [place], NP  Other Instructions  Continue to follow with Dr. Orvan Seen for monitoring of your ascending aorta aneurysm.   Remove the  monitor when you get home.   Heart Healthy Diet Recommendations: A low-salt diet is recommended. Meats should be grilled, baked, or boiled. Avoid fried foods. Focus on lean protein sources like fish or chicken with vegetables and fruits. The American Heart Association is a Microbiologist!  American Heart Association Diet and Lifeystyle Recommendations   Exercise recommendations: The American Heart Association recommends 150 minutes of moderate intensity exercise weekly. Try 30 minutes of moderate intensity exercise 4-5 times per week. This could include walking, jogging, or swimming.

## 2020-09-15 MED ORDER — ENTRESTO 49-51 MG PO TABS: 1.0000 | ORAL_TABLET | Freq: Two times a day (BID) | ORAL | 3 refills | Status: DC

## 2020-09-15 NOTE — Addendum Note (Signed)
Addended by: Marcelle Overlie D on: 09/15/2020 07:23 AM   Modules accepted: Orders

## 2020-09-22 ENCOUNTER — Other Ambulatory Visit: Payer: Self-pay | Admitting: Family Medicine

## 2020-09-28 ENCOUNTER — Ambulatory Visit (INDEPENDENT_AMBULATORY_CARE_PROVIDER_SITE_OTHER): Payer: Self-pay | Admitting: Pharmacist

## 2020-09-28 ENCOUNTER — Other Ambulatory Visit: Payer: Self-pay

## 2020-09-28 VITALS — BP 150/110 | HR 85 | Wt 232.0 lb

## 2020-09-28 DIAGNOSIS — I5020 Unspecified systolic (congestive) heart failure: Secondary | ICD-10-CM

## 2020-09-28 LAB — BASIC METABOLIC PANEL WITH GFR
BUN/Creatinine Ratio: 12 (ref 9–20)
BUN: 14 mg/dL (ref 6–24)
CO2: 22 mmol/L (ref 20–29)
Calcium: 8.9 mg/dL (ref 8.7–10.2)
Chloride: 106 mmol/L (ref 96–106)
Creatinine, Ser: 1.19 mg/dL (ref 0.76–1.27)
Glucose: 119 mg/dL — ABNORMAL HIGH (ref 65–99)
Potassium: 4.1 mmol/L (ref 3.5–5.2)
Sodium: 141 mmol/L (ref 134–144)
eGFR: 77 mL/min/1.73

## 2020-09-28 MED ORDER — CARVEDILOL 25 MG PO TABS: 25.0000 mg | ORAL_TABLET | Freq: Two times a day (BID) | ORAL | 3 refills | Status: DC

## 2020-09-28 NOTE — Patient Instructions (Addendum)
For your acid frflux try:  Omeprazole - take at least 30 min prior to eating OR  Famotidine 10-'20mg'$  daily  Can use tums as needed  I will call you tomorrow with your lab results. Will plan to increase spironolactone to '25mg'$  (1 tablet) daily.  START taking carvedilol '25mg'$  twice a day, continue Entresto 49/'51mg'$  twice a day  I will submit the application for Farxiga to the patient assistance program and let you know when I hear back.

## 2020-09-28 NOTE — Progress Notes (Signed)
Patient ID: Thomas Spears                 DOB: 04-Mar-1975                      MRN: [id number]     HPI: Thomas Spears is a 45 y.o. male referred by Dr. Johney Spears to pharmacy clinic for HF medication management. PMH is significant for newly diagnosed systolic heart failure with LVEF 25% with global hypokinesis, HTN, mild-to-moderate MR, severe aortic dilation measuring 43m. Most recent LVEF 25% on 05/23/20.  At previous cardiology visit, pt reported many years of non-compliance with blood pressure medications, drinking energy drinks and soda regularly, drinking alcohol heavily and smoking 2 packs of cigarettes per day. But, he reported he now [place]herent to this medications, has been clean for 15 years and is down to 6 cigs/day. At previous visit, carvedilol was increased to 12.'5mg'$  twice a day. He has since been approved for patient assistance for Entresto. He underwent a left heart cath which found mild to moderate disease but no obvious culprite to explain significant reduced EF. He has also been set up with a Zio patch to look for ventriclar ectopy.   At last visit with PharmD his indapamide was stopped and spironolactone 12.'5mg'$  daily was started. He saw CLaurann Montanaon 7/20 and his EDelene Lollwas increased to 49/'51mg'$  BID and his amlodipine was stopped.  Patient presents today to clinic. He denies dizziness, lightheadedness, headache, blurred vision, SOB or swelling. He has occasional swelling in ankle that resolves on its own. Has been compliant with his medications. He has not taken his medications yet today however. He is employed, but has to wait until Oct to sign up for benefits. Reports waking up with a burning in his throat and feeling like he cant breath. States he gets up, eats some Tums, drinks some milk and it resolves. Does not get SOB lying down.  He has received his Entresto shipment from [employer] Still taking 2 of the 24/'26mg'$  tablets he has, but has received the 49/'51mg'$  tablets. BP  ranges from 120-140/80-100 at home.   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  Past Medical History:  Diagnosis Date   Aortic regurgitation    07/2020 cardiac MRI moderate aortic regurgitation   Ascending aorta dilation (HCC)    05/01/20 CTA 4.7 cm. 08/19/20 cardiac stress MRI 475m   Chronic combined systolic and diastolic [facility] ([facility] failure[facility]   Community acquired pneumonia    HLD (hyperlipidemia)  Hypertension    Hypertensive crisis 05/22/2020   Left ventricular hypertrophy    Mitral regurgitation    07/2020 cardiac MRI mild ot moderate MR   Tension headache 06/21/2020   Tobacco use     Current Outpatient Medications on File Prior to Visit  Medication Sig Dispense Refill   acetaminophen (TYLENOL) 500 MG tablet Take 1,000 mg by mouth every 6 (six) hours as needed (pain).     aspirin EC 81 MG tablet Take 81 mg by mouth every evening. Swallow whole.     atorvastatin (LIPITOR) 40 MG tablet TAKE 1 TABLET(40 MG) BY MOUTH DAILY 30 tablet 3    carvedilol (COREG) 12.5 MG tablet Take 1 tablet (12.5 mg total) by mouth 2 (two) times daily. This dose replaces the 6.25 mg dosing 60 tablet 3   sacubitril-valsartan (ENTRESTO) 49-51 MG Take 1 tablet by mouth 2 (two) times daily. 180 tablet 3   spironolactone (ALDACTONE) 25 MG tablet Take 0.5 tablets (12.5 mg total) by mouth daily. 15 tablet 3   No current facility-administered medications on file prior to visit.    Allergies  Allergen Reactions   Mushroom Extract Complex Anaphylaxis     Assessment/Plan:  1. CHF - BP above goal <130/80 mmHg. He has not taken AM meds yet, but his home readings are above goal as well. This is a little to be expected as we have stopped his non GDMT medications in order to allow BP room to optimize GDMT doses. Patient took AM medications while in clinic. Will increase carvedilol to '25mg'$  BID. Will check BMP today since increasing Entresto. If stable, will increase spironolactone to '25mg'$  daily. He has filled out the patient assistance paper-work for [place]. I will get MD to sign and will fax to Thomas Spears and Me. Follow up in 3 weeks (pt can only make early AM apt and that was the closest one available).  Thank you,  Thomas Spears, Pharm.D, BCPS, CPP [facility]  [id number], [place], [place] [address]  Phone: [phone]; Fax: [phone]

## 2020-09-29 ENCOUNTER — Telehealth: Payer: Self-pay | Admitting: Pharmacist

## 2020-09-29 ENCOUNTER — Encounter: Payer: Self-pay | Admitting: Cardiothoracic Surgery

## 2020-09-29 MED ORDER — SPIRONOLACTONE 25 MG PO TABS: 25.0000 mg | ORAL_TABLET | Freq: Every day | ORAL | 3 refills | Status: DC

## 2020-09-29 NOTE — Telephone Encounter (Signed)
Labs stable. Will increase spironolactone to '25mg'$  daily. Called and spoke with patient. He is agreeable with plan. Recheck BMP at next apt.

## 2020-09-29 NOTE — Addendum Note (Signed)
Addended by: Marcelle Overlie D on: 09/29/2020 09:48 AM   Modules accepted: Orders

## 2020-09-29 NOTE — Progress Notes (Signed)
[address]       [place] [address]             [phone]     CARDIOTHORACIC SURGERY CONSULTATION REPORT  Referring Provider is McDiarmid, Blane Ohara, MD Primary Cardiologist is Freada Bergeron, MD PCP is Lattie Haw, MD  Chief Complaint  Patient presents with   Thoracic Aortic Aneurysm    Surgical consult, CTA Chest 05/22/20, ECHO 05/23/20    HPI:  45 year old man was referred for ascending aortic aneurysm.  He has a history of carotid stenting and depressed ventricular function.  He presented with cough to the emergency department recently where chest CT was performed demonstrating an ascending aortic aneurysm measuring approximately 5 cm in maximal diameter.  This was confirmed with an echocardiogram around the same time and also showed ejection fraction of the left ventricle of 25% without significant valvular disease.  He has no personal history of aneurysm.  His past medical history is otherwise notable for hypertension.  Past Medical History:  Diagnosis Date   Aortic regurgitation    07/2020 cardiac MRI moderate aortic regurgitation   Ascending aorta dilation (HCC)    05/01/20 CTA 4.7 cm. 08/19/20 cardiac stress MRI 11m.   Chronic combined systolic and diastolic CHF (cong[facility]ive heart failure) ([facility]    Community acquired pneumonia    HLD (hyperlipidemia)    Hypertension    Hypertensive crisis 05/22/2020   Left ventricular hypertrophy    Mitral regurgitation    07/2020 cardiac MRI mild ot moderate MR   Tension headache 06/21/2020   Tobacco use     Past Surgical History:  Procedure Laterality Date   CAROTID STENT INSERTION     LEFT HEART CATH AND CORONARY ANGIOGRAPHY N/A 08/31/2020   Procedure: LEFT HEART CATH AND CORONARY ANGIOGRAPHY;  Surgeon: HLeonie Man MD;  [facility];  Service: Cardiovascular;  Laterality: N/A;    No family history on file.  Social History   Socioeconomic History   Marital status: Single     Spouse name: Not on file   Number of children: Not on file   Years of education: Not on file   Highest education level: Not on file  Occupational History   Not on file  Tobacco Use   Smoking status: Light Smoker   Smokeless tobacco: Never  Substance and Sexual Activity   Alcohol use: Not Currently   Drug use: Not on file   Sexual activity: Yes  Other Topics Concern   Not on file  Social History Narrative   Not on file   Social Determinants of Health   Financial Resource Strain: Not on file  Food Insecurity: Not on file  Transportation Needs: Not on file  Physical Activity: Not on file  Stress: Not on file  Social Connections: Not on file  Intimate Partner Violence: Not on file    Current Outpatient Medications  Medication Sig Dispense Refill   acetaminophen (TYLENOL) 500 MG tablet Take 1,000 mg by mouth every 6 (six) hours as needed (pain).     aspirin EC 81 MG tablet Take 81 mg by mouth every evening. Swallow whole.     atorvastatin (LIPITOR) 40 MG tablet TAKE 1 TABLET(40 MG) BY MOUTH DAILY 30 tablet 3   carvedilol (COREG) 25 MG tablet Take 1 tablet (25 mg total) by mouth 2 (two) times daily. 180 tablet 3   sacubitril-valsartan (ENTRESTO) 49-51 MG Take 1 tablet by mouth 2 (two)  times daily. 180 tablet 3   spironolactone (ALDACTONE) 25 MG tablet Take 1 tablet (25 mg total) by mouth daily. 90 tablet 3   No current facility-administered medications for this visit.    Allergies  Allergen Reactions   Mushroom Extract Complex Anaphylaxis      Review of Systems:   General:  No change in appetite or weight  Cardiac:  History of heart failure  Respiratory:  History of cough as noted  GI:   Occasional constipation  GU:   Negative for prostate or kidney disease  Vascular:  History of extracranial cerebrovascular disease  Neuro:   No strokes or TIAs  Musculoskeletal: No arthritis or musculoskeletal disorders  Skin:   Denies rash  Psych:   No stress or  anxiety  Eyes:   Negative  ENT:   Negative  Hematologic:  Negative  Endocrine:  No diabetes or thyroid disorders     Physical Exam:   BP (!) 140/95   Pulse 95   Temp 97.6 F (36.4 C) (Skin)   Resp 20   Ht '5\' 9"'$  (1.753 m)   Wt 102.1 kg   SpO2 97% Comment: RA  BMI 33.23 kg/m   General:    well-appearing  HEENT:  Unremarkable   Neck:   no JVD, no bruits, no adenopathy   Chest:   clear to auscultation, symmetrical breath sounds, no wheezes, no rhonchi   CV:   RRR, no detectable murmur   Abdomen:  soft, non-tender, no masses   Extremities:  warm, well-perfused, pulses intact throughout, no LE edema  Rectal/GU  Deferred  Neuro:   Grossly non-focal and symmetrical throughout  Skin:   Clean and dry, no rashes, no breakdown   Diagnostic Tests:  I personally reviewed his echocardiogram and his CT scan performed recently which shows a 5 cm ascending aortic aneurysm   Impression:  45 year old man with incidentally discovered ascending aortic aneurysm although there is a history of cardiac disease   Plan:  Suggest cardiology evaluation for depressed EF Follow-up in 6 months for repeat assessment of the ascending aortic aneurysm   I spent in excess of 30 minutes during the conduct of this office consultation and >50% of this time involved direct face-to-face encounter with the patient for counseling and/or coordination of their care.          Level 3 Office Consult = 40 minutes         Level 4 Office Consult = 60 minutes         Level 5 Office Consult = 80 minutes  B.  Murvin Natal, MD 09/29/2020 11:18 AM

## 2020-10-07 ENCOUNTER — Telehealth: Payer: Self-pay | Admitting: Pharmacist

## 2020-10-07 NOTE — Telephone Encounter (Signed)
Patient [place] for [place] and [place] - pt assistance for Farxiga though 09/29/21. Medication will be delivered 8/16. Next apt with me is 8/25. Will hold off starting until he sees me in clinic. Called and LVM for him to call back.

## 2020-10-20 ENCOUNTER — Ambulatory Visit: Payer: Self-pay

## 2020-10-25 ENCOUNTER — Ambulatory Visit (INDEPENDENT_AMBULATORY_CARE_PROVIDER_SITE_OTHER): Payer: Self-pay | Admitting: Pharmacist

## 2020-10-25 ENCOUNTER — Other Ambulatory Visit: Payer: Self-pay

## 2020-10-25 VITALS — BP 150/100 | HR 71 | Wt 233.0 lb

## 2020-10-25 DIAGNOSIS — I5020 Unspecified systolic (congestive) heart failure: Secondary | ICD-10-CM

## 2020-10-25 LAB — BASIC METABOLIC PANEL
BUN/Creatinine Ratio: 14 (ref 9–20)
BUN: 18 mg/dL (ref 6–24)
CO2: 22 mmol/L (ref 20–29)
Calcium: 9.5 mg/dL (ref 8.7–10.2)
Chloride: 104 mmol/L (ref 96–106)
Creatinine, Ser: 1.29 mg/dL — ABNORMAL HIGH (ref 0.76–1.27)
Glucose: 82 mg/dL (ref 65–99)
Potassium: 4 mmol/L (ref 3.5–5.2)
Sodium: 139 mmol/L (ref 134–144)
eGFR: 70 mL/min/{1.73_m2} (ref >59)

## 2020-10-25 NOTE — Progress Notes (Signed)
Patient ID: Thomas Spears                 DOB: 11-03-1975                      MRN: [id number]     HPI: Thomas Spears is a 45 y.o. male referred by Dr. Johney Frame to pharmacy clinic for HF medication management. PMH is significant for newly diagnosed systolic heart failure with LVEF 25% with global hypokinesis, HTN, mild-to-moderate MR, severe aortic dilation measuring 92m. Most recent LVEF 25% on 05/23/20.  At previous cardiology visit, pt reported many years of non-compliance with blood pressure medications, drinking energy drinks and soda regularly, drinking alcohol heavily and smoking 2 packs of cigarettes per day. But, he reported he now [place]herent to this medications, has been clean for 15 years and is down to 6 cigs/day. At previous visit, carvedilol was increased to 12.'5mg'$  twice a day. He has since been approved for patient assistance for Entresto. He underwent a left heart cath which found mild to moderate disease but no obvious culprite to explain significant reduced EF. He has also been set up with a Zio patch to look for ventriclar ectopy.   At last visit with PharmD his carvedilol was increased to '25mg'$  twice a day and spironolactone was increased to '25mg'$  daily. In the interrum, he was approved for [place].   Patient presents today to clinic. He denies dizziness, lightheadedness, headache, blurred vision, SOB or swelling. He has had a couple times tightness in his chest that has gone away on its own. Admits could have been reflux. Still taking 2 of the 24/'26mg'$  tablets he has, but has received the 49/'51mg'$  tablets. Started taking FWilder Gladeabout 2 weeks ago. BP ranges from 145-150/80-100 at home per pt report.   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  Past Medical History:  Diagnosis Date   Aortic regurgitation    07/2020 cardiac MRI moderate aortic regurgitation   Ascending aorta dilation (HCC)    05/01/20 CTA 4.7 cm. 08/19/20 cardiac stress MRI 49m   Chronic combined systolic and diastolic CH[facility] (congestive heart failure) ([facility]   Community acquired pneumonia    HLD (hyperlipidemia)    Hypertension    Hypertensive crisis 05/22/2020   Left ventricular hypertrophy    Mitral regurgitation    07/2020 cardiac MRI mild ot moderate MR   Tension headache 06/21/2020   Tobacco use     Current Outpatient Medications on File Prior to Visit  Medication Sig Dispense Refill  acetaminophen (TYLENOL) 500 MG tablet Take 1,000 mg by mouth every 6 (six) hours as needed (pain).     aspirin EC 81 MG tablet Take 81 mg by mouth every evening. Swallow whole.     atorvastatin (LIPITOR) 40 MG tablet TAKE 1 TABLET(40 MG) BY MOUTH DAILY 30 tablet 3   carvedilol (COREG) 25 MG tablet Take 1 tablet (25 mg total) by mouth 2 (two) times daily. 180 tablet 3   sacubitril-valsartan (ENTRESTO) 49-51 MG Take 1 tablet by mouth 2 (two) times daily. 180 tablet 3   spironolactone (ALDACTONE) 25 MG tablet Take 1 tablet (25 mg total) by mouth daily. 90 tablet 3   No current  facility-administered medications on file prior to visit.    Allergies  Allergen Reactions   Mushroom Extract Complex Anaphylaxis     Assessment/Plan:  1. CHF - BP above goal <130/80 mmHg. No improvement in BP with increases in his spironolactone or carvedilol. I did confirm with him all his medications. He states he took this medications this AM. Will check BMP since increasing spironolactone and starting [place]. If stable, will increase Entresto to 97/'103mg'$  BID. Will call patient tomorrow to confirm. I suspect that patient will also need an additional medication for his blood pressure (most likely hydralazine). Follow up in 2 weeks in clinic.  Thank you,  Ramond Dial, Pharm.D, BCPS, CPP [facility]  [id number], [place], [place] [address]  Phone: [phone]; Fax: [phone]

## 2020-10-25 NOTE — Patient Instructions (Addendum)
Please continue to check your blood pressure at home.  Please bring your log with you to your next appointment.  If your blood work looks good, we will plan to increase Entresto to 97/'103mg'$  twice a day. I will call you tomorrow to confirm  Continue carvedilol 25 mg BID, spironolactone '25mg'$  daily and Farxiga '10mg'$  daily

## 2020-10-26 ENCOUNTER — Telehealth: Payer: Self-pay | Admitting: Pharmacist

## 2020-10-26 NOTE — Telephone Encounter (Signed)
BMET from yesterday is overall stable stable, slight increase in SCr from 1.19 up to 1.29, but pt's baseline ranges from 1.2-1.48 over the past 6 months.  Will increase Entresto to 97-'103mg'$  BID. Pt still has some of his 24-'26mg'$  tablets and received a shipment of the 49-'51mg'$  dose that he hasn't started on yet.  He is aware to take 4 of the 24-'26mg'$  tablets BID or 2 of the 49-'51mg'$  tablets BID. Will hold off on sending in new rx until f/u labs are checked at next appt in 2 weeks to confirm that he'll stay on highest dose.

## 2020-11-04 ENCOUNTER — Other Ambulatory Visit: Payer: Self-pay | Admitting: Surgery

## 2020-11-04 DIAGNOSIS — I712 Thoracic aortic aneurysm, without rupture, unspecified: Secondary | ICD-10-CM

## 2020-11-11 ENCOUNTER — Ambulatory Visit: Payer: Self-pay

## 2020-11-11 NOTE — Progress Notes (Deleted)
Patient ID: [name]                 DOB: November 20, 1975                      MRN: [id number]     HPI: Thomas Spears is a 45 y.o. male referred by Dr. Johney Spears to pharmacy clinic for HF medication management. PMH is significant for newly diagnosed systolic heart failure with LVEF 25% with global hypokinesis, HTN, mild-to-moderate MR, severe aortic dilation measuring 73m. Most recent LVEF 25% on 05/23/20.  At previous cardiology visit, pt reported many years of non-compliance with blood pressure medications, drinking energy drinks and soda regularly, drinking alcohol heavily and smoking 2 packs of cigarettes per day. But, he reported he now u[place]herent to this medications, has been clean for 15 years and is down to 6 cigs/day. He has since been [place] He underwent a left heart cath which found mild to moderate disease but no obvious culprite to explain significant reduced EF. He has also been set up with a Zio patch to look for ventriclar ectopy.   At last visit with PharmD his EDelene Lollwas increased to 97/'103mg'$  twice a day as blood pressure was elevated (150/100).  Check BMP Compliance hydralazine    Patient presents today to clinic. He denies dizziness, lightheadedness, headache, blurred vision, SOB or swelling. He has had a couple times tightness in his chest that has gone away on its own. Admits could have been reflux. Still taking 2 of the 24/'26mg'$  tablets he has, but has received the 49/'51mg'$  tablets. Started taking FWilder Gladeabout 2 weeks ago. BP ranges from 145-150/80-100 at home per pt report.   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  Past Medical History:  Diagnosis Date   Aortic regurgitation    07/2020 cardiac MRI moderate aortic regurgitation   Ascending aorta dilation (HCC)    05/01/20 CTA 4.7 cm. 08/19/20 cardiac stress MRI 460m   Chronic combined systolic and diastolic CHF (cong[facility]ive heart failure) ([facility]   Community acquired pneumonia    HLD (hyperlipidemia)    Hypertension    Hypertensive crisis 05/22/2020   Left ventricular hypertrophy    Mitral regurgitation    07/2020 cardiac MRI mild ot moderate MR   Tension headache 06/21/2020   Tobacco use     Current Outpatient Medications on File Prior to Visit  Medication Sig Dispense Refill   acetaminophen (TYLENOL) 500 MG tablet Take 1,000 mg by mouth every 6 (six) hours as needed (pain).     aspirin EC 81  MG tablet Take 81 mg by mouth every evening. Swallow whole.     atorvastatin (LIPITOR) 40 MG tablet TAKE 1 TABLET(40 MG) BY MOUTH DAILY 30 tablet 3   carvedilol (COREG) 25 MG tablet Take 1 tablet (25 mg total) by mouth 2 (two) times daily. 180 tablet 3   sacubitril-valsartan (ENTRESTO) 49-51 MG Take 2 tablets by mouth 2 (two) times daily.     spironolactone (ALDACTONE) 25 MG tablet Take 1 tablet (25 mg total) by mouth daily. 90 tablet 3   No current facility-administered medications on file prior to visit.    Allergies  Allergen Reactions   Mushroom Extract Complex Anaphylaxis      Assessment/Plan:  1. CHF - BP above goal <130/80 mmHg. No improvement in BP with increases in his spironolactone or carvedilol. I did confirm with him all his medications. He states he took this medications this AM. Will check BMP since increasing spironolactone and starting [place]. If stable, will increase Entresto to 97/'103mg'$  BID. Will call patient tomorrow to confirm. I suspect that patient will also need an additional medication for his blood pressure (most likely hydralazine). Follow up in 2 weeks in clinic.  Thank you,  Thomas Spears, Pharm.D, BCPS, CPP [facility]  [id number] [place], [place] [address]  Phone: [phone]; Fax: [phone]

## 2020-11-16 ENCOUNTER — Ambulatory Visit: Payer: Self-pay | Admitting: Pharmacist

## 2020-11-16 NOTE — Progress Notes (Deleted)
Patient ID: Thomas Spears                 DOB: 1975/03/10                      MRN: [id number]     HPI: Thomas Spears is a 45 y.o. male referred by Dr. Johney Frame to pharmacy clinic for HF medication management. PMH is significant for newly diagnosed systolic heart failure with LVEF 25% with global hypokinesis, CAD s/p stenting in his 57s, HTN, mild-to-moderate MR, severe aortic dilation measuring 61mm. Cath 08/2020 showed diffuse mild to moderate multivessel CAD. Most recent LVEF 25% on 05/23/20.  Has previously reported many years of non-compliance with blood pressure medications, drinking energy drinks and soda regularly, drinking alcohol heavily and smoking 2 packs of cigarettes per day. Since then, has improved adherence to his medications, stopped drinking alcohol, and down to 6 cigarettes/day. He does not have medication insurance and has been started on both Entresto and Farxiga through Doctor, hospital patient assistance programs.  Home bp readings? Tolerating higher dose of entresto? Send in new rx for entresto 97-103 if bmet today stable Confirm if pt on farxiga, med was not on his list, getting pt assistance? How much smoking? 6 cigs/day before Can inc spiro if needed and labs stable, otherwise hydral  LDL 96 and goal < 55, inc atorva to 80 ($16/1 month goodrx at [facility] or $39 for 3 months) and add zetia (~$30 for 90 day supply or 7-15 for 30 day)  Current CHF meds: Entresto 97-103mg  BID, carvedilol 25mg  BID, spironolactone 25mg  daily, Farxiga 10mg  daily Previously tried: losartan, metoprolol succinate BP goal: <130/80 mmHg  Family History: Has a strong family history of CAD: his father had CABGx2 in his 1's-50's. 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  Past Medical History:  Diagnosis Date   Aortic regurgitation    07/2020 cardiac MRI moderate aortic regurgitation   Ascending aorta dilation (HCC)    05/01/20 CTA 4.7 cm. 08/19/20 cardiac stress MRI 38mm.   Chronic combined systolic and diastolic CHF (congestive heart failure) ([place])    Community acquired pneumonia    HLD (hyperlipidemia)    Hypertension    Hypertensive crisis 05/22/2020   Left ventricular hypertrophy    Mitral regurgitation    07/2020 cardiac MRI mild ot moderate MR   Tension headache 06/21/2020   Tobacco use     Current Outpatient Medications on File Prior to Visit  Medication Sig Dispense Refill   acetaminophen (TYLENOL) 500 MG tablet Take 1,000 mg by mouth every 6 (six) hours as needed (pain).     aspirin EC 81 MG tablet Take 81 mg by mouth every evening. Swallow whole.     atorvastatin (LIPITOR) 40 MG tablet TAKE 1 TABLET(40 MG) BY MOUTH DAILY 30 tablet  3   carvedilol (COREG) 25 MG tablet Take 1 tablet (25 mg total) by mouth 2 (two) times daily. 180 tablet 3   sacubitril-valsartan (ENTRESTO) 49-51 MG Take 2 tablets by mouth 2 (two) times daily.     spironolactone (ALDACTONE) 25 MG tablet Take 1 tablet (25 mg total) by mouth daily. 90 tablet 3   No current facility-administered medications on file prior to visit.    Allergies  Allergen Reactions   Mushroom Extract Complex Anaphylaxis     Assessment/Plan:  1. CHF - BP above goal <130/80 mmHg. No improvement in BP with increases in his spironolactone or carvedilol. I did confirm with him all his  medications. He states he took this medications this AM. Will check BMP since increasing spironolactone and starting [place]. If stable, will increase Entresto to 97/103mg  BID. Will call patient tomorrow to confirm. I suspect that patient will also need an additional medication for his blood pressure (most likely hydralazine). Follow up in 2 weeks in clinic.

## 2020-12-01 NOTE — Progress Notes (Signed)
Patient ID: Thomas Spears                 DOB: 04-29-1975                      MRN: [id number]    HPI: Thomas Spears is a 45 y.o. male referred by Dr. Johney Spears to pharmacy clinic for HF medication management. PMH is significant for newly diagnosed systolic heart failure with LVEF 25% with global hypokinesis, HTN, mild-to-moderate MR, severe aortic dilation measuring 87mm. Most recent LVEF 25% on 05/23/20.  Patient was last seen 10/25/2020 with no improvement in BP after carvedilol was increased to 25 mg BID, spironolactone was increased to 25 mg. He also was approved for patient assistance and started on Farxiga at that visit. Entresto was increased to 97-103 mg BID as patient's Scr/K+ was stable. (Baseline Scr 1.2-1.48).  Today he returns to pharmacy clinic for further medication titration.  Symptomatically, he is feeling some stable and occasional chest tightness/numbness in the legs, but otherwise tolerating medications well. He does note nipple sensitivity within the last few weeks but tolerable. He also reports dizziness when going from sitting to laying only. He takes his morning medications at 8am and second dose at 8pm every day with good adherence. Of note, he has longstanding history of recurrent daily headaches.  New prescription for Entresto 97-103 mg tablet BID was sent today through patient assistance, although pt states he may already have rx for the 49-51mg  dosing on the way. Advised him if he receives this dose instead, to double up and continue taking 2 tabs BID.  Patient down to smoking 4 cigarettes per day down from 6 and previously 2 packs per day. Notes activity outdoors makes him crave cigarettes more.  Current CHF meds: Entresto 97-103 mg BID, carvedilol 25 mg BID, spironolactone 25mg  daily (8am), Farxiga 10mg  daily (8am) Previously tried: losartan, metoprolol succinate BP goal: <130/80  Family History: Has a strong family history of CAD: his father had CABGx2 in his  46's-50's. 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  Doesn't add salt to food. Likes chicken and fish.  Exercise: Patient states he exercises regularly, yard work. Walking 1 mile a day. Hikes on the weekend.  Home BP readings: 166/110, 146/108, bicep cuff for many years. Home BP arm cuff is 64-72 years old and readings are much higher at home (150/100) than in office today (126/100).  Wt Readings from Last 3 Encounters:  12/02/20 229 lb (103.9 kg)  10/25/20 233 lb (105.7 kg)  09/28/20 232 lb (105.2 kg)   BP Readings from Last 3 Encounters:  12/02/20 (!) 126/100  10/25/20 (!) 150/100  09/28/20 (!) 150/110   Pulse Readings from Last 3 Encounters:  12/02/20 67  10/25/20 71  09/28/20 85    Renal function: CrCl cannot be calculated (Patient's most recent lab result is older than the maximum 21 days allowed.).  Past Medical History:  Diagnosis Date   Aortic regurgitation    07/2020 cardiac MRI moderate aortic regurgitation   Ascending aorta dilation (HCC)    05/01/20 CTA 4.7 cm. 08/19/20 cardiac stress MRI 70mm.   Chronic combined systolic and diastolic CHF (congestive heart failure) ([place])  Community acquired pneumonia    HLD (hyperlipidemia)    Hypertension    Hypertensive crisis 05/22/2020   Left ventricular hypertrophy    Mitral regurgitation    07/2020 cardiac MRI mild ot moderate MR   Tension headache 06/21/2020   Tobacco use     Current Outpatient Medications on File Prior to Visit  Medication Sig Dispense Refill   dapagliflozin propanediol (FARXIGA) 10 MG TABS tablet Take 10 mg by mouth daily.     acetaminophen (TYLENOL) 500 MG tablet Take 1,000 mg by mouth every 6 (six) hours as needed (pain).     aspirin  EC 81 MG tablet Take 81 mg by mouth every evening. Swallow whole.     spironolactone (ALDACTONE) 25 MG tablet Take 1 tablet (25 mg total) by mouth daily. 90 tablet 3   No current facility-administered medications on file prior to visit.    Allergies  Allergen Reactions   Mushroom Extract Complex Anaphylaxis     Assessment/Plan:  1. CHF -  Patient is on target doses of GDMT for HF however BP not at goal < 130/80. His office systolic BP is closer to goal of <951/88 but his diastolic remains elevated. Will increase carvedilol to 37.5 mg BID. Will continue Farxiga 10mg  daily, spironolactone 25mg  daily, and Entresto 97-103mg  BID for CHF. Checking BMET todoay with recent dose increase of Entresto. Discussed changing spironolactone to eplerenone given nipple sensitivity, however cost is notably higher with eplerenone and pt still without insurance. Can reconsider in the future when pt gets insurance. Patient was also advised to switch Kool-aid to sugar free option and encouraged to aim for less than 2 grams of sodium each day and at least 150 minutes of exercise per week as well as to continue monitoring BP at home. Pt sees APP for follow up in 2 weeks, can follow up with PharmD after if needed.  Pt seen with Cyd Silence  Pharm D. Candidate  [facility], PharmD, BCACP, [facility] [address], [place], [place] [address] Phone: [phone]; Fax: [phone] 12/02/2020 12:26 PM

## 2020-12-02 ENCOUNTER — Other Ambulatory Visit: Payer: Self-pay

## 2020-12-02 ENCOUNTER — Telehealth: Payer: Self-pay | Admitting: Pharmacist

## 2020-12-02 ENCOUNTER — Ambulatory Visit (INDEPENDENT_AMBULATORY_CARE_PROVIDER_SITE_OTHER): Payer: Self-pay | Admitting: Pharmacist

## 2020-12-02 VITALS — BP 126/100 | HR 67 | Wt 229.0 lb

## 2020-12-02 DIAGNOSIS — I5020 Unspecified systolic (congestive) heart failure: Secondary | ICD-10-CM

## 2020-12-02 LAB — BASIC METABOLIC PANEL
BUN/Creatinine Ratio: 14 (ref 9–20)
BUN: 17 mg/dL (ref 6–24)
CO2: 22 mmol/L (ref 20–29)
Calcium: 9.5 mg/dL (ref 8.7–10.2)
Chloride: 104 mmol/L (ref 96–106)
Creatinine, Ser: 1.21 mg/dL (ref 0.76–1.27)
Glucose: 87 mg/dL (ref 70–99)
Potassium: 4.4 mmol/L (ref 3.5–5.2)
Sodium: 140 mmol/L (ref 134–144)
eGFR: 75 mL/min/{1.73_m2} (ref >59)

## 2020-12-02 MED ORDER — CARVEDILOL 25 MG PO TABS: 37.5000 mg | ORAL_TABLET | Freq: Two times a day (BID) | ORAL | 3 refills | Status: DC

## 2020-12-02 MED ORDER — CARVEDILOL 25 MG PO TABS: 37.5000 mg | ORAL_TABLET | Freq: Two times a day (BID) | ORAL | 11 refills | Status: DC

## 2020-12-02 MED ORDER — ATORVASTATIN CALCIUM 40 MG PO TABS: ORAL_TABLET | ORAL | 3 refills | Status: DC

## 2020-12-02 MED ORDER — ENTRESTO 97-103 MG PO TABS: 1.0000 | ORAL_TABLET | Freq: Two times a day (BID) | ORAL | 1 refills | Status: DC

## 2020-12-02 NOTE — Telephone Encounter (Signed)
   Pt is requesting to speak with Jinny Blossom again, he said it is important about his medications

## 2020-12-02 NOTE — Patient Instructions (Addendum)
It was nice to see you today  Your blood pressure goal is < 130/50mmHg  Increase your carvedilol to 1.5 tablets (37.5mg ) twice daily  I sent in a refill on the higher dose of your [name] - this is a 97-103mg  tablet that you'll take 1 tablet twice daily of  Continue taking your other medications and keep an eye on your blood pressure at home  Change to sugar-free kool aid  Aim for < 2,000mg  of sodium each day  Aim for 150 minutes of exercise each day

## 2020-12-02 NOTE — Telephone Encounter (Addendum)
Called pt, states that his Entresto delivery won't come until next Tuesday and he runs out tomorrow morning. They also won't ship out the 97-103mg  dose because the 49-51mg  dose is already getting picked up by [employer] today. Pt missed his appt 2 weeks ago which would have avoided this issue as new rx would have been sent in before he ran out. He will need to keep doubling up on his 49-51mg  tablet, pharmacy states they should be able to process the 97-103mg  dose early at the 6 week mark. Will leave 1 bottle of samples up front for pt so that he does not run out.  He also states higher tablet count of carvedilol is > $150 at his pharmacy. New rx sent to [facility] where copay will be $15.

## 2020-12-05 MED ORDER — CARVEDILOL 25 MG PO TABS: 37.5000 mg | ORAL_TABLET | Freq: Two times a day (BID) | ORAL | 3 refills | Status: DC

## 2020-12-05 NOTE — Addendum Note (Signed)
Addended by: Zairah Arista E on: 12/05/2020 08:18 AM   Modules accepted: Orders

## 2020-12-16 ENCOUNTER — Other Ambulatory Visit: Payer: Self-pay

## 2020-12-16 ENCOUNTER — Ambulatory Visit (INDEPENDENT_AMBULATORY_CARE_PROVIDER_SITE_OTHER): Payer: Self-pay | Admitting: Family

## 2020-12-16 ENCOUNTER — Encounter (HOSPITAL_BASED_OUTPATIENT_CLINIC_OR_DEPARTMENT_OTHER): Payer: Self-pay | Admitting: Family

## 2020-12-16 VITALS — BP 144/100 | HR 64 | Ht 69.0 in | Wt 229.8 lb

## 2020-12-16 DIAGNOSIS — E785 Hyperlipidemia, unspecified: Secondary | ICD-10-CM

## 2020-12-16 DIAGNOSIS — I10 Essential (primary) hypertension: Secondary | ICD-10-CM

## 2020-12-16 DIAGNOSIS — I5042 Chronic combined systolic (congestive) and diastolic (congestive) heart failure: Secondary | ICD-10-CM

## 2020-12-16 DIAGNOSIS — I77810 Thoracic aortic ectasia: Secondary | ICD-10-CM

## 2020-12-16 DIAGNOSIS — Z720 Tobacco use: Secondary | ICD-10-CM

## 2020-12-16 LAB — COMPREHENSIVE METABOLIC PANEL
ALT: 29 IU/L (ref 0–44)
AST: 29 IU/L (ref 0–40)
Albumin/Globulin Ratio: 1.4 (ref 1.2–2.2)
Albumin: 3.9 g/dL — ABNORMAL LOW (ref 4.0–5.0)
Alkaline Phosphatase: 82 IU/L (ref 44–121)
BUN/Creatinine Ratio: 13 (ref 9–20)
BUN: 16 mg/dL (ref 6–24)
Bilirubin Total: 0.3 mg/dL (ref 0.0–1.2)
CO2: 22 mmol/L (ref 20–29)
Calcium: 9.7 mg/dL (ref 8.7–10.2)
Chloride: 105 mmol/L (ref 96–106)
Creatinine, Ser: 1.2 mg/dL (ref 0.76–1.27)
Globulin, Total: 2.8 g/dL (ref 1.5–4.5)
Glucose: 95 mg/dL (ref 70–99)
Potassium: 4.6 mmol/L (ref 3.5–5.2)
Sodium: 139 mmol/L (ref 134–144)
Total Protein: 6.7 g/dL (ref 6.0–8.5)
eGFR: 76 mL/min/{1.73_m2} (ref >59)

## 2020-12-16 LAB — LIPID PANEL
Chol/HDL Ratio: 4.3 ratio (ref 0.0–5.0)
Cholesterol, Total: 119 mg/dL (ref 100–199)
HDL: 28 mg/dL — ABNORMAL LOW (ref >39)
LDL Chol Calc (NIH): 64 mg/dL (ref 0–99)
Triglycerides: 154 mg/dL — ABNORMAL HIGH (ref 0–149)
VLDL Cholesterol Cal: 27 mg/dL (ref 5–40)

## 2020-12-16 MED ORDER — HYDRALAZINE HCL 25 MG PO TABS: 25.0000 mg | ORAL_TABLET | Freq: Two times a day (BID) | ORAL | 3 refills | Status: DC

## 2020-12-16 NOTE — Progress Notes (Signed)
Office Visit    Patient Name: Thomas Spears Date of Encounter: 12/16/2020  PCP:  Lattie Haw, MD   [facility]  Cardiologist:  Freada Bergeron, MD  Advanced Practice Provider:  No care team member to display Electrophysiologist:  None   Chief Complaint    Thomas Spears is a 45 y.o. male with a hx of CAD, systolic heart failure with LVEF 25% with global hypokinesis/NICM, hypertension, mild to moderate MR, severe aortic dilation measuring 50 mm of LVH presents today for NICM.  Past Medical History    Past Medical History:  Diagnosis Date   Aortic regurgitation    07/2020 cardiac MRI moderate aortic regurgitation   Ascending aorta dilation (HCC)    05/01/20 CTA 4.7 cm. 08/19/20 cardiac stress MRI 87mm.   Chronic combined systolic and diastolic CHF (congestive heart failure) ([place])    Community acquired pneumonia    HLD (hyperlipidemia)    Hypertension    Hypertensive crisis 05/22/2020   Left ventricular hypertrophy    Mitral regurgitation    07/2020 cardiac MRI mild ot moderate MR   Tension headache 06/21/2020   Tobacco use    Past Surgical History:  Procedure Laterality Date   CAROTID STENT INSERTION     LEFT HEART CATH AND CORONARY ANGIOGRAPHY N/A 08/31/2020   Procedure: LEFT HEART CATH AND CORONARY ANGIOGRAPHY;  Surgeon: Leonie Man, MD;  Location: [facility];  Service: Cardiovascular;  Laterality: N/A;    Allergies  Allergies  Allergen Reactions   Mushroom Extract Complex Anaphylaxis    History of Present Illness    [name] is a 45 y.o. male with a hx of CAD , systolic heart failure with LVEF 25% with global hypokinesis, hypertension / NICM, mild to moderate MR, severe aortic dilation measuring 50 mm, LVH last seen by pharmacist 12/02/20  He was hospitalized at [facility] 04/2020 with hypertensive crisis.  CTA chest with no PE but showed thoracic aortic aneurysm 4.7 cm no evidence of pulmonary hypertension.  TTE  05/23/2020 LVEF 25% with global hypokinesis, severe LVH, grade 2 diastolic dysfunction, mild RV dysfunction, severe LAE, moderate RAE, mild to moderate MR, trivial AI, severe aortic dilation, small pericardial effusion.  He left AMA prior to completing work-up.  He was seen in clinic 07/23/2018 by Dr. Johney Frame.  He reported several years of not taking his medication and uncontrolled hypertension.  Also regularly drinking energy drinks, soda, smoking 2 packs/day.  He used to be a heavy drinker.  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  He was taking all of his blood pressure medications and feeling overall well.  He displayed NYHA I-II symptoms.  He was recommended for stress MRI to assess for ischemia and evidence of amyloidosis.  His losartan was transitioned to [employer], metoprolol transition to carvedilol.  He was referred to the hypertension clinic.  Seen by pharmacy team 08/09/20 and Carvedilol increased to 12.5mg  QD.   He had cardiac stress MRI 08/19/20 showing basal to mid inferior stress perfusion defect consistent with ischemia.  He had moderate LV dilation with moderate systolic dysfunction LVEF 34%.  Asymmetric LV hypertrophy measuring up to 39 mL basal septum differential including hypertrophic cardiomyopathy versus hypertensive heart disease.  No evidence of cardiac amyloidosis.  Normal RV size with mild systolic dysfunction, moderate aortic regurgitation, mild to moderate mitral regurgitation, ascending aortic aneurysm measuring 45 mm.Cardiac catheterization performed 7/07-2020 by Dr. Ellyn Hack showing diffuse mild to moderate multivessel disease, no culprit  lesion to explain reduced LVEF, no previously placed stent.  Seen by pharmacy team 09/06/20 and transitioned from indapamide to spironolactone. Seen 09/14/20 with [name] increased to 49-51mg  BID. 09/28/20 pharmacy increased Carvedilol to 25mg  BID and Spironolactone to 25mg  QD, Entresto increased to 97/103mg .   Last seen by pharmacy team  12/02/20. He was continuing to decrease smoking. He had been approved for [facility] as well as [place] patient assistance. His Carvedilol was increased to 37.5mg  QD as diastolic blood pressure still above goal. Transition from spironolactone to eplenerone was considered but deferred as he is without insurance.   He presents today for follow up. Reports no shortness of breath nor dyspnea on exertion. Reports no chest pain, pressure, or tightness. No edema, orthopnea, PND. Reports no palpitations.  BP at home 130s-140s. Diastolic BP very labile at home. Endorses following a heart healthy diet. 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 No formal exercise routine but does stay active playing with his 15 year old son.   EKGs/Labs/Other Studies Reviewed:   The following studies were reviewed today:  LHC 0 LV end diastolic pressure is moderately elevated. There is no aortic valve stenosis. Mid LM to Dist LM lesion is 20% stenosed. Ost LAD to Prox LAD lesion is 30% stenosed. Mid LAD lesion is 45% stenosed @ 1st Diag (40% pre & 50% post 1st Diag) Prox RCA lesion is 30% stenosed. Dist RCA lesion is 60% stenosed - discrete focal lesion, not flow limiting. No evidence of stent.   SUMMARY Diffuse mild to moderate multivessel disease but no obvious culprit lesion to explain significant reduced EF. Unable to visualize any previously placed stents. Moderately elevated LVEDP     RECOMMENDATIONS Continue to titrate GDMT for nonischemic CM. ->  Anticipate titrating up carvedilol to 12.5 mg twice daily and further titration of Entresto. May also consider spironolactone Cardiac MRI Stress 08/19/20 IMPRESSION: 1. Basal to mid inferior stress perfusion defect consistent with ischemia   2. Moderate LV dilatation with moderate systolic dysfunction (EF 09/01/20)   3. Asymmetric LV hypertrophy measuring up to 1mm in basal septum (66mm in posterior  wall). Differential includes hypertrophic cardiomyopathy vs hypertensive heart disease given history of severe uncontrolled hypertension. No evidence of cardiac amyloidosis. Patchy midwall LGE, which can be seen in either HCM or hypertensive heart disease. Suspect component of hypertensive heart disease, but also with likely HCM given extent of hypertrophy and significant amount of LGE, accounting for 12% of total myocardial mass   4.  Normal RV size with mild systolic dysfunction (EF 12m)   5.  Moderate aortic regurgitation (regurgitant fraction 27%)   6.  Mild to moderate mitral regurgitation (regurgitant fraction 21%)   7.  Ascending aortic aneurysm measuring 66mm  TTE 05/23/20: IMPRESSIONS   1. Left ventricular ejection fraction, by estimation, is 25%. The left  ventricle has severely decreased function. The left ventricle demonstrates  global hypokinesis. The left ventricular internal cavity size was mildly  dilated. There is severe left  ventricular hypertrophy raising concern cardiac amyloidosis versus  long-standing severe hypertension. Left ventricular diastolic parameters  are consistent with Grade II diastolic dysfunction (pseudonormalization).   2. Right ventricular systolic function is mildly reduced. The right  ventricular size is normal.   3. Left atrial size was severely dilated.   4. Right atrial size was moderately dilated.   5. The mitral valve is normal in structure. Mild to moderate mitral valve  regurgitation. No  evidence of mitral stenosis.   6. The aortic valve is tricuspid. Aortic valve regurgitation is trivial.  No aortic stenosis is present.   7. Aortic dilatation noted. There is severe dilatation of the ascending  aorta, measuring 50 mm.   8. The inferior vena cava is normal in size with <50% respiratory  variability, suggesting right atrial pressure of 8 mmHg.   9. A small pericardial effusion is present.   CTA 05/22/20: IMPRESSION: 1. No evidence  of acute pulmonary embolism. 2. Multifocal patchy airspace opacity within the right lung most pronounced within the right upper lobe. Findings are concerning for multifocal pneumonia. Follow-up to resolution is recommended. 3. 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 Recommend semi-annual imaging followup by CTA or MRA and referral to cardiothoracic surgery if not already obtained. This recommendation follows 2010 ACCF/AHA/AATS/ACR/ASA/SCA/SCAI/SIR/STS/SVM Guidelines for the Diagnosis and Management of Patients With Thoracic Aortic Disease. Circulation. 2010; 121: L937-T024. Aortic aneurysm NOS (ICD10-I71.9) 4. Marked four-chamber cardiomegaly with marked left ventricular hypertrophy. 5. Dilated main pulmonary trunk measuring 4.1 cm in diameter, suggesting pulmonary arterial hypertension. 6. Mildly enlarged mediastinal and right hilar lymph nodes, likely reactive.  EKG:  No EKG today.   Recent Labs: 05/22/2020: B Natriuretic Peptide 3,522.0 05/23/2020: ALT 55; TSH 0.400 08/26/2020: Hemoglobin 14.0; Platelets 270 12/02/2020: BUN 17; Creatinine, Ser 1.21; Potassium 4.4; Sodium 140  Recent Lipid Panel    Component Value Date/Time   CHOL 137 05/23/2020 0239   TRIG 80 05/23/2020 0239   HDL 25 (L) 05/23/2020 0239   CHOLHDL 5.5 05/23/2020 0239   VLDL 16 05/23/2020 0239   LDLCALC 96 05/23/2020 0239   Home Medications   Current Meds  Medication Sig   acetaminophen (TYLENOL) 500 MG tablet Take 1,000 mg by mouth every 6 (six) hours as needed (pain).   aspirin EC 81 MG tablet Take 81 mg by mouth every evening. Swallow whole.   atorvastatin (LIPITOR) 40 MG tablet TAKE 1 TABLET(40 MG) BY MOUTH DAILY   carvedilol (COREG) 25 MG tablet Take 1.5 tablets (37.5 mg total) by mouth 2 (two) times daily.   dapagliflozin propanediol (FARXIGA) 10 MG TABS tablet Take 10 mg by mouth daily.   sacubitril-valsartan (ENTRESTO) 97-103 MG Take 1 tablet by mouth 2 (two) times daily.    spironolactone (ALDACTONE) 25 MG tablet Take 1 tablet (25 mg total) by mouth daily.     Review of Systems    All other systems reviewed and are otherwise negative except as noted above.  Physical Exam    VS:  BP (!) 144/100   Pulse 64   Ht 5\' 9"  (1.753 m)   Wt 229 lb 12.8 oz (104.2 kg)   SpO2 96%   BMI 33.94 kg/m  , BMI Body mass index is 33.94 kg/m.  Wt Readings from Last 3 Encounters:  12/16/20 229 lb 12.8 oz (104.2 kg)  12/02/20 229 lb (103.9 kg)  10/25/20 233 lb (105.7 kg)    GEN: Well nourished, well developed, in no acute distress. HEENT: normal. Neck: Supple, no JVD, carotid bruits, or masses. Cardiac: RRR, no murmurs, rubs, or gallops. No clubbing, cyanosis. Trace pedal edema bilaterally.  Radials/PT 2+ and equal bilaterally.  Respiratory:  Respirations regular and unlabored, clear to auscultation bilaterally. GI: Soft, nontender, nondistended. MS: No deformity or atrophy. Skin: Warm and dry, no rash. Neuro:  Strength and sensation are intact. Psych: Normal affect.  Assessment & Plan    HFrEF - Cardiac stress MRI 08/19/20 with basal  to mid inferior stress perfusion defect consistent with ischemia, EF 34%.  LHC 08/31/2020 with mild to moderate nonobstructive coronary disease. GDMT includes Lisabeth Register, Carvedilol, Spironolactone. No need for loop diuretic. Add Hydralazine 25mg  BID today for optimal BP control. Approved for [employer] patient assistance. Heart healthy diet and regular cardiovascular exercise encouraged.  Repeat echo in 3 mos to reassess LVEF.   Hypertension with severe LVH - Cardiac stress MRI 08/19/20 with asymmetric LVH up to 70mm inbasal septum differential hypertrophic cardiomyopathy vs hyperteisve heart disease. Likely HCM comonent given extent of hypertrophy and significant amount of LGE. Add Hydralazine 25mg  BID for optimal BP control.  Severe ascending aortic dilation- 4.7 cm by CTA 05/22/2020.  Ascending aorta 45 mm by cardiac  stress MRI. Follow with Dr. Orvan Seen of cardiothoracic surgery. Upcoming appt 12/21/20. Continue optimal BP control, aspirin, statin.   CAD -LHC 08/31/2020 with mild to moderate nonobstructive disease.  GDMT includes Aspirin, Coreg, Atorvastatin. Stable with no anginal symptoms. No indication for ischemic evaluation.  Heart healthy diet and regular cardiovascular exercise encouraged.    HLD - Continue Atorvasatin 40mg  daily. 05/23/20 LDL 96. LDL goal <70.  Atorvasatin resumed 08/23/20.  Update lipid panel, CMP today.  Valvular heart disease - Moderate AI, mild to moderate MR by cardiac stress MRI 08/19/20. Continue optimal BP and volume control. Periodic echocardiogram for monitoring.   Tobacco use - Smoking cessation encouraged. Recommend utilization of [id number].  Disposition: Follow up in 3 mos with Dr. Johney Frame or APP  Signed, Loel Dubonnet, NP 12/16/2020, 8:07 AM [place]

## 2020-12-16 NOTE — Patient Instructions (Signed)
Medication Instructions:  Your physician has recommended you make the following change in your medication:   START Hydralazine one 25mg  tablet twice daily   *If you need a refill on your cardiac medications before your next appointment, please call your pharmacy*   Lab Work: Your physician recommends that you return for lab work today: CMP, lipid panel   If you have labs (blood work) drawn today and your tests are completely normal, you will receive your results only by: [place] (if you have MyChart) OR A paper copy in the mail If you have any lab test that is abnormal or we need to change your treatment, we will call you to review the results.   Testing/Procedures: Your physician has requested that you have an echocardiogram in 3 months. Echocardiography is a painless test that uses sound waves to create images of your heart. It provides your doctor with information about the size and shape of your heart and how well your heart's chambers and valves are working. This procedure takes approximately one hour. There are no restrictions for this procedure.    Follow-Up: At [facility], you and your health needs are our priority.  As part of our continuing mission to provide you with exceptional heart care, we have created designated Provider Care Teams.  These Care Teams include your primary Cardiologist (physician) and Advanced Practice Providers (APPs -  Physician Assistants and Nurse Practitioners) who all work together to provide you with the care you need, when you need it.  We recommend signing up for the patient portal called "MyChart".  Sign up information is provided on this After Visit Summary.  MyChart is used to connect with patients for Virtual Visits (Telemedicine).  Patients are able to view lab/test results, encounter notes, upcoming appointments, etc.  Non-urgent messages can be sent to your provider as well.   To learn more about what you can do with MyChart, go to  [email or web address].    Your next appointment:   3 month(s)  The format for your next appointment:   In Person  Provider:   You may see Freada Bergeron, MD or one of the following Advanced Practice Providers on your designated Care Team:   Richardson Dopp, PA-C [place], [place] Loel Dubonnet, NP   Other Instructions   Tips to Measure your Blood Pressure Correctly  To determine whether you have hypertension, a medical professional will take a blood pressure reading. How you prepare for the test, the position of your arm, and other factors can change a blood pressure reading by 10% or more. That could be enough to hide high blood pressure, start you on a drug you don't really need, or lead your doctor to incorrectly adjust your medications.  National and international guidelines offer specific instructions for measuring blood pressure. If a doctor, nurse, or medical assistant isn't doing it right, don't hesitate to ask him or her to get with the guidelines.  Here's what you can do to ensure a correct reading:  Don't drink a caffeinated beverage or smoke during the 30 minutes before the test.  Sit quietly for five minutes before the test begins.  During the measurement, sit in a chair with your feet on the floor and your arm supported so your elbow is at about heart level.  The inflatable part of the cuff should completely cover at least 80% of your upper arm, and the cuff should be placed on bare skin, not over a shirt.  Don't  talk during the measurement.  Have your blood pressure measured twice, with a brief break in between. If the readings are different by 5 points or more, have it done a third time.  In 2017, new guidelines from the [place], the [employer] of Cardiology, and nine other health organizations lowered the diagnosis of high blood pressure to 130/80 mm Hg or higher for all adults. The guidelines also redefined the various blood pressure  categories to now include normal, elevated, Stage 1 hypertension, Stage 2 hypertension, and hypertensive crisis (see "Blood pressure categories").  Blood pressure categories  Blood pressure category SYSTOLIC (upper number)  DIASTOLIC (lower number)  Normal Less than 120 mm Hg and Less than 80 mm Hg  Elevated 120-129 mm Hg and Less than 80 mm Hg  High blood pressure: Stage 1 hypertension 130-139 mm Hg or 80-89 mm Hg  High blood pressure: Stage 2 hypertension 140 mm Hg or higher or 90 mm Hg or higher  Hypertensive crisis (consult your doctor immediately) Higher than 180 mm Hg and/or Higher than 120 mm Hg  Source: American Heart Association and American Stroke Association. For more on getting your blood pressure under control, buy Controlling Your Blood Pressure, a Special Health Report from [facility].

## 2020-12-19 ENCOUNTER — Telehealth: Payer: Self-pay

## 2020-12-19 NOTE — Telephone Encounter (Signed)
-----   Message from Loel Dubonnet, NP sent at 12/19/2020 12:02 PM EDT ----- Normal kidney and liver function. Albumin mildly low, not of concern. Lipid panel shows LDL now at goal. Triglycerides slightly elevated, recommend reducing intake of sweets, sugars, carbohydrates.

## 2020-12-19 NOTE — Telephone Encounter (Signed)
Attempted phone call to pt. Per Epic ok to leave detailed voicemail message.  Advised per Laurann Montana: Normal kidney and liver function. Albumin mildly low, not of concern. Lipid panel shows LDL now at goal. Triglycerides slightly elevated, recommend reducing intake of sweets, sugars, carbohydrates.  Please contact office at [phone] for further questions or concerns.

## 2020-12-21 ENCOUNTER — Ambulatory Visit
Admission: RE | Admit: 2020-12-21 | Discharge: 2020-12-21 | Disposition: A | Discharge status: Home / Self Care | Payer: No Typology Code available for payment source | Source: Ambulatory Visit | Attending: Surgery | Admitting: Surgery

## 2020-12-21 ENCOUNTER — Other Ambulatory Visit: Payer: Self-pay

## 2020-12-21 ENCOUNTER — Ambulatory Visit (INDEPENDENT_AMBULATORY_CARE_PROVIDER_SITE_OTHER): Payer: Self-pay | Admitting: Physician Assistant

## 2020-12-21 ENCOUNTER — Ambulatory Visit: Payer: Self-pay | Admitting: Surgery

## 2020-12-21 VITALS — BP 130/84 | HR 77 | Resp 20 | Ht 69.0 in | Wt 229.0 lb

## 2020-12-21 DIAGNOSIS — I712 Thoracic aortic aneurysm, without rupture, unspecified: Secondary | ICD-10-CM

## 2020-12-21 NOTE — Progress Notes (Signed)
[address]       [place] [address]             [phone]     CARDIOTHORACIC SURGERY CONSULTATION REPORT  Referring Provider is McDiarmid, Blane Ohara, MD Primary Cardiologist is Freada Bergeron, MD PCP is Lattie Haw, MD  Chief Complaint  Patient presents with   Thoracic Aortic Aneurysm    Surgical consult, CTA Chest 05/22/20, ECHO 05/23/20    HPI:  45 year old man was referred for ascending aortic aneurysm.  He has a history of carotid stenting and depressed ventricular function.  He presented with cough to the emergency department recently where chest CT was performed demonstrating an ascending aortic aneurysm measuring approximately 5 cm in maximal diameter.  This was confirmed with an echocardiogram around the same time and also showed ejection fraction of the left ventricle of 25% without significant valvular disease.  He has no personal history of aneurysm.  His past medical history is otherwise notable for hypertension. Dad had coronary artery disease with CABG x 4 in his 27s.  He has not had any significant chest pain or shortness of breath.  He does have a cardiologist that has been following his echocardiograms.  He stays active running after his 86-year-old child.  He also is making a conscious effort to watch his diet and stay physically active.  Past Medical History:  Diagnosis Date   Aortic regurgitation    07/2020 cardiac MRI moderate aortic regurgitation   Ascending aorta dilation (HCC)    05/01/20 CTA 4.7 cm. 08/19/20 cardiac stress MRI 97mm.   Chronic combined systolic and diastolic CHF (congestive heart failure) ([place])    Community acquired pneumonia    HLD (hyperlipidemia)    Hypertension    Hypertensive crisis 05/22/2020   Left ventricular hypertrophy    Mitral regurgitation    07/2020 cardiac MRI mild ot moderate MR   Tension headache 06/21/2020   Tobacco use     Past Surgical History:  Procedure Laterality Date   CAROTID STENT  INSERTION     LEFT HEART CATH AND CORONARY ANGIOGRAPHY N/A 08/31/2020   Procedure: LEFT HEART CATH AND CORONARY ANGIOGRAPHY;  Surgeon: Leonie Man, MD;  Location: [facility];  Service: Cardiovascular;  Laterality: N/A;    No family history on file.  Social History   Socioeconomic History   Marital status: Single    Spouse name: Not on file   Number of children: Not on file   Years of education: Not on file   Highest education level: Not on file  Occupational History   Not on file  Tobacco Use   Smoking status: Light Smoker   Smokeless tobacco: Never  Substance and Sexual Activity   Alcohol use: Not Currently   Drug use: Not on file   Sexual activity: Yes  Other Topics Concern   Not on file  Social History Narrative   Not on file   Social Determinants of Health   Financial Resource Strain: Not on file  Food Insecurity: Not on file  Transportation Needs: Not on file  Physical Activity: Not on file  Stress: Not on file  Social Connections: Not on file  Intimate Partner Violence: Not on file    Current Outpatient Medications on File Prior to Visit  Medication Sig Dispense Refill   acetaminophen (TYLENOL) 500 MG tablet Take 1,000 mg by mouth every 6 (six) hours as needed (pain).     aspirin  EC 81 MG tablet Take 81 mg by mouth every evening. Swallow whole.     atorvastatin (LIPITOR) 40 MG tablet TAKE 1 TABLET(40 MG) BY MOUTH DAILY 90 tablet 3   carvedilol (COREG) 25 MG tablet Take 1.5 tablets (37.5 mg total) by mouth 2 (two) times daily. 270 tablet 3   dapagliflozin propanediol (FARXIGA) 10 MG TABS tablet Take 10 mg by mouth daily.     hydrALAZINE (APRESOLINE) 25 MG tablet Take 1 tablet (25 mg total) by mouth 2 (two) times daily. 60 tablet 3   sacubitril-valsartan (ENTRESTO) 97-103 MG Take 1 tablet by mouth 2 (two) times daily. 180 tablet 1   spironolactone (ALDACTONE) 25 MG tablet Take 1 tablet (25 mg total) by mouth daily. 90 tablet 3   No current  facility-administered medications on file prior to visit.      Allergies  Allergen Reactions   Mushroom Extract Complex Anaphylaxis       Physical Exam:    Vitals:   12/21/20 1321  BP: 130/84  Pulse: 77  Resp: 20  SpO2: 98%     General:    well-appearing  Neck:   no JVD, no bruits  Chest:   clear to auscultation, symmetrical breath sounds, no wheezes, no rhonchi   CV:   RRR, 1/6 systolic murmur  Abdomen:  soft, non-tender, no masses   Extremities:  warm, well-perfused, pulses intact throughout, no LE edema  Neuro:   Grossly non-focal and symmetrical throughout  Skin:   Clean and dry, no rashes, no breakdown   Diagnostic Tests:  CLINICAL DATA:  History of thoracic aortic aneurysm, follow-up evaluation in a 45 year old male.   EXAM: CT CHEST WITHOUT CONTRAST   TECHNIQUE: Multidetector CT imaging of the chest was performed following the standard protocol without IV contrast.   COMPARISON:  CT angiography of the chest of March of 2022.   FINDINGS: Cardiovascular: Ascending aorta of 4.7 cm in transverse dimension stable compared to previous imaging. Descending thoracic aorta 3.4 cm also unchanged. Coronal dimension of the ascending thoracic aorta approximately 4.3 cm stable when measured in a similar fashion as well on the previous imaging study. The heart size remains enlarged. Coronary artery calcification of LEFT coronary circulation similar to the prior study. No substantial pericardial effusion. Normal caliber of central pulmonary vessels. Limited assessment of cardiovascular structures given lack of intravenous contrast.   Mediastinum/Nodes: No thoracic inlet, axillary, mediastinal or hilar adenopathy. Esophagus grossly normal.   Lungs/Pleura: Lungs are clear. Airways are patent. Mild paraseptal emphysema at the lung apices.   Upper Abdomen: No acute abnormality.   Musculoskeletal: No chest wall mass or suspicious bone lesions identified.    IMPRESSION: Stable aneurysmal dilation of the ascending thoracic aorta measuring up to 4.7 cm in transverse dimension. 4.3 cm in greatest coronal dimension unchanged from previous imaging. Ascending thoracic aortic aneurysm. Recommend semi-annual imaging followup by CTA or MRA and referral to cardiothoracic surgery if not already obtained. This recommendation follows 2010 ACCF/AHA/AATS/ACR/ASA/SCA/SCAI/SIR/STS/SVM Guidelines for the Diagnosis and Management of Patients With Thoracic Aortic Disease. Circulation. 2010; 121: M010-U725. Aortic aneurysm NOS (ICD10-I71.9)   Stable cardiomegaly and coronary artery calcification.   Mild paraseptal emphysema at the lung apices.   Aortic Atherosclerosis (ICD10-I70.0) and Emphysema (ICD10-J43.9).     Electronically Signed   By: Zetta Bills M.D.   On: 12/21/2020 13:19   ECHOCARDIOGRAM REPORT         Patient Name:   NELVIN TOMB Date of Exam: 05/23/2020  Medical Rec #:  [id number]        Height:       69.0 in  Accession #:    [id number]       Weight:       223.3 lb  Date of Birth:  1975-10-24        BSA:          2.165 m  Patient Age:    73 years         BP:           194/139 mmHg  Patient Gender: M                HR:           98 bpm.  Exam Location:  Inpatient   Procedure: 2D Echo, Cardiac Doppler and Color Doppler   Indications:    I51.7 Cardiomegaly     History:        Patient has no prior history of Echocardiogram  examinations.                  Risk Factors:Hypertension. COVID-19 Positive.     Sonographer:    Tiffany Dance  Referring Phys: [id number] CARINA M BROWN   IMPRESSIONS     1. Left ventricular ejection fraction, by estimation, is 25%. The left  ventricle has severely decreased function. The left ventricle demonstrates  global hypokinesis. The left ventricular internal cavity size was mildly  dilated. There is severe left  ventricular hypertrophy raising concern cardiac amyloidosis versus  long-standing  severe hypertension. Left ventricular diastolic parameters  are consistent with Grade II diastolic dysfunction (pseudonormalization).   2. Right ventricular systolic function is mildly reduced. The right  ventricular size is normal.   3. Left atrial size was severely dilated.   4. Right atrial size was moderately dilated.   5. The mitral valve is normal in structure. Mild to moderate mitral valve  regurgitation. No evidence of mitral stenosis.   6. The aortic valve is tricuspid. Aortic valve regurgitation is trivial.  No aortic stenosis is present.   7. Aortic dilatation noted. There is severe dilatation of the ascending  aorta, measuring 50 mm.   8. The inferior vena cava is normal in size with <50% respiratory  variability, suggesting right atrial pressure of 8 mmHg.   9. A small pericardial effusion is present.   FINDINGS   Left Ventricle: Left ventricular ejection fraction, by estimation, is  25%. The left ventricle has severely decreased function. The left  ventricle demonstrates global hypokinesis. The left ventricular internal  cavity size was mildly dilated. There is  severe left ventricular hypertrophy. Left ventricular diastolic parameters  are consistent with Grade II diastolic dysfunction (pseudonormalization).   Right Ventricle: The right ventricular size is normal. No increase in  right ventricular wall thickness. Right ventricular systolic function is  mildly reduced.   Left Atrium: Left atrial size was severely dilated.   Right Atrium: Right atrial size was moderately dilated.   Pericardium: A small pericardial effusion is present.   Mitral Valve: The mitral valve is normal in structure. Mild to moderate  mitral valve regurgitation. No evidence of mitral valve stenosis.   Tricuspid Valve: The tricuspid valve is normal in structure. Tricuspid  valve regurgitation is not demonstrated.   Aortic Valve: The aortic valve is tricuspid. Aortic valve regurgitation is   trivial. Aortic regurgitation PHT measures 361 msec. No aortic stenosis is  present.   Pulmonic Valve: The pulmonic valve was normal in structure.  Pulmonic valve  regurgitation is trivial.   Aorta: Aortic dilatation noted. There is severe dilatation of the  ascending aorta, measuring 50 mm.   Venous: The inferior vena cava is normal in size with less than 50%  respiratory variability, suggesting right atrial pressure of 8 mmHg.   IAS/Shunts: No atrial level shunt detected by color flow Doppler.      LEFT VENTRICLE  PLAX 2D  LVIDd:         6.60 cm  Diastology  LVIDs:         5.20 cm  LV e' medial:    5.44 cm/s  LV PW:         1.60 cm  LV E/e' medial:  18.1  LV IVS:        2.20 cm  LV e' lateral:   4.46 cm/s  LVOT diam:     2.50 cm  LV E/e' lateral: 22.1  LV SV:         64  LV SV Index:   30  LVOT Area:     4.91 cm      RIGHT VENTRICLE            IVC  RV Basal diam:  2.90 cm    IVC diam: 2.40 cm  RV S prime:     9.90 cm/s  TAPSE (M-mode): 1.3 cm   LEFT ATRIUM              Index       RIGHT ATRIUM           Index  LA diam:        5.70 cm  2.63 cm/m  RA Area:     29.60 cm  LA Vol (A2C):   149.0 ml 68.82 ml/m RA Volume:   112.00 ml 51.73 ml/m  LA Vol (A4C):   104.0 ml 48.04 ml/m  LA Biplane Vol: 124.0 ml 57.27 ml/m   AORTIC VALVE  LVOT Vmax:   98.30 cm/s  LVOT Vmean:  59.800 cm/s  LVOT VTI:    0.131 m  AI PHT:      361 msec     AORTA  Ao Root diam: 5.00 cm  Ao Asc diam:  4.60 cm   MITRAL VALVE  MV Area (PHT): 4.31 cm    SHUNTS  MV Decel Time: 176 msec    Systemic VTI:  0.13 m  MV E velocity: 98.50 cm/s  Systemic Diam: 2.50 cm  MV A velocity: 89.70 cm/s  MV E/A ratio:  1.10   Loralie Champagne MD  Electronically signed by Loralie Champagne MD  Signature Date/Time: 05/23/2020/2:16:56 PM         Final     Impression:  45 year old man with incidentally discovered ascending aortic aneurysm although there is a history of cardiac disease.  His ascending aortic  aneurysm is stable at 4.7 cm.  I have recommended follow-up with his cardiologist for his low EF.  It does seem like he is managed well medically.  Hypertension-well-controlled on his current medication regimen.    History of COVID-19 infection-symptoms have completely resolved.   Plan:  Follow-up in 6 months for repeat assessment of the ascending aortic aneurysm with a CT scan of the chest without contrast.   I spent in excess of 30 minutes during the conduct of this office consultation and >50% of this time involved direct face-to-face encounter with the patient for counseling and/or coordination of their care.  Level 3 Office Consult = 40 minutes         Level 4 Office Consult = 60 minutes         Level 5 Office Consult = 80 minutes  Nicholes Rough, [place] 12/21/2020

## 2021-01-24 ENCOUNTER — Other Ambulatory Visit: Payer: Self-pay | Admitting: Family Medicine

## 2021-02-16 ENCOUNTER — Other Ambulatory Visit: Payer: Self-pay

## 2021-02-16 MED ORDER — SPIRONOLACTONE 25 MG PO TABS: 25.0000 mg | ORAL_TABLET | Freq: Every day | ORAL | 3 refills | Status: DC

## 2021-03-20 ENCOUNTER — Other Ambulatory Visit: Payer: Self-pay

## 2021-03-20 ENCOUNTER — Ambulatory Visit (INDEPENDENT_AMBULATORY_CARE_PROVIDER_SITE_OTHER): Payer: 59

## 2021-03-20 ENCOUNTER — Other Ambulatory Visit: Payer: Self-pay | Admitting: Family Medicine

## 2021-03-20 DIAGNOSIS — I5042 Chronic combined systolic (congestive) and diastolic (congestive) heart failure: Secondary | ICD-10-CM | POA: Diagnosis not present

## 2021-03-20 DIAGNOSIS — E785 Hyperlipidemia, unspecified: Secondary | ICD-10-CM | POA: Diagnosis not present

## 2021-03-20 DIAGNOSIS — I10 Essential (primary) hypertension: Secondary | ICD-10-CM

## 2021-03-20 LAB — ECHOCARDIOGRAM COMPLETE
AR max vel: 3.1 cm2
AV Area VTI: 3.11 cm2
AV Area mean vel: 3.01 cm2
AV Mean grad: 6 mmHg
AV Peak grad: 11.8 mmHg
AV Vena cont: 0.39 cm
Ao pk vel: 1.72 m/s
Area-P 1/2: 3.12 cm2
Calc EF: 47.9 %
P 1/2 time: 1392 msec
S' Lateral: 3.46 cm
Single Plane A2C EF: 49.3 %
Single Plane A4C EF: 45.2 %

## 2021-03-20 MED ORDER — ACETAMINOPHEN 500 MG PO TABS: 1000.0000 mg | ORAL_TABLET | Freq: Four times a day (QID) | ORAL | 0 refills | Status: DC | PRN

## 2021-03-20 MED ORDER — ASPIRIN EC 81 MG PO TBEC: 81.0000 mg | DELAYED_RELEASE_TABLET | Freq: Every evening | ORAL | 3 refills | Status: AC

## 2021-04-12 ENCOUNTER — Ambulatory Visit (HOSPITAL_COMMUNITY)
Admission: EM | Admit: 2021-04-12 | Discharge: 2021-04-12 | Disposition: A | Discharge status: Home / Self Care | Payer: 59 | Attending: Physician Assistant | Admitting: Physician Assistant

## 2021-04-12 ENCOUNTER — Other Ambulatory Visit: Payer: Self-pay

## 2021-04-12 ENCOUNTER — Encounter (HOSPITAL_COMMUNITY): Payer: Self-pay | Admitting: Emergency Medicine

## 2021-04-12 ENCOUNTER — Ambulatory Visit (INDEPENDENT_AMBULATORY_CARE_PROVIDER_SITE_OTHER): Payer: 59

## 2021-04-12 DIAGNOSIS — J069 Acute upper respiratory infection, unspecified: Secondary | ICD-10-CM | POA: Insufficient documentation

## 2021-04-12 DIAGNOSIS — Z28310 Unvaccinated for covid-19: Secondary | ICD-10-CM | POA: Insufficient documentation

## 2021-04-12 DIAGNOSIS — Z20822 Contact with and (suspected) exposure to covid-19: Secondary | ICD-10-CM | POA: Insufficient documentation

## 2021-04-12 DIAGNOSIS — Z720 Tobacco use: Secondary | ICD-10-CM | POA: Diagnosis not present

## 2021-04-12 DIAGNOSIS — Z7982 Long term (current) use of aspirin: Secondary | ICD-10-CM | POA: Insufficient documentation

## 2021-04-12 DIAGNOSIS — Z79899 Other long term (current) drug therapy: Secondary | ICD-10-CM | POA: Insufficient documentation

## 2021-04-12 DIAGNOSIS — R051 Acute cough: Secondary | ICD-10-CM | POA: Diagnosis present

## 2021-04-12 DIAGNOSIS — Z8679 Personal history of other diseases of the circulatory system: Secondary | ICD-10-CM | POA: Insufficient documentation

## 2021-04-12 DIAGNOSIS — R059 Cough, unspecified: Secondary | ICD-10-CM | POA: Diagnosis not present

## 2021-04-12 LAB — POC INFLUENZA A AND B ANTIGEN (URGENT CARE ONLY)
INFLUENZA A ANTIGEN, POC: NEGATIVE
INFLUENZA B ANTIGEN, POC: NEGATIVE

## 2021-04-12 MED ORDER — BENZONATATE 100 MG PO CAPS: 100.0000 mg | ORAL_CAPSULE | Freq: Three times a day (TID) | ORAL | 0 refills | Status: DC

## 2021-04-12 NOTE — Discharge Instructions (Signed)
Your flu test was negative.  Your COVID test is pending.  We will contact you if this is positive.  Please use Mucinex and Flonase for cough and congestion.  Use Tessalon for cough.  If you are positive for COVID and interested in starting Paxlovid as we discussed please let our nurse know when she calls you.  If you have any worsening symptoms including chest pain, shortness of breath, nausea/vomiting, worsening cough, weakness you need to go to the emergency room.  If symptoms have not improved by next week please return here or see your PCP.

## 2021-04-12 NOTE — Progress Notes (Deleted)
Cardiology Office Note:    Date:  04/12/2021   ID:  Thomas Spears, DOB 05/22/1975, MRN [id number]  PCP:  Thomas Haw, MD   [facility] Providers Cardiologist:  Thomas Bergeron, MD {    Referring MD: Thomas Haw, MD    History of Present Illness:    Thomas Spears is a 46 y.o. 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  The patient was hospitalized at [facility] from 05/22/20-05/23/20 with hypertensive crisis where he presented with dyspnea and HA found to have Bps 220s/150s. CTA chest showed no PE but demonstrated thoracic aortic aneurysm 4.7cm as well as evidence of pulmonary HTN. He was placed on a nitro gtt. Trop 400>433. TTE 05/23/20 showed LVEF 25% with global hypokinesis, severe LVH, G2DD, mild RV dysfunction, severe LAE, moderate RAE, mild-to-moderate MR, trivial AI, severe aortic dilation, small pericardial effusion. Prior to completed work-up, the patient left AMA.   He was seen in clinic 07/23/2018 where he reported several years of not taking his medication and uncontrolled hypertension. Also regularly drinking energy drinks, soda, smoking 2 packs/day.  He used to be a heavy drinker.  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  He was taking all of his blood pressure medications and feeling overall well.  He displayed NYHA I-II symptoms.  He was recommended for stress MRI to assess for ischemia and evidence of amyloidosis.  His losartan was transitioned to [facility], metoprolol transition to carvedilol.  He was referred to the hypertension clinic.   Seen by pharmacy team 08/09/20 and Carvedilol increased to 12.5mg  QD.    He had cardiac stress MRI 08/19/20 showing basal to mid inferior stress perfusion defect consistent with ischemia.  He had moderate LV dilation with moderate systolic dysfunction LVEF 34%.   Asymmetric LV hypertrophy measuring up to 39 mL basal septum differential including hypertrophic cardiomyopathy versus hypertensive heart disease.  No evidence of cardiac amyloidosis.  Normal RV size with mild systolic dysfunction, moderate aortic regurgitation, mild to moderate mitral regurgitation, ascending aortic aneurysm measuring 45 mm.Cardiac catheterization performed 7/07-2020 by Dr. Ellyn Hack showing diffuse mild to moderate multivessel disease, no culprit lesion to explain reduced LVEF, no previously placed stent.   Seen by pharmacy team 09/06/20 and transitioned from indapamide to spironolactone. Seen 09/14/20 with [name] increased to 49-51mg  BID. 09/28/20 pharmacy increased Carvedilol to 25mg  BID and Spironolactone to 25mg  QD, Entresto increased to 97/103mg .    Last seen by pharmacy team 12/02/20. He was continuing to decrease smoking. He had been approved for [facility] as well as [place] patient assistance. His Carvedilol was increased to 37.5mg  QD as diastolic blood pressure still above goal. Transition from spironolactone to eplenerone was considered but deferred as he is without insurance.   Last saw 36.$[id number]$ 11/2020 where he was doing well. BP 130-140s at that time.   Has a strong family history of CAD: his father had CABGx2 in his 6's-50's. Hypertension is also present on both maternal and paternal sides of his family. He denies any history of aneurysms or kidney disease.  Past Medical History:  Diagnosis Date   Aortic regurgitation    07/2020 cardiac MRI moderate aortic regurgitation   Ascending aorta dilation (HCC)    05/01/20 CTA 4.7 cm. 08/19/20 cardiac stress MRI 82mm.   Chronic combined systolic and diastolic CHF (congestive heart failure) ([place])    Community  acquired pneumonia    HLD (hyperlipidemia)    Hypertension    Hypertensive crisis 05/22/2020   Left ventricular hypertrophy    Mitral regurgitation    07/2020 cardiac MRI mild ot moderate MR   Tension headache  06/21/2020   Tobacco use     Past Surgical History:  Procedure Laterality Date   CAROTID STENT INSERTION     LEFT HEART CATH AND CORONARY ANGIOGRAPHY N/A 08/31/2020   Procedure: LEFT HEART CATH AND CORONARY ANGIOGRAPHY;  Surgeon: Leonie Man, MD;  Location: [place] [facility];  Service: Cardiovascular;  Laterality: N/A;    Current Medications: No outpatient medications have been marked as taking for the 04/14/21 encounter (Appointment) with Thomas Bergeron, MD.     Allergies:   Mushroom extract complex   Social History   Socioeconomic History   Marital status: Single    Spouse name: Not on file   Number of children: Not on file   Years of education: Not on file   Highest education level: Not on file  Occupational History   Not on file  Tobacco Use   Smoking status: Light Smoker   Smokeless tobacco: Never  Substance and Sexual Activity   Alcohol use: Not Currently   Drug use: Not on file   Sexual activity: Yes  Other Topics Concern   Not on file  Social History Narrative   Not on file   Social Determinants of Health   Financial Resource Strain: Not on file  Food Insecurity: Not on file  Transportation Needs: Not on file  Physical Activity: Not on file  Stress: Not on file  Social Connections: Not on file     Family History: The patient's family history is not on file.  ROS:   Please see the history of present illness.    Review of Systems  Constitutional:  Negative for fever and weight loss.  HENT:  Negative for nosebleeds and sinus pain.   Eyes:  Negative for photophobia and pain.  Respiratory:  Negative for hemoptysis, wheezing and stridor.   Cardiovascular:  Positive for leg swelling. Negative for chest pain, palpitations, orthopnea, claudication and PND.  Gastrointestinal:  Negative for abdominal pain, constipation and vomiting.  Genitourinary:  Negative for frequency and urgency.  Musculoskeletal:  Negative for falls, myalgias and neck pain.   Neurological:  Positive for tingling. Negative for seizures and headaches.  Endo/Heme/Allergies:  Does not bruise/bleed easily.  Psychiatric/Behavioral:  The patient is not nervous/anxious and does not have insomnia.     EKGs/Labs/Other Studies Reviewed:    The following studies were reviewed today:  LHC 06/02/94 LV end diastolic pressure is moderately elevated. There is no aortic valve stenosis. Mid LM to Dist LM lesion is 20% stenosed. Ost LAD to Prox LAD lesion is 30% stenosed. Mid LAD lesion is 45% stenosed @ 1st Diag (40% pre & 50% post 1st Diag) Prox RCA lesion is 30% stenosed. Dist RCA lesion is 60% stenosed - discrete focal lesion, not flow limiting. No evidence of stent.   SUMMARY Diffuse mild to moderate multivessel disease but no obvious culprit lesion to explain significant reduced EF. Unable to visualize any previously placed stents. Moderately elevated LVEDP     RECOMMENDATIONS Continue to titrate GDMT for nonischemic CM. ->  Anticipate titrating up carvedilol to 12.5 mg twice daily and further titration of Entresto. May also consider spironolactone Cardiac MRI Stress 08/19/20 IMPRESSION: 1. Basal to mid inferior stress perfusion defect consistent with ischemia   2. Moderate  LV dilatation with moderate systolic dysfunction (EF 93%)   3. Asymmetric LV hypertrophy measuring up to 59mm in basal septum (34mm in posterior wall). Differential includes hypertrophic cardiomyopathy vs hypertensive heart disease given history of severe uncontrolled hypertension. No evidence of cardiac amyloidosis. Patchy midwall LGE, which can be seen in either HCM or hypertensive heart disease. Suspect component of hypertensive heart disease, but also with likely HCM given extent of hypertrophy and significant amount of LGE, accounting for 12% of total myocardial mass   4.  Normal RV size with mild systolic dysfunction (EF 79%)   5.  Moderate aortic regurgitation (regurgitant  fraction 27%)   6.  Mild to moderate mitral regurgitation (regurgitant fraction 21%)   7.  Ascending aortic aneurysm measuring 77mm   TTE 05/23/20: IMPRESSIONS   1. Left ventricular ejection fraction, by estimation, is 25%. The left  ventricle has severely decreased function. The left ventricle demonstrates  global hypokinesis. The left ventricular internal cavity size was mildly  dilated. There is severe left  ventricular hypertrophy raising concern cardiac amyloidosis versus  long-standing severe hypertension. Left ventricular diastolic parameters  are consistent with Grade II diastolic dysfunction (pseudonormalization).   2. Right ventricular systolic function is mildly reduced. The right  ventricular size is normal.   3. Left atrial size was severely dilated.   4. Right atrial size was moderately dilated.   5. The mitral valve is normal in structure. Mild to moderate mitral valve  regurgitation. No evidence of mitral stenosis.   6. The aortic valve is tricuspid. Aortic valve regurgitation is trivial.  No aortic stenosis is present.   7. Aortic dilatation noted. There is severe dilatation of the ascending  aorta, measuring 50 mm.   8. The inferior vena cava is normal in size with <50% respiratory  variability, suggesting right atrial pressure of 8 mmHg.   9. A small pericardial effusion is present.   CTA 05/22/20: IMPRESSION: 1. No evidence of acute pulmonary embolism. 2. Multifocal patchy airspace opacity within the right lung most pronounced within the right upper lobe. Findings are concerning for multifocal pneumonia. Follow-up to resolution is recommended. 3. 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 Recommend semi-annual imaging followup by CTA or MRA and referral to cardiothoracic surgery if not already obtained. This recommendation follows 2010 ACCF/AHA/AATS/ACR/ASA/SCA/SCAI/SIR/STS/SVM Guidelines for the Diagnosis and Management of  Patients With Thoracic Aortic Disease. Circulation. 2010; 121: K240-X735. Aortic aneurysm NOS (ICD10-I71.9) 4. Marked four-chamber cardiomegaly with marked left ventricular hypertrophy. 5. Dilated main pulmonary trunk measuring 4.1 cm in diameter, suggesting pulmonary arterial hypertension. 6. Mildly enlarged mediastinal and right hilar lymph nodes, likely reactive.   EKG:   07/22/2020: EKG is not ordered today.  Recent Labs: 05/22/2020: B Natriuretic Peptide 3,522.0 05/23/2020: TSH 0.400 08/26/2020: Hemoglobin 14.0; Platelets 270 12/16/2020: ALT 29; BUN 16; Creatinine, Ser 1.20; Potassium 4.6; Sodium 139  Recent Lipid Panel    Component Value Date/Time   CHOL 119 12/16/2020 0846   TRIG 154 (H) 12/16/2020 0846   HDL 28 (L) 12/16/2020 0846   CHOLHDL 4.3 12/16/2020 0846   CHOLHDL 5.5 05/23/2020 0239   VLDL 16 05/23/2020 0239   LDLCALC 64 12/16/2020 0846     Risk Assessment/Calculations:       Physical Exam:    VS:  There were no vitals taken for this visit.    Wt Readings from Last 3 Encounters:  12/21/20 229 lb (103.9 kg)  12/16/20 229 lb 12.8 oz (104.2 kg)  12/02/20 229  lb (103.9 kg)     GEN: Well nourished, well developed in no acute distress HEENT: Normal NECK: No JVD; No carotid bruits LYMPHATICS: No lymphadenopathy CARDIAC: RRR, no murmurs, rubs, gallops RESPIRATORY:  Clear to auscultation without rales, wheezing or rhonchi  ABDOMEN: Soft, non-tender, non-distended MUSCULOSKELETAL:  No edema; No deformity  SKIN: Warm and dry NEUROLOGIC:  Alert and oriented x 3 PSYCHIATRIC:  Normal affect   ASSESSMENT:    No diagnosis found.  PLAN:    In order of problems listed above:  #ChronicSystolic Heart Failure: TTE 04/2020 with LVEF 25%, severe LVH, G2DD, mild RV dysfunction, severe LAE, moderate RAE, mild-to-moderate MR, trivial AI, severe aortic dilation, small pericaridal effusion. Cardiac stress MRI 08/19/20 with basal to mid inferior stress perfusion defect  consistent with ischemia, EF 34%. No evidence of amyloidosis. LHC 08/31/2020 with mild to moderate nonobstructive coronary disease. Suspect HF secondary to HTN. Now on GDMT. Repeat TTE 02/2021 with improved EF 60-65%, G2DD, severe LVH, normal RV, mild MR. -LHC without significant obstructive disease -TTE 02/2021 with improved EF 25>>60-65% -Continue entresto 97/103mg  BID -Continue coreg 25mg  BID -Continue farxiga 10mg  daily -Continue spironolactone 25mg  daily -Continue hydralazine 25mg  TID -Low Na diet  #HTN with severe LVH: -Continue entresto 97/103mg  BID -Continue coreg 25mg  BID -Continue spironolactone 25mg  daily -Continue hydralazine 25mg  TID  #Severe ascending aortic dilation: 4.7 cm by CTA 05/22/2020.  Ascending aorta 45 mm by cardiac stress MRI.  -Follow with Dr. Orvan Seen of cardiothoracic surgery.  #Nonobstructive CAD: LHC 08/31/2020 with mild to moderate nonobstructive disease.  -Continue ASA 81mg  daily -Continue liptor 40mg  daily -Continue coreg 25mg  BID  #HLD -Continue liptor 40mg  daily  #Moderate AI: #Mild to moderate MR: Moderate AI on CMR and appeared mild on TTE 02/2021. Mild MR noted on TTE 02/2021. -Serial TTE monitoring; next 2025 unless clinical change  #Tobacco use: -Smoking cessation counseling   Medication Adjustments/Labs and Tests Ordered: Current medicines are reviewed at length with the patient today.  Concerns regarding medicines are outlined above.  No orders of the defined types were placed in this encounter.  No orders of the defined types were placed in this encounter.   There are no Patient Instructions on file for this visit.    I,Mathew Stumpf,acting as a Education administrator for Thomas Bergeron, MD.,have documented all relevant documentation on the behalf of Thomas Bergeron, MD,as directed by  Thomas Bergeron, MD while in the presence of Thomas Bergeron, MD.  I, Thomas Bergeron, MD, have reviewed all documentation for this visit. The  documentation on 04/12/21 for the exam, diagnosis, procedures, and orders are all accurate and complete.  Signed, Thomas Bergeron, MD  04/12/2021 6:36 AM    [place]

## 2021-04-12 NOTE — ED Provider Notes (Signed)
[place]    CSN: [id number] Arrival date & time: 04/12/21  1214      History   Chief Complaint Chief Complaint  Patient presents with   Cough   Nasal Congestion   Ear Fullness    HPI Thomas Spears is a 46 y.o. male.   Patient presents today with a 3 to 4-day history of URI symptoms.  Reports cough, congestion, ear fullness, body aches, fatigue, chest tightness.  Denies any chest pain, shortness of breath, dizziness, syncope, nausea, vomiting.  He does report significant other is sick with similar symptoms.  He has not had influenza or COVID-19 vaccines.  He has not had COVID in the past.  He is a current everyday smoker smoking approximately 0.5 packs/day.  Denies history of asthma, allergies, COPD.  He does have history of community-acquired pneumonia in the past and states current symptoms are similar to previous episodes of this condition.  He has been taking cough medicine without improvement of symptoms.  Denies any recent antibiotic use.   Past Medical History:  Diagnosis Date   Aortic regurgitation    07/2020 cardiac MRI moderate aortic regurgitation   Ascending aorta dilation (HCC)    05/01/20 CTA 4.7 cm. 08/19/20 cardiac stress MRI 35mm.   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   ED (erectile dysfunction) 06/08/2020   Severe uncontrolled hypertension 05/31/2020   Thoracic aortic aneurysm 05/27/2020   HFrEF (heart failure with reduced ejection fraction) ([place])    COVID-19     Past Surgical History:  Procedure  Laterality Date   CAROTID STENT INSERTION     KNEE SURGERY Left    LEFT HEART CATH AND CORONARY ANGIOGRAPHY N/A 08/31/2020   Procedure: LEFT HEART CATH AND CORONARY ANGIOGRAPHY;  Surgeon: Leonie Man, MD;  Location: [place] [facility];  Service: Cardiovascular;  Laterality: N/A;   TONSILLECTOMY         Home Medications    Prior to Admission medications   Medication Sig Start Date End Date Taking? Authorizing Provider  benzonatate (TESSALON) 100 MG capsule Take 1 capsule (100 mg total) by mouth every 8 (eight) hours. 04/12/21  Yes Shabrea Weldin, Derry Skill, PA-C  acetaminophen (TYLENOL) 500 MG tablet Take 2 tablets (1,000 mg total) by mouth every 6 (six) hours as needed (pain). 03/20/21   Lattie Haw, MD  aspirin EC 81 MG tablet Take 1 tablet (81 mg total) by mouth every evening. Swallow whole. 03/20/21   Lattie Haw, MD  atorvastatin (LIPITOR) 40 MG tablet TAKE 1 TABLET(40 MG) BY MOUTH DAILY 01/24/21   Simmons-Robinson, Riki Sheer, MD  carvedilol (COREG) 25 MG tablet Take 1.5 tablets (37.5 mg total) by mouth 2 (two) times daily. 12/05/20   Freada Bergeron, MD  dapagliflozin propanediol (FARXIGA) 10 MG TABS tablet Take 10 mg by mouth daily.    [provider]  hydrALAZINE (APRESOLINE) 25 MG tablet Take 1 tablet (25 mg total) by mouth 2 (two) times daily. 12/16/20   Loel Dubonnet, NP  sacubitril-valsartan [name])  97-103 MG Take 1 tablet by mouth 2 (two) times daily. 12/02/20   Freada Bergeron, MD  spironolactone (ALDACTONE) 25 MG tablet Take 1 tablet (25 mg total) by mouth daily. 02/16/21   Freada Bergeron, MD    Family History History reviewed. No pertinent family history.  Social History Social History   Tobacco Use   Smoking status: Light Smoker   Smokeless tobacco: Never  Substance Use Topics   Alcohol use: Not Currently     Allergies   Mushroom extract complex   Review of Systems Review of Systems  Constitutional:  Positive for activity change  and fatigue. Negative for appetite change and fever.  HENT:  Positive for congestion and sore throat. Negative for sinus pressure and sneezing.   Respiratory:  Positive for cough and chest tightness. Negative for shortness of breath.   Cardiovascular:  Negative for chest pain.  Gastrointestinal:  Positive for diarrhea. Negative for abdominal pain, nausea and vomiting.  Musculoskeletal:  Positive for arthralgias and myalgias.  Neurological:  Positive for headaches. Negative for dizziness and light-headedness.    Physical Exam Triage Vital Signs ED Triage Vitals  Enc Vitals Group     BP 04/12/21 1247 136/90     Pulse Rate 04/12/21 1247 70     Resp 04/12/21 1247 17     Temp 04/12/21 1247 98.6 F (37 C)     Temp Source 04/12/21 1247 Oral     SpO2 04/12/21 1247 98 %     Weight --      Height --      Head Circumference --      Peak Flow --      Pain Score 04/12/21 1246 7     Pain Loc --      Pain Edu? --      Excl. in [place]? --    No data found.  Updated Vital Signs BP 136/90 (BP Location: Left Arm)    Pulse 70    Temp 98.6 F (37 C) (Oral)    Resp 17    SpO2 98%   Visual Acuity Right Eye Distance:   Left Eye Distance:   Bilateral Distance:    Right Eye Near:   Left Eye Near:    Bilateral Near:     Physical Exam Vitals reviewed.  Constitutional:      General: He is awake.     Appearance: Normal appearance. He is well-developed. He is not ill-appearing.     Comments: Very pleasant male appears stated age in no acute distress sitting comfortably on exam room table  HENT:     Head: Normocephalic and atraumatic.     Right Ear: Tympanic membrane, ear canal and external ear normal. Tympanic membrane is not erythematous or bulging.     Left Ear: Tympanic membrane, ear canal and external ear normal. Tympanic membrane is not erythematous or bulging.     Ears:     Comments: Cerumen noted in bilateral ear canals; able to visualize approximately 70% of TM that appears normal.     Nose: Nose normal.     Mouth/Throat:     Pharynx: Uvula midline. Posterior oropharyngeal erythema present. No oropharyngeal exudate or uvula swelling.  Cardiovascular:     Rate and Rhythm: Normal rate and regular rhythm.     Heart sounds: Normal heart sounds, S1 normal and S2 normal. No murmur heard. Pulmonary:     Effort: Pulmonary effort is normal. No accessory muscle usage or respiratory distress.  Breath sounds: No stridor. Decreased breath sounds present. No wheezing, rhonchi or rales.  Abdominal:     General: Bowel sounds are normal.     Palpations: Abdomen is soft.     Tenderness: There is no abdominal tenderness.  Neurological:     Mental Status: He is alert.  Psychiatric:        Behavior: Behavior is cooperative.     UC Treatments / Results  Labs (all labs ordered are listed, but only abnormal results are displayed) Labs Reviewed  SARS CORONAVIRUS 2 (TAT 6-24 HRS)  POC INFLUENZA A AND B ANTIGEN (URGENT CARE ONLY)    EKG   Radiology DG Chest 2 View  Result Date: 04/12/2021 CLINICAL DATA:  Cough for 4 days.  Congestion.  Body aches. EXAM: CHEST - 2 VIEW COMPARISON:  12/21/2020 CT.  Plain film 05/22/2020 FINDINGS: Lateral view degraded by patient arm position. Midline trachea. Moderate cardiomegaly. Mediastinal contours otherwise within normal limits. No pleural effusion or pneumothorax. No congestive failure. Clear lungs. IMPRESSION: Cardiomegaly without congestive failure. Electronically Signed   By: Abigail Miyamoto M.D.   On: 04/12/2021 13:16    Procedures Procedures (including critical care time)  Medications Ordered in UC Medications - No data to display  Initial Impression / Assessment and Plan / UC Course  I have reviewed the triage vital signs and the nursing notes.  Pertinent labs & imaging results that were available during my care of the patient were reviewed by me and considered in my medical decision making (see chart for details).     Discussed  likely viral etiology given clinical presentation.  No evidence of acute infection on physical exam that would warrant initiation of antibiotics.  X-ray was obtained given decreased lung sounds showed stable cardiomegaly without acute cardiopulmonary disease.  Flu testing was negative.  COVID test is pending.  Discussed that if he is positive for COVID-19 he would benefit from antiviral medication given history of heart disease and cigarette smoking.  He is interested in this and is a candidate for Paxlovid.  He would need to hold his atorvastatin while on the medication and for 3 days after completing course.  No indication for dose adjustment based on CMP from 12/16/2020 with creatinine of 1.20 and calculated creatinine clearance of 114.21 mL/min.  Recommended over-the-counter medication including Mucinex and Flonase.  He can use Tessalon for cough.  He is to rest and drink plenty of fluid.  Discussed that if symptoms or not improving by next week he should return for reevaluation.  If anything worsens and he develops high fever, chest pain, shortness of breath, nausea/vomiting interfering with oral intake he needs to go to the emergency room.  Strict return precautions given to which he expressed understanding.  Work excuse note provided.  Final Clinical Impressions(s) / UC Diagnoses   Final diagnoses:  Viral URI with cough  Acute cough     Discharge Instructions      Your flu test was negative.  Your COVID test is pending.  We will contact you if this is positive.  Please use Mucinex and Flonase for cough and congestion.  Use Tessalon for cough.  If you are positive for COVID and interested in starting Paxlovid as we discussed please let our nurse know when she calls you.  If you have any worsening symptoms including chest pain, shortness of breath, nausea/vomiting, worsening cough, weakness you need to go to the emergency room.  If symptoms have not improved by next week please  return here or see  your PCP.     ED Prescriptions     Medication Sig Dispense Auth. Provider   benzonatate (TESSALON) 100 MG capsule Take 1 capsule (100 mg total) by mouth every 8 (eight) hours. 21 capsule Mikaylee Arseneau K, PA-C      PDMP not reviewed this encounter.   Terrilee Croak, PA-C 04/12/21 1345

## 2021-04-12 NOTE — ED Triage Notes (Signed)
Pt reports for about 4 days having cough, congestion, body aches, and ear fullness.

## 2021-04-13 LAB — SARS CORONAVIRUS 2 (TAT 6-24 HRS): SARS Coronavirus 2: NEGATIVE

## 2021-04-14 ENCOUNTER — Encounter: Payer: Self-pay | Admitting: Cardiology

## 2021-04-14 ENCOUNTER — Ambulatory Visit: Payer: 59 | Admitting: Cardiology

## 2021-04-14 ENCOUNTER — Other Ambulatory Visit: Payer: Self-pay

## 2021-04-14 VITALS — BP 136/80 | HR 68 | Ht 69.0 in | Wt 226.6 lb

## 2021-04-14 DIAGNOSIS — Z720 Tobacco use: Secondary | ICD-10-CM

## 2021-04-14 DIAGNOSIS — I10 Essential (primary) hypertension: Secondary | ICD-10-CM

## 2021-04-14 DIAGNOSIS — I493 Ventricular premature depolarization: Secondary | ICD-10-CM

## 2021-04-14 DIAGNOSIS — I5042 Chronic combined systolic (congestive) and diastolic (congestive) heart failure: Secondary | ICD-10-CM | POA: Diagnosis not present

## 2021-04-14 DIAGNOSIS — I77810 Thoracic aortic ectasia: Secondary | ICD-10-CM | POA: Diagnosis not present

## 2021-04-14 DIAGNOSIS — I25118 Atherosclerotic heart disease of native coronary artery with other forms of angina pectoris: Secondary | ICD-10-CM

## 2021-04-14 DIAGNOSIS — E785 Hyperlipidemia, unspecified: Secondary | ICD-10-CM

## 2021-04-14 MED ORDER — ISOSORBIDE MONONITRATE ER 30 MG PO TB24: 15.0000 mg | ORAL_TABLET | Freq: Every day | ORAL | 3 refills | Status: DC

## 2021-04-14 MED ORDER — HYDRALAZINE HCL 25 MG PO TABS: 25.0000 mg | ORAL_TABLET | Freq: Three times a day (TID) | ORAL | 2 refills | Status: DC

## 2021-04-14 NOTE — Patient Instructions (Addendum)
Medication Instructions:   INCREASE HYDRALAZINE TO 25 MG BY MOUTH THREE TIMES DAILY  START TAKING ISOSORBIDE MONONITRATE (IMDUR) 15 MG BY MOUTH DAILY AT BEDTIME.   *If you need a refill on your cardiac medications before your next appointment, please call your pharmacy*    Follow-Up: At [facility], you and your health needs are our priority.  As part of our continuing mission to provide you with exceptional heart care, we have created designated Provider Care Teams.  These Care Teams include your primary Cardiologist (physician) and Advanced Practice Providers (APPs -  Physician Assistants and Nurse Practitioners) who all work together to provide you with the care you need, when you need it.  We recommend signing up for the patient portal called "MyChart".  Sign up information is provided on this After Visit Summary.  MyChart is used to connect with patients for Virtual Visits (Telemedicine).  Patients are able to view lab/test results, encounter notes, upcoming appointments, etc.  Non-urgent messages can be sent to your provider as well.   To learn more about what you can do with MyChart, go to [email or web address].    Your next appointment:   6 month(s)  The format for your next appointment:   In Person  Provider:   Freada Bergeron, MD {

## 2021-04-14 NOTE — Progress Notes (Addendum)
Cardiology Office Note:    Date:  04/14/2021   ID:  Thomas Spears, DOB 09/23/1975, MRN [id number]  PCP:  Thomas Haw, MD   [facility] Providers Cardiologist:  Thomas Bergeron, MD {    Referring MD: Thomas Haw, MD    History of Present Illness:    Thomas Spears is a 46 y.o. male with a hx of newly diagnosed systolic heart failure with LVEF 25% with global hypokinesis, HTN, mild-to-moderate MR, severe aortic dilation measuring 39mm, and LVH who presents to clinic for follow-up.  The patient was hospitalized at [facility] from 05/22/20-05/23/20 with hypertensive crisis where he presented with dyspnea and HA found to have Bps 220s/150s. CTA chest showed no PE but demonstrated thoracic aortic aneurysm 4.7cm as well as evidence of pulmonary HTN. He was placed on a nitro gtt. Trop 400>433. TTE 05/23/20 showed LVEF 25% with global hypokinesis, severe LVH, G2DD, mild RV dysfunction, severe LAE, moderate RAE, mild-to-moderate MR, trivial AI, severe aortic dilation, small pericardial effusion. Prior to completed work-up, the patient left AMA.   He was seen in clinic 07/23/2018 where he reported several years of not taking his medication and uncontrolled hypertension.  Also regularly drinking energy drinks, soda, smoking 2 packs/day.  He used to be a heavy drinker.  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  He was taking all of his blood pressure medications and feeling overall well.  He displayed NYHA I-II symptoms.  He was recommended for stress MRI to assess for ischemia and evidence of amyloidosis.  His losartan was transitioned to [facility], metoprolol transition to carvedilol.  He was referred to the hypertension clinic.   Seen by pharmacy team 08/09/20 and Carvedilol increased to 12.5mg  QD.    He had cardiac stress MRI 08/19/20 showing basal to mid inferior stress perfusion defect consistent with ischemia.  He had moderate LV dilation with moderate systolic dysfunction LVEF 34%.   Asymmetric LV hypertrophy measuring up to 33 mm basal septum differential including hypertrophic cardiomyopathy versus hypertensive heart disease.  No evidence of cardiac amyloidosis.  Normal RV size with mild systolic dysfunction, moderate aortic regurgitation, mild to moderate mitral regurgitation, ascending aortic aneurysm measuring 45 mm. 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 Cardiac catheterization performed 7/07-2020 by Dr. Ellyn Hack showing 60% distal RCA but no culprit lesion to explain reduced LVEF, no previously placed stent.   Seen by pharmacy team 09/06/20 and transitioned from indapamide to spironolactone. Seen 09/14/20 with [name] increased to 49-51mg  BID. 09/28/20 pharmacy increased Carvedilol to 25mg  BID and Spironolactone to 25mg  QD, Entresto increased to 97/103mg .    Last seen by pharmacy team 12/02/20. He was continuing to decrease smoking. He had been approved for [facility] as well as [place] patient assistance. His Carvedilol was increased to 37.5mg  QD as diastolic blood pressure still above goal. Transition from spironolactone to eplenerone was considered but deferred as he is without insurance.   Last saw 40.$[id number]$ 11/2020 where he was doing well. BP 130-140s at that time. Echo on 03/20/21 showed improved LVEF to 60-65%.  Has a strong family history of CAD: his father had CABGx2 in his 7's-50's. Hypertension is also present on both maternal and paternal sides of his family. He denies any history of aneurysms or kidney disease.  Today, the patient states that he has been experiencing a little bit of chest tightness. Most recently occurred this morning when he woke up, but usually occurs when he is lying flat in bed at  night. Episodes seem to last 5-10 minutes. He notes this chest discomfort seems to have started after beginning benzonatate for his intermittent cough. He endorses some acid reflux which is notably different, controlled with Tums. He denies associated  shortness of breath.   Aside from his chest tightness, he is otherwise feeling great.  Currently he is taking hydralazine twice daily, at 9 AM and at 6:30-7:00 PM.  He has not monitored his BP at home recently as his cuff broke. He will replace it soon.  At this time he is down to smoking 1 cigarette every now and then. Usually he smokes when he becomes significantly nervous.  He denies any palpitations, or peripheral edema. No lightheadedness, syncope, orthopnea, or PND.   Past Medical History:  Diagnosis Date   Aortic regurgitation    07/2020 cardiac MRI moderate aortic regurgitation   Ascending aorta dilation (HCC)    05/01/20 CTA 4.7 cm. 08/19/20 cardiac stress MRI 76mm.   Chronic combined systolic and diastolic CHF (congestive heart failure) ([place])    Community acquired pneumonia    HLD (hyperlipidemia)    Hypertension    Hypertensive crisis 05/22/2020   Left ventricular hypertrophy    Mitral regurgitation    07/2020 cardiac MRI mild ot moderate MR   Tension headache 06/21/2020   Tobacco use     Past Surgical History:  Procedure Laterality Date   CAROTID STENT INSERTION     KNEE SURGERY Left    LEFT HEART CATH AND CORONARY ANGIOGRAPHY N/A 08/31/2020   Procedure: LEFT HEART CATH AND CORONARY ANGIOGRAPHY;  Surgeon: Thomas Man, MD;  Location: [facility];  Service: Cardiovascular;  Laterality: N/A;   TONSILLECTOMY      Current Medications: Current Meds  Medication Sig   acetaminophen (TYLENOL) 500 MG tablet Take 2 tablets (1,000 mg total) by mouth every 6 (six) hours as needed (pain).   aspirin EC 81 MG tablet Take 1 tablet (81 mg total) by mouth every evening. Swallow whole.   atorvastatin (LIPITOR) 40 MG tablet TAKE 1 TABLET(40 MG) BY MOUTH DAILY   benzonatate (TESSALON) 100 MG capsule Take 1 capsule (100 mg total) by mouth every 8 (eight) hours.   carvedilol (COREG) 25 MG tablet Take 1.5 tablets (37.5 mg total) by mouth 2 (two) times daily.   dapagliflozin  propanediol (FARXIGA) 10 MG TABS tablet Take 10 mg by mouth daily.   hydrALAZINE (APRESOLINE) 25 MG tablet Take 1 tablet (25 mg total) by mouth 3 (three) times daily.   isosorbide mononitrate (IMDUR) 30 MG 24 hr tablet Take 0.5 tablets (15 mg total) by mouth daily.   sacubitril-valsartan (ENTRESTO) 97-103 MG Take 1 tablet by mouth 2 (two) times daily.   spironolactone (ALDACTONE) 25 MG tablet Take 1 tablet (25 mg total) by mouth daily.   [DISCONTINUED] hydrALAZINE (APRESOLINE) 25 MG tablet Take 1 tablet (25 mg total) by mouth 2 (two) times daily.     Allergies:   Mushroom extract complex   Social History   Socioeconomic History   Marital status: Single    Spouse name: Not on file   Number of children: Not on file   Years of education: Not on file   Highest education level: Not on file  Occupational History   Not on file  Tobacco Use   Smoking status: Light Smoker   Smokeless tobacco: Never  Substance and Sexual Activity   Alcohol use: Not Currently   Drug use: Not on file   Sexual activity: Yes  Other Topics Concern   Not on file  Social History Narrative   Not on file   Social Determinants of Health   Financial Resource Strain: Not on file  Food Insecurity: Not on file  Transportation Needs: Not on file  Physical Activity: Not on file  Stress: Not on file  Social Connections: Not on file     Family History: The patient's family history is not on file.  ROS:   Please see the history of present illness.    Review of Systems  Constitutional:  Negative for fever and weight loss.  HENT:  Negative for nosebleeds and sinus pain.   Eyes:  Negative for photophobia and pain.  Respiratory:  Positive for cough. Negative for hemoptysis, wheezing and stridor.   Cardiovascular:  Positive for chest pain. Negative for palpitations, orthopnea, claudication, leg swelling and PND.  Gastrointestinal:  Negative for abdominal pain, constipation and vomiting.  Genitourinary:  Negative  for frequency and urgency.  Musculoskeletal:  Negative for falls, myalgias and neck pain.  Neurological:  Negative for tingling and seizures.  Endo/Heme/Allergies:  Does not bruise/bleed easily.  Psychiatric/Behavioral:  The patient is not nervous/anxious and does not have insomnia.     EKGs/Labs/Other Studies Reviewed:    The following studies were reviewed today:  Echo 03/20/2021:  1. No obstructive gradient. Left ventricular ejection fraction, by  estimation, is 60 to 65%. The left ventricle has normal function. The left  ventricle has no regional wall motion abnormalities. There is severe  concentric left ventricular hypertrophy.  Left ventricular diastolic parameters are consistent with Grade II  diastolic dysfunction (pseudonormalization).   2. Right ventricular systolic function is normal. The right ventricular  size is normal.   3. Left atrial size was moderately dilated.   4. The mitral valve is normal in structure. Mild mitral valve  regurgitation. No evidence of mitral stenosis.   5. The aortic valve is normal in structure. Aortic valve regurgitation is  mild. No aortic stenosis is present.   6. Aortic dilatation noted. There is moderate dilatation of the aortic  root, measuring 48 mm. There is moderate dilatation of the ascending  aorta, measuring 45 mm.   7. The inferior vena cava is normal in size with greater than 50%  respiratory variability, suggesting right atrial pressure of 3 mmHg.   Comparison(s): The left ventricular function has improved. EF 25%, severe  LVH, AOV 34mm, small pericardial effusion.   Conclusion(s)/Recommendation(s): Findings consistent with hypertrophic  cardiomyopathy. Prior Cardiac MRI has been performed.   CT Chest 12/21/2020: COMPARISON:  CT angiography of the chest of March of 2022.   FINDINGS: Cardiovascular: Ascending aorta of 4.7 cm in transverse dimension stable compared to previous imaging. Descending thoracic aorta 3.4 cm also  unchanged. Coronal dimension of the ascending thoracic aorta approximately 4.3 cm stable when measured in a similar fashion as well on the previous imaging study. The heart size remains enlarged. Coronary artery calcification of LEFT coronary circulation similar to the prior study. No substantial pericardial effusion. Normal caliber of central pulmonary vessels. Limited assessment of cardiovascular structures given lack of intravenous contrast.   Mediastinum/Nodes: No thoracic inlet, axillary, mediastinal or hilar adenopathy. Esophagus grossly normal.   Lungs/Pleura: Lungs are clear. Airways are patent. Mild paraseptal emphysema at the lung apices.   Upper Abdomen: No acute abnormality.   Musculoskeletal: No chest wall mass or suspicious bone lesions identified.   IMPRESSION: Stable aneurysmal dilation of the ascending thoracic aorta measuring up to 4.7 cm in  transverse dimension. 4.3 cm in greatest coronal dimension unchanged from previous imaging. Ascending thoracic aortic aneurysm. Recommend semi-annual imaging followup by CTA or MRA and referral to cardiothoracic surgery if not already obtained. This recommendation follows 2010 ACCF/AHA/AATS/ACR/ASA/SCA/SCAI/SIR/STS/SVM Guidelines for the Diagnosis and Management of Patients With Thoracic Aortic Disease. Circulation. 2010; 121: Z610-R604. Aortic aneurysm NOS (ICD10-I71.9)   Stable cardiomegaly and coronary artery calcification.   Mild paraseptal emphysema at the lung apices.   Aortic Atherosclerosis (ICD10-I70.0) and Emphysema (ICD10-J43.9).  Monitor 09/20/2020: Patch wear time was 8 days and 12 hours. Predominant rhythm was NSR with average HR 79bpm (ranging from 34-169bpm). There were 3 runs of nonsustained VT with longest/fastest lasting 8 beats at a rate of 169bpm Rare SVE (<1%), rare VE (<1%) Mobitz type I block seen during sleep Patient triggered events correlated with NSR No Afib, sustained arrhythmias or  significant pauses     Patch Wear Time:  8 days and 12 hours (2022-07-11T21:01:27-0400 to 2022-07-20T09:54:00-0400)   Patient had a min HR of 34 bpm, max HR of 169 bpm, and avg HR of 79 bpm. Predominant underlying rhythm was Sinus Rhythm. First Degree AV Block was present. 3 Ventricular Tachycardia runs occurred, the run with the fastest interval lasting 8 beats with a  max rate of 169 bpm (avg 130 bpm); the run with the fastest interval was also the longest. Second Degree AV Block-Mobitz I (Wenckebach) was present. Isolated SVEs were rare (<1.0%), and no SVE Couplets or SVE Triplets were present. Isolated VEs were rare  (<1.0%, 170), VE Couplets were rare (<1.0%, 12), and VE Triplets were rare (<1.0%, 6). Ventricular Trigeminy was present.   LHC 06/30/07 LV end diastolic pressure is moderately elevated. There is no aortic valve stenosis. Mid LM to Dist LM lesion is 20% stenosed. Ost LAD to Prox LAD lesion is 30% stenosed. Mid LAD lesion is 45% stenosed @ 1st Diag (40% pre & 50% post 1st Diag) Prox RCA lesion is 30% stenosed. Dist RCA lesion is 60% stenosed - discrete focal lesion, not flow limiting. No evidence of stent.   SUMMARY Diffuse mild to moderate multivessel disease but no obvious culprit lesion to explain significant reduced EF. Unable to visualize any previously placed stents. Moderately elevated LVEDP     RECOMMENDATIONS Continue to titrate GDMT for nonischemic CM. ->  Anticipate titrating up carvedilol to 12.5 mg twice daily and further titration of Entresto. May also consider spironolactone  Cardiac MRI Stress 08/19/20 IMPRESSION: 1. Basal to mid inferior stress perfusion defect consistent with ischemia   2. Moderate LV dilatation with moderate systolic dysfunction (EF 81%)   3. Asymmetric LV hypertrophy measuring up to 15mm in basal septum (64mm in posterior wall). Differential includes hypertrophic cardiomyopathy vs hypertensive heart disease given history of  severe uncontrolled hypertension. No evidence of cardiac amyloidosis. Patchy midwall LGE, which can be seen in either HCM or hypertensive heart disease. Suspect component of hypertensive heart disease, but also with likely HCM given extent of hypertrophy and significant amount of LGE, accounting for 12% of total myocardial mass   4.  Normal RV size with mild systolic dysfunction (EF 19%)   5.  Moderate aortic regurgitation (regurgitant fraction 27%)   6.  Mild to moderate mitral regurgitation (regurgitant fraction 21%)   7.  Ascending aortic aneurysm measuring 37mm   TTE 05/23/20: IMPRESSIONS   1. Left ventricular ejection fraction, by estimation, is 25%. The left  ventricle has severely decreased function. The left ventricle demonstrates  global hypokinesis. The left ventricular  internal cavity size was mildly  dilated. There is severe left  ventricular hypertrophy raising concern cardiac amyloidosis versus  long-standing severe hypertension. Left ventricular diastolic parameters  are consistent with Grade II diastolic dysfunction (pseudonormalization).   2. Right ventricular systolic function is mildly reduced. The right  ventricular size is normal.   3. Left atrial size was severely dilated.   4. Right atrial size was moderately dilated.   5. The mitral valve is normal in structure. Mild to moderate mitral valve  regurgitation. No evidence of mitral stenosis.   6. The aortic valve is tricuspid. Aortic valve regurgitation is trivial.  No aortic stenosis is present.   7. Aortic dilatation noted. There is severe dilatation of the ascending  aorta, measuring 50 mm.   8. The inferior vena cava is normal in size with <50% respiratory  variability, suggesting right atrial pressure of 8 mmHg.   9. A small pericardial effusion is present.   CTA 05/22/20: IMPRESSION: 1. No evidence of acute pulmonary embolism. 2. Multifocal patchy airspace opacity within the right lung  most pronounced within the right upper lobe. Findings are concerning for multifocal pneumonia. Follow-up to resolution is recommended. 3. 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 Recommend semi-annual imaging followup by CTA or MRA and referral to cardiothoracic surgery if not already obtained. This recommendation follows 2010 ACCF/AHA/AATS/ACR/ASA/SCA/SCAI/SIR/STS/SVM Guidelines for the Diagnosis and Management of Patients With Thoracic Aortic Disease. Circulation. 2010; 121: K998-P382. Aortic aneurysm NOS (ICD10-I71.9) 4. Marked four-chamber cardiomegaly with marked left ventricular hypertrophy. 5. Dilated main pulmonary trunk measuring 4.1 cm in diameter, suggesting pulmonary arterial hypertension. 6. Mildly enlarged mediastinal and right hilar lymph nodes, likely reactive.   EKG:   EKG is personally reviewed. 04/14/2021: EKG was not ordered. 08/19/2020 (Dr. Gardiner Rhyme): NSR, LVH with repolarization. 07/22/2020: EKG was not ordered.  Recent Labs: 05/22/2020: B Natriuretic Peptide 3,522.0 05/23/2020: TSH 0.400 08/26/2020: Hemoglobin 14.0; Platelets 270 12/16/2020: ALT 29; BUN 16; Creatinine, Ser 1.20; Potassium 4.6; Sodium 139   Recent Lipid Panel    Component Value Date/Time   CHOL 119 12/16/2020 0846   TRIG 154 (H) 12/16/2020 0846   HDL 28 (L) 12/16/2020 0846   CHOLHDL 4.3 12/16/2020 0846   CHOLHDL 5.5 05/23/2020 0239   VLDL 16 05/23/2020 0239   LDLCALC 64 12/16/2020 0846     Risk Assessment/Calculations:       Physical Exam:    VS:  BP 136/80   Pulse 68   Ht 5\' 9"  (1.753 m)   Wt 226 lb 9.6 oz (102.8 kg)   BMI 33.46 kg/m     Wt Readings from Last 3 Encounters:  04/14/21 226 lb 9.6 oz (102.8 kg)  12/21/20 229 lb (103.9 kg)  12/16/20 229 lb 12.8 oz (104.2 kg)     GEN: Well nourished, well developed in no acute distress HEENT: Normal NECK: No JVD; No carotid bruits CARDIAC: RRR, no murmurs, rubs, gallops RESPIRATORY:  Clear to  auscultation without rales, wheezing or rhonchi  ABDOMEN: Soft, non-tender, non-distended MUSCULOSKELETAL:  No edema; No deformity  SKIN: Warm and dry NEUROLOGIC:  Alert and oriented x 3 PSYCHIATRIC:  Normal affect   ASSESSMENT:    No diagnosis found.  PLAN:    In order of problems listed above:  #ChronicSystolic Heart Failure: TTE 04/2020 with LVEF 25%, severe LVH, G2DD, mild RV dysfunction, severe LAE, moderate RAE, mild-to-moderate MR, trivial AI, severe aortic dilation, small pericaridal effusion. Cardiac stress MRI 08/19/20 with basal to mid inferior  stress perfusion defect consistent with ischemia, EF 34%. No evidence of amyloidosis. LHC 08/31/2020 with mild to moderate nonobstructive coronary disease. Suspect HF secondary to HTN. Now on GDMT. Repeat TTE 02/2021 with improved EF 60-65%, G2DD, severe LVH, normal RV, mild MR. -LHC without significant obstructive disease -TTE 02/2021 with improved EF 25>>60-65% -Continue entresto 97/103mg  BID -Continue coreg 25mg  BID -Continue farxiga 10mg  daily -Continue spironolactone 25mg  daily -Increase hydralazine to 25mg  TID -Start imdur 15mg  qHS -Low Na diet  #Severe LVH likely with both HCM and HTN heart disease: Basal septum 41mm and 54mm in posterior wall. Likely mixed HCM and HTN induced. Cardiac monitor with brief runs of NSVT but no sustained VT. May need ICD and referral to EP.  -May need ICD and referral to EP  -Continue entresto 97/103mg  BID -Continue coreg 25mg  BID -Continue spironolactone 25mg  daily -Increase hydralazine to 25mg  TID -Start imdur 15mg  qHS  #Chest tightness: Suspect secondary to recent URI. No significant disease on cath. Bps much better controlled. No exertional or HF symptoms. -Continue management of URI -Will start low dose imdur 15mg  daily  #Severe ascending aortic dilation: 4.7 cm by CTA 05/22/2020.  Ascending aorta 45 mm by cardiac stress MRI.  -Follow with Dr. Orvan Seen of cardiothoracic  surgery.  #Nonobstructive CAD: LHC 08/31/2020 with mild to moderate nonobstructive disease.  -Continue ASA 81mg  daily -Continue liptor 40mg  daily -Continue coreg 25mg  BID  #HLD -Continue liptor 40mg  daily  #Moderate AI: #Mild to moderate MR: Moderate AI on CMR and appeared mild on TTE 02/2021. Mild MR noted on TTE 02/2021. -Serial TTE monitoring  #Tobacco use: -Smoking cessation counseling  Follow-up in 6 months.  Medication Adjustments/Labs and Tests Ordered: Current medicines are reviewed at length with the patient today.  Concerns regarding medicines are outlined above.   No orders of the defined types were placed in this encounter.  Meds ordered this encounter  Medications   hydrALAZINE (APRESOLINE) 25 MG tablet    Sig: Take 1 tablet (25 mg total) by mouth 3 (three) times daily.    Dispense:  270 tablet    Refill:  2    Dose increase   isosorbide mononitrate (IMDUR) 30 MG 24 hr tablet    Sig: Take 0.5 tablets (15 mg total) by mouth daily.    Dispense:  45 tablet    Refill:  3   There are no Patient Instructions on file for this visit.  I,Mathew Stumpf,acting as a Education administrator for Thomas Bergeron, MD.,have documented all relevant documentation on the behalf of Thomas Bergeron, MD,as directed by  Thomas Bergeron, MD while in the presence of Thomas Bergeron, MD.  I, Thomas Bergeron, MD, have reviewed all documentation for this visit. The documentation on 04/14/21 for the exam, diagnosis, procedures, and orders are all accurate and complete.  Signed, Thomas Bergeron, MD  04/14/2021 8:28 AM    [place]

## 2021-04-14 NOTE — Telephone Encounter (Signed)
Pt's medication was sent to pt's pharmacy as requested. Confirmation received.  °

## 2021-04-15 IMAGING — DX DG CHEST 2V
2 series · 2 of 2 positions shown · non-contrast
Comparison: None

CLINICAL DATA: Chest pain and SOB.

EXAM:
CHEST - 2 VIEW

[chest pa]
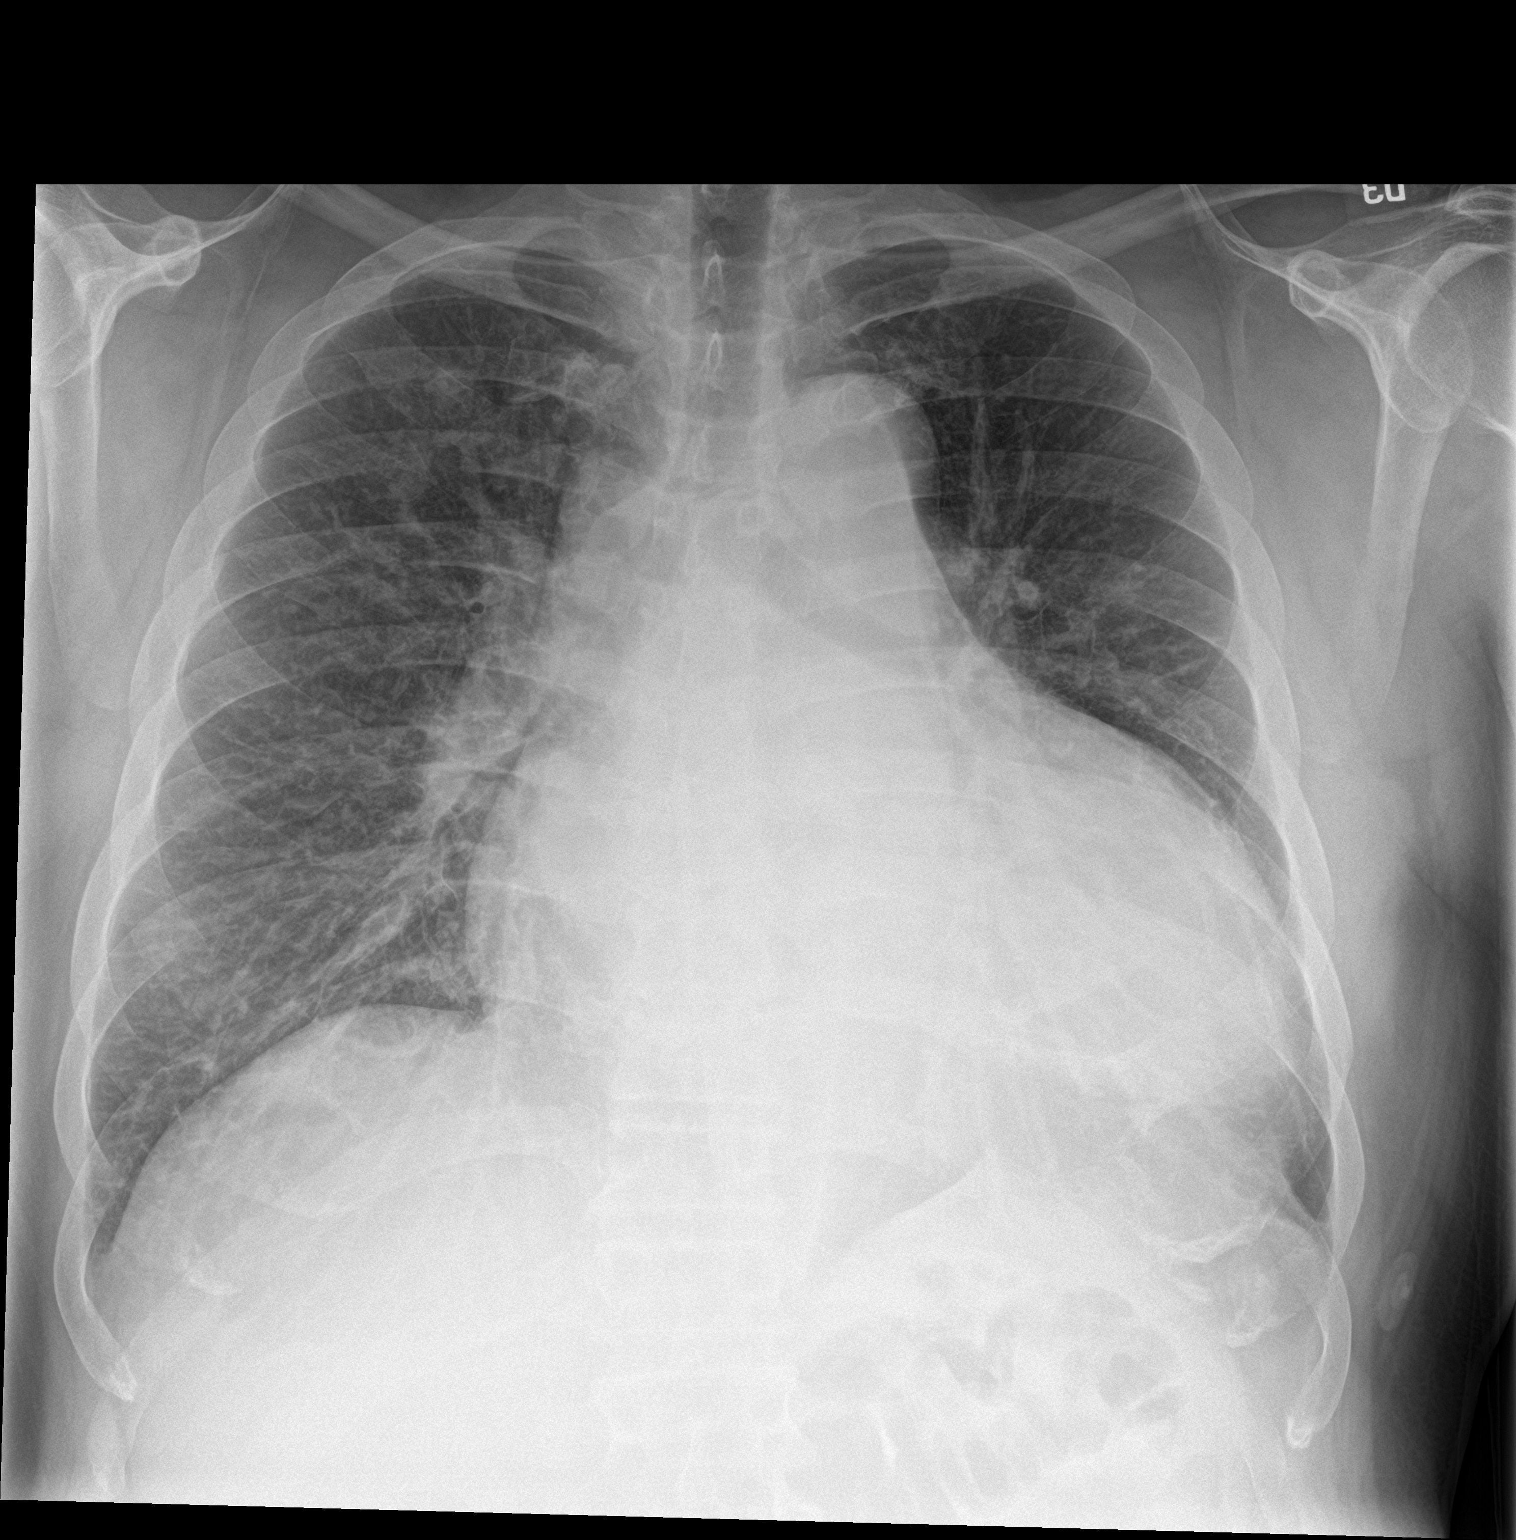

[chest lat]
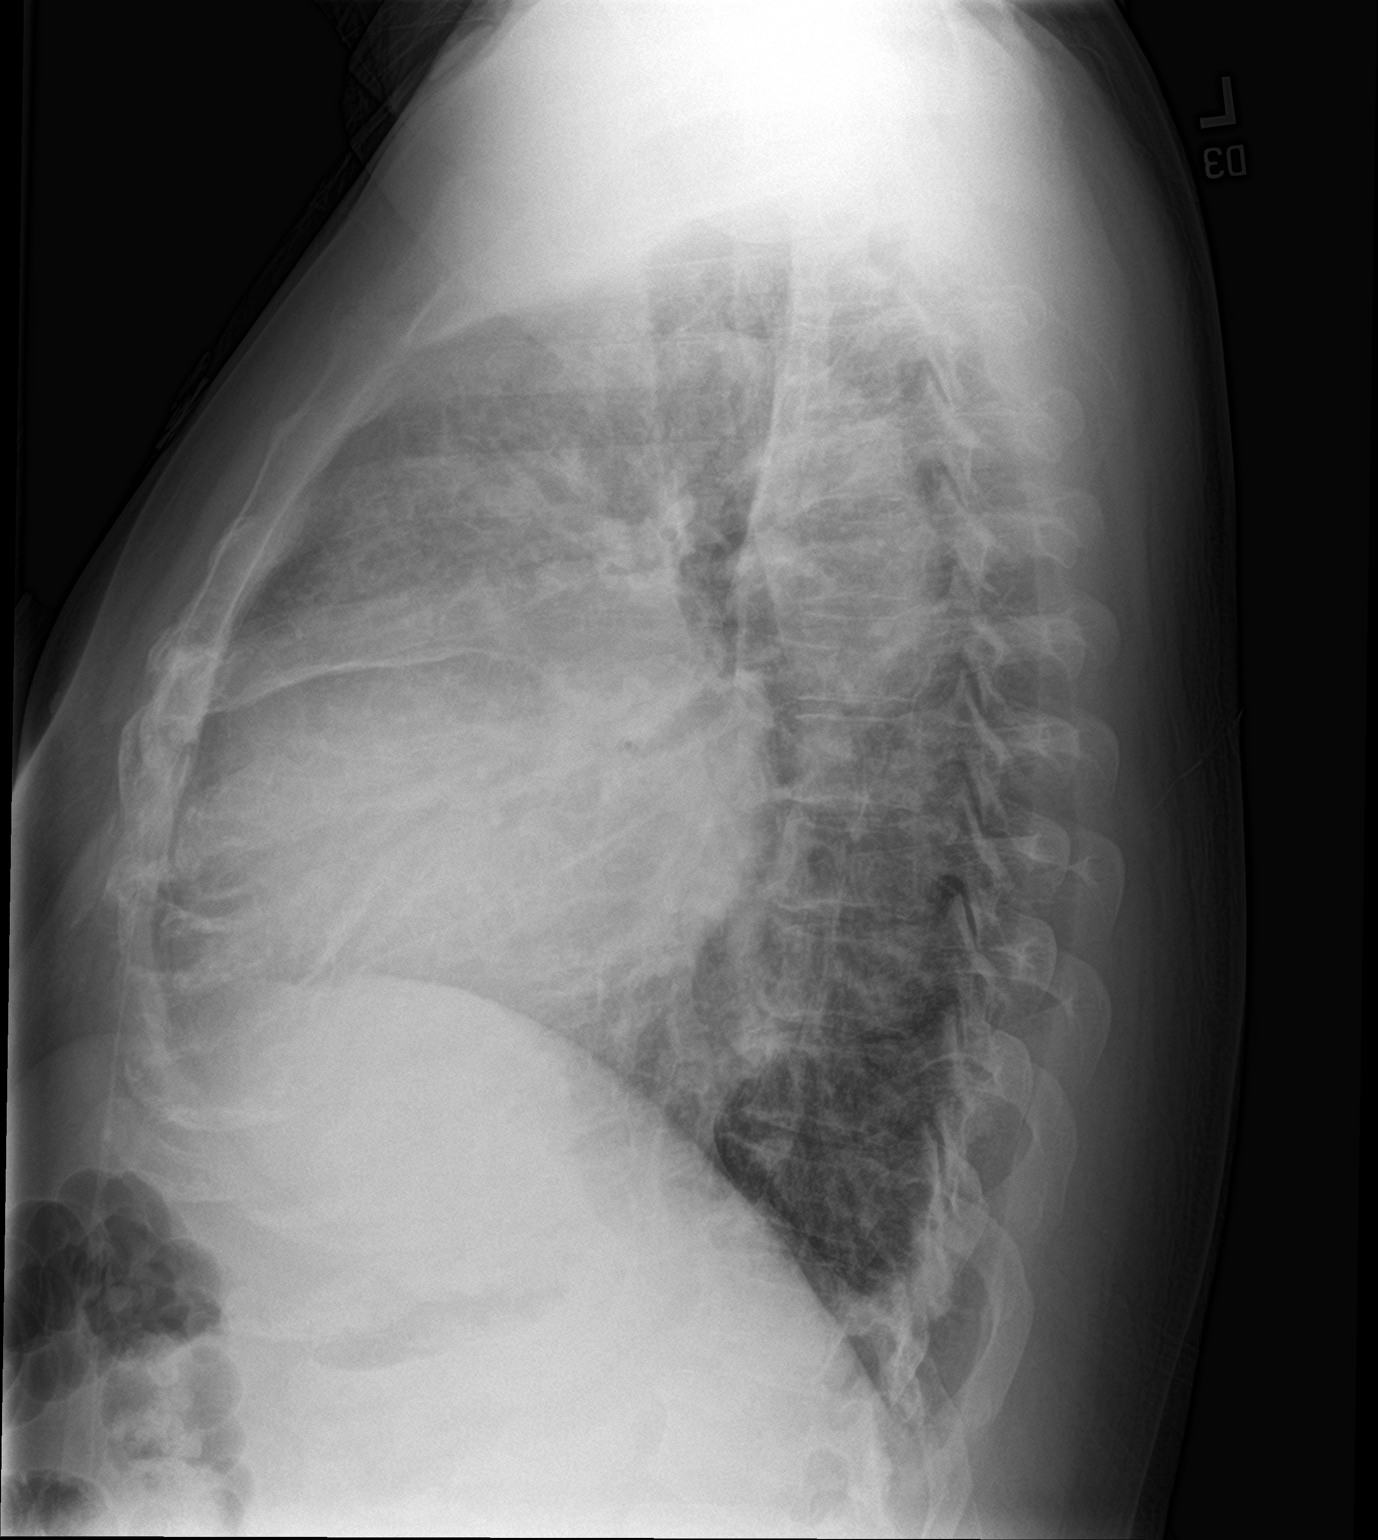

[2 of 2 positions shown; findings below may reference images not displayed]

FINDINGS: There is marked cardiac enlargement. Mild diffuse interstitial
edema. No pleural effusion or airspace consolidation. Visualized
osseous structures are notable for mild thoracic degenerative disc
disease.
IMPRESSION: Cardiac enlargement and mild interstitial edema.

## 2021-04-15 IMAGING — CT CT ANGIO CHEST
2 of 7 series · 17 of 46 positions shown · IV contrast (APPLIED)
Comparison: Chest x-ray 05/22/2020

CLINICAL DATA: Cough, elevated D-dimer

EXAM:
CT ANGIOGRAPHY CHEST WITH CONTRAST
TECHNIQUE: Multidetector CT imaging of the chest was performed using the
standard protocol during bolus administration of intravenous
contrast. Multiplanar CT image reconstructions and MIPs were
obtained to evaluate the vascular anatomy.
CONTRAST:  75mL OMNIPAQUE IOHEXOL 350 MG/ML SOLN

[Series 6: thins · axial · 0.76mm/px · z∈[-397,-115]mm · 14 of 455 slices shown]
[im 26/455  lung]
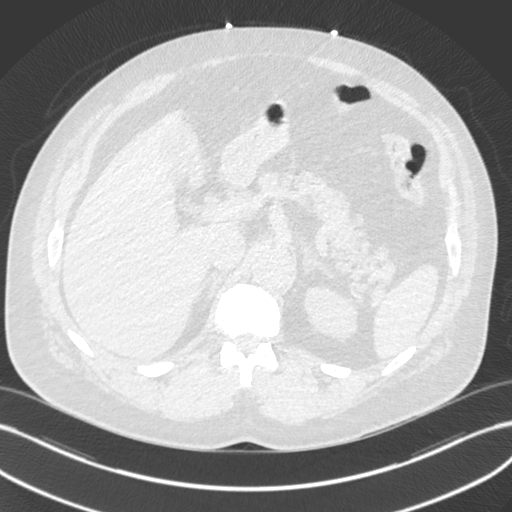
[im 51/455  soft-tissue]
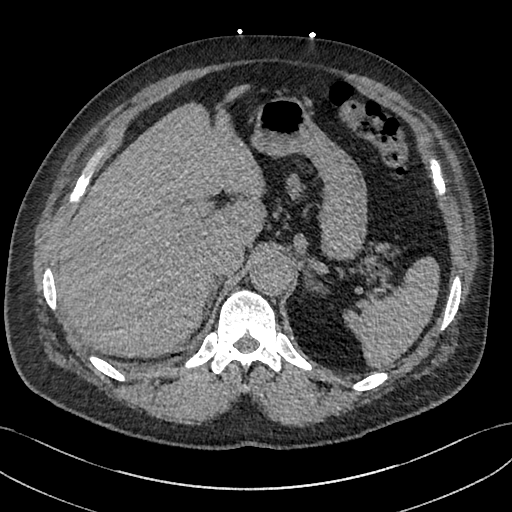
[im 101/455  lung]
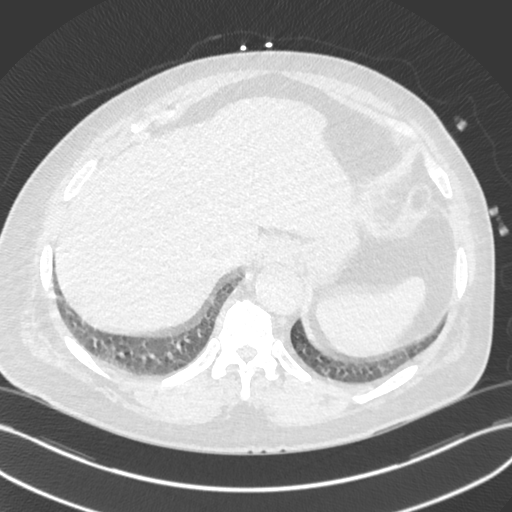
[im 127/455  soft-tissue]
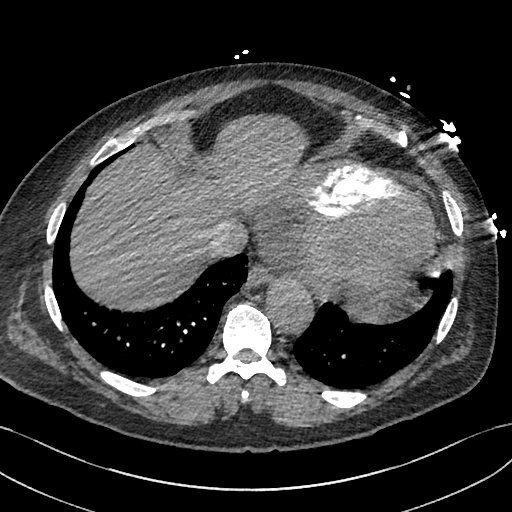
[im 152/455  lung]
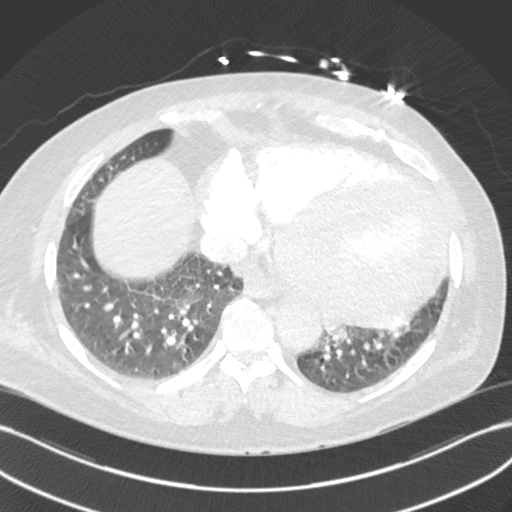
[im 177/455  soft-tissue]
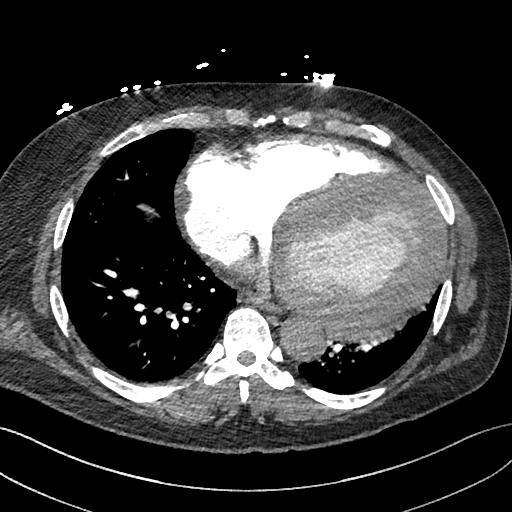
[im 202/455  lung]
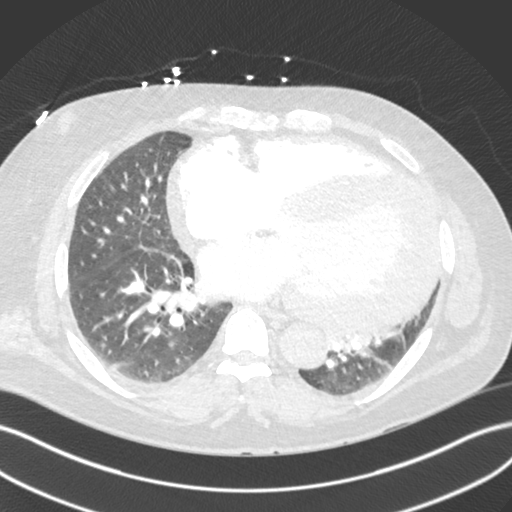
[im 253/455  soft-tissue]
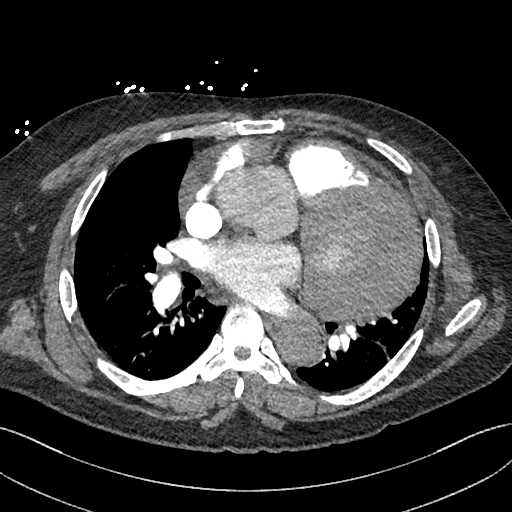
[im 278/455  lung]
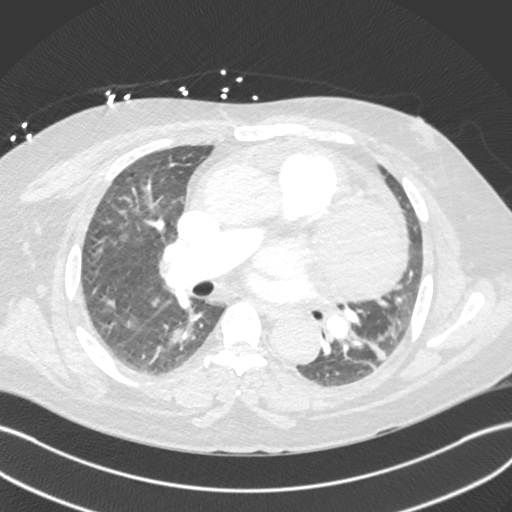
[im 303/455  soft-tissue]
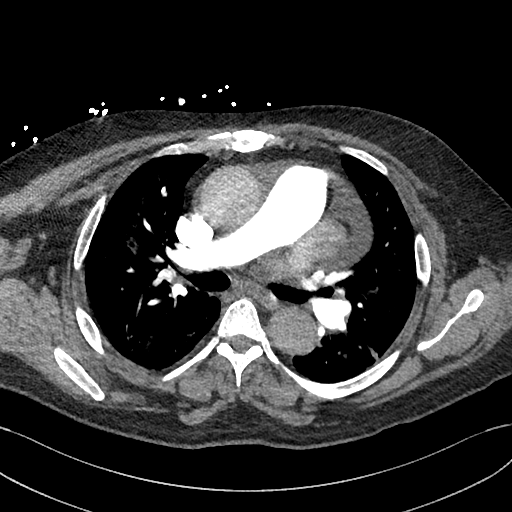
[im 328/455  lung]
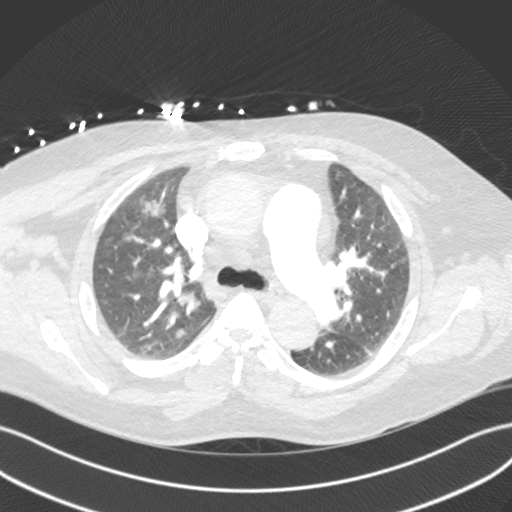
[im 354/455  soft-tissue]
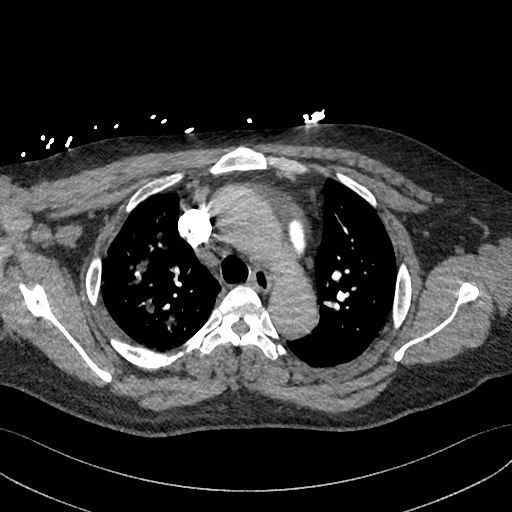
[im 404/455  lung]
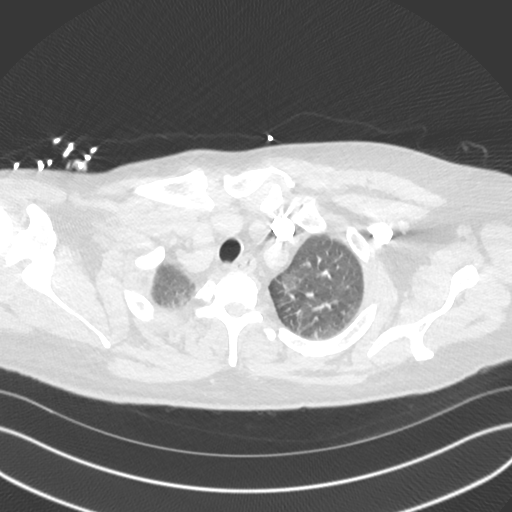
[im 429/455  soft-tissue]
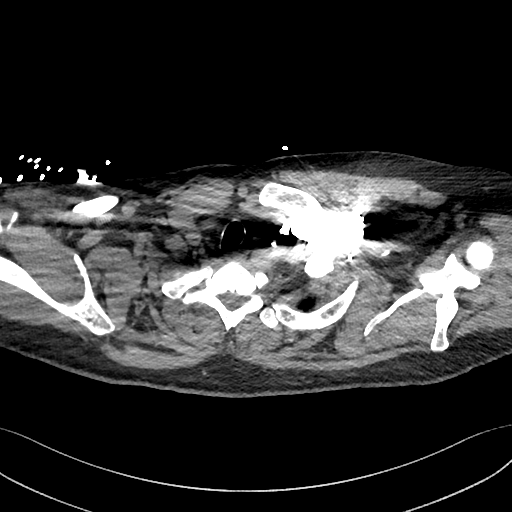

[Series 8: cor · coronal · 0.65mm/px · 3 of 164 slices shown]
[im 41/164  soft-tissue]
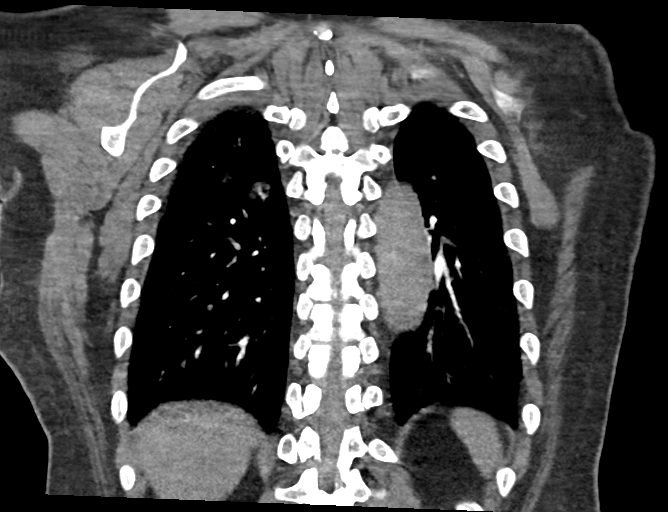
[im 82/164  soft-tissue]
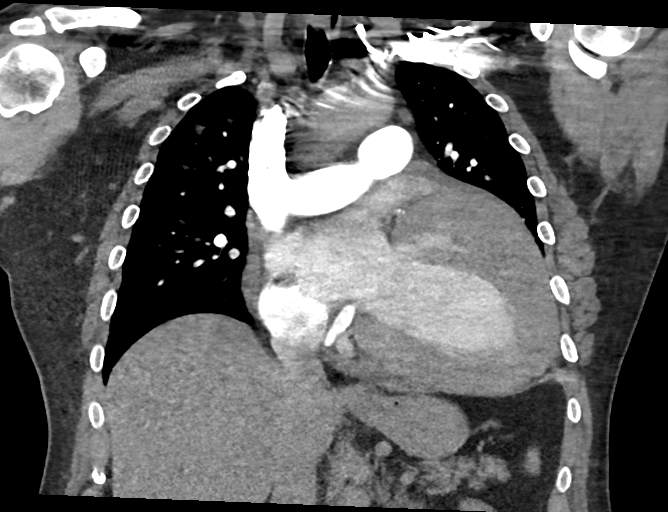
[im 123/164  soft-tissue]
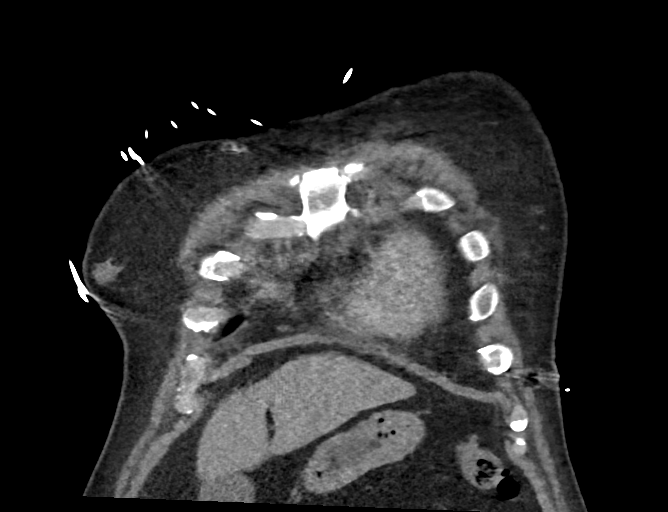

[17 of 46 positions shown; findings below may reference images not displayed]

FINDINGS: Cardiovascular: Marked four-chamber cardiomegaly with marked left
ventricular hypertrophy. Trace pericardial effusion. Satisfactory
opacification of the pulmonary arteries to the segmental branch
level. No filling defect identified to suggest pulmonary embolism.
Main pulmonary trunk is dilated measuring 4.1 cm in diameter.
Ascending thoracic aortic aneurysm measuring up to 4.7 cm in
diameter.

Mediastinum/Nodes: Enlarged precarinal lymph node measuring 1.4 cm
in diameter. 1.1 cm AP window node. Mildly prominent right hilar
lymph nodes. No axillary or left hilar lymphadenopathy. No
abnormality of the thyroid, trachea, or esophagus is seen.

Lungs/Pleura: Multifocal patchy airspace opacity most pronounced
within the right upper lobe with additional areas of opacity present
in the superior segment of the right lower lobe as well as within
the right middle lobe. Left basilar atelectasis. No pleural
effusion. No pneumothorax.

Upper Abdomen: No acute abnormality.

Musculoskeletal: No acute osseous abnormality. Bilateral
gynecomastia.

Review of the MIP images confirms the above findings.
IMPRESSION: 1. No evidence of acute pulmonary embolism.
2. Multifocal patchy airspace opacity within the right lung most
pronounced within the right upper lobe. Findings are concerning for
multifocal pneumonia. Follow-up to resolution is recommended.
3. Ascending thoracic aortic aneurysm measuring up to 4.7 cm in
diameter. Recommend semi-annual imaging followup by CTA or MRA and
referral to cardiothoracic surgery if not already obtained. This
recommendation follows 3404
ACCF/AHA/AATS/ACR/ASA/SCA/BANTEN/LUO/PENOSHILI/FAVIO Guidelines for the
Diagnosis and Management of Patients With Thoracic Aortic Disease.
Circulation. 3404; 121: E266-e369. Aortic aneurysm NOS (AC0N1-VI4.F)
4. Marked four-chamber cardiomegaly with marked left ventricular
hypertrophy.
5. Dilated main pulmonary trunk measuring 4.1 cm in diameter,
suggesting pulmonary arterial hypertension.
6. Mildly enlarged mediastinal and right hilar lymph nodes, likely
reactive.

## 2021-05-01 ENCOUNTER — Other Ambulatory Visit: Payer: Self-pay | Admitting: Surgery

## 2021-05-01 DIAGNOSIS — I7121 Aneurysm of the ascending aorta, without rupture: Secondary | ICD-10-CM

## 2021-05-08 ENCOUNTER — Other Ambulatory Visit: Payer: Self-pay

## 2021-05-08 MED ORDER — ENTRESTO 97-103 MG PO TABS: 1.0000 | ORAL_TABLET | Freq: Two times a day (BID) | ORAL | 3 refills | Status: DC

## 2021-06-21 ENCOUNTER — Ambulatory Visit
Admission: RE | Admit: 2021-06-21 | Discharge: 2021-06-21 | Disposition: A | Discharge status: Home / Self Care | Payer: Self-pay | Source: Ambulatory Visit | Attending: Surgery | Admitting: Surgery

## 2021-06-21 ENCOUNTER — Ambulatory Visit: Payer: 59 | Admitting: Surgery

## 2021-06-21 DIAGNOSIS — I7121 Aneurysm of the ascending aorta, without rupture: Secondary | ICD-10-CM

## 2021-06-22 ENCOUNTER — Ambulatory Visit: Payer: 59 | Admitting: Surgery

## 2021-07-17 ENCOUNTER — Other Ambulatory Visit: Payer: Self-pay | Admitting: Family

## 2021-07-17 NOTE — Telephone Encounter (Signed)
Rx(s) sent to pharmacy electronically.  

## 2021-08-01 ENCOUNTER — Encounter: Payer: Self-pay | Admitting: *Deleted

## 2021-09-04 ENCOUNTER — Other Ambulatory Visit: Payer: Self-pay

## 2021-09-04 ENCOUNTER — Emergency Department (HOSPITAL_COMMUNITY): Payer: 59

## 2021-09-04 ENCOUNTER — Ambulatory Visit (HOSPITAL_COMMUNITY)
Admission: EM | Admit: 2021-09-04 | Discharge: 2021-09-04 | Disposition: A | Discharge status: Other Acute Inpatient Hospital | Payer: 59 | Attending: Internal Medicine | Admitting: Internal Medicine

## 2021-09-04 ENCOUNTER — Encounter (HOSPITAL_COMMUNITY): Payer: Self-pay | Admitting: *Deleted

## 2021-09-04 ENCOUNTER — Emergency Department (HOSPITAL_COMMUNITY)
Admission: EM | Admit: 2021-09-04 | Discharge: 2021-09-04 | Discharge status: Against Medical Advice | Payer: 59 | Attending: Emergency Medicine | Admitting: Emergency Medicine

## 2021-09-04 DIAGNOSIS — Z79899 Other long term (current) drug therapy: Secondary | ICD-10-CM | POA: Insufficient documentation

## 2021-09-04 DIAGNOSIS — R001 Bradycardia, unspecified: Secondary | ICD-10-CM | POA: Diagnosis not present

## 2021-09-04 DIAGNOSIS — Z7982 Long term (current) use of aspirin: Secondary | ICD-10-CM | POA: Diagnosis not present

## 2021-09-04 DIAGNOSIS — R079 Chest pain, unspecified: Secondary | ICD-10-CM | POA: Diagnosis present

## 2021-09-04 DIAGNOSIS — I7143 Infrarenal abdominal aortic aneurysm, without rupture: Secondary | ICD-10-CM | POA: Diagnosis not present

## 2021-09-04 DIAGNOSIS — R778 Other specified abnormalities of plasma proteins: Secondary | ICD-10-CM | POA: Insufficient documentation

## 2021-09-04 DIAGNOSIS — I7121 Aneurysm of the ascending aorta, without rupture: Secondary | ICD-10-CM | POA: Diagnosis not present

## 2021-09-04 LAB — I-STAT CHEM 8, ED
BUN: 28 mg/dL — ABNORMAL HIGH (ref 6–20)
Calcium, Ion: 1.19 mmol/L (ref 1.15–1.40)
Chloride: 104 mmol/L (ref 98–111)
Creatinine, Ser: 1.4 mg/dL — ABNORMAL HIGH (ref 0.61–1.24)
Glucose, Bld: 84 mg/dL (ref 70–99)
HCT: 44 % (ref 39.0–52.0)
Hemoglobin: 15 g/dL (ref 13.0–17.0)
Potassium: 4.5 mmol/L (ref 3.5–5.1)
Sodium: 137 mmol/L (ref 135–145)
TCO2: 26 mmol/L (ref 22–32)

## 2021-09-04 LAB — COMPREHENSIVE METABOLIC PANEL
ALT: 30 U/L (ref 0–44)
AST: 35 U/L (ref 15–41)
Albumin: 3.3 g/dL — ABNORMAL LOW (ref 3.5–5.0)
Alkaline Phosphatase: 70 U/L (ref 38–126)
Anion gap: 8 (ref 5–15)
BUN: 21 mg/dL — ABNORMAL HIGH (ref 6–20)
CO2: 23 mmol/L (ref 22–32)
Calcium: 8.8 mg/dL — ABNORMAL LOW (ref 8.9–10.3)
Chloride: 105 mmol/L (ref 98–111)
Creatinine, Ser: 1.35 mg/dL — ABNORMAL HIGH (ref 0.61–1.24)
GFR, Estimated: >60 mL/min (ref >60)
Glucose, Bld: 86 mg/dL (ref 70–99)
Potassium: 4.6 mmol/L (ref 3.5–5.1)
Sodium: 136 mmol/L (ref 135–145)
Total Bilirubin: 0.7 mg/dL (ref 0.3–1.2)
Total Protein: 6.6 g/dL (ref 6.5–8.1)

## 2021-09-04 LAB — CBC WITH DIFFERENTIAL/PLATELET
Abs Immature Granulocytes: 0.06 10*3/uL (ref 0.00–0.07)
Basophils Absolute: 0.1 10*3/uL (ref 0.0–0.1)
Basophils Relative: 1 %
Eosinophils Absolute: 0.2 10*3/uL (ref 0.0–0.5)
Eosinophils Relative: 2 %
HCT: 44.4 % (ref 39.0–52.0)
Hemoglobin: 14.5 g/dL (ref 13.0–17.0)
Immature Granulocytes: 1 %
Lymphocytes Relative: 19 %
Lymphs Abs: 2.2 10*3/uL (ref 0.7–4.0)
MCH: 29.5 pg (ref 26.0–34.0)
MCHC: 32.7 g/dL (ref 30.0–36.0)
MCV: 90.4 fL (ref 80.0–100.0)
Monocytes Absolute: 0.7 10*3/uL (ref 0.1–1.0)
Monocytes Relative: 6 %
Neutro Abs: 8.1 10*3/uL — ABNORMAL HIGH (ref 1.7–7.7)
Neutrophils Relative %: 71 %
Platelets: 262 10*3/uL (ref 150–400)
RBC: 4.91 MIL/uL (ref 4.22–5.81)
RDW: 13.3 % (ref 11.5–15.5)
WBC: 11.4 10*3/uL — ABNORMAL HIGH (ref 4.0–10.5)
nRBC: 0 % (ref 0.0–0.2)

## 2021-09-04 LAB — TROPONIN I (HIGH SENSITIVITY)
Troponin I (High Sensitivity): 39 ng/L — ABNORMAL HIGH (ref <18)
Troponin I (High Sensitivity): 44 ng/L — ABNORMAL HIGH (ref <18)

## 2021-09-04 MED ORDER — IOHEXOL 350 MG/ML SOLN
100.0000 mL | Freq: Once | INTRAVENOUS | Status: AC | PRN
Start: 2021-09-04 — End: 2021-09-04
  Administered 2021-09-04: 100 mL via INTRAVENOUS

## 2021-09-04 MED ORDER — LACTATED RINGERS IV BOLUS
1000.0000 mL | Freq: Once | INTRAVENOUS | Status: AC
  Administered 2021-09-04: 1000 mL via INTRAVENOUS

## 2021-09-04 MED ORDER — IOHEXOL 350 MG/ML SOLN
100.0000 mL | Freq: Once | INTRAVENOUS | Status: AC | PRN
  Administered 2021-09-04: 100 mL via INTRAVENOUS

## 2021-09-04 NOTE — ED Provider Notes (Signed)
Thomas Spears    CSN: [id number] Arrival date & time: 09/04/21  1203      History   Chief Complaint Chief Complaint  Patient presents with   Chest Pain    HPI Thomas Spears is a 46 y.o. male.    Chest Pain  Here for chest pain that began at 530 this morning.  It is currently 1240 in the afternoon.  No diaphoresis or vomiting.  He does have a past history of aortic regurgitation and aorta dilation.  He is also had systolic and diastolic heart failure and his problem list  Past Medical History:  Diagnosis Date   Aortic regurgitation    07/2020 cardiac MRI moderate aortic regurgitation   Ascending aorta dilation (HCC)    05/01/20 CTA 4.7 cm. 08/19/20 cardiac stress MRI 34m.   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   ED (erectile dysfunction) 06/08/2020   Severe uncontrolled hypertension 05/31/2020   Thoracic aortic aneurysm ([facility] 05/27/2020   HFrEF (heart failure with reduced ejection fraction) ([facility]    COVID-19     Past Surgical History:  Procedure Laterality Date   CAROTID STENT INSERTION     KNEE SURGERY Left    LEFT HEART CATH AND CORONARY ANGIOGRAPHY N/A 08/31/2020   Procedure: LEFT HEART CATH AND CORONARY ANGIOGRAPHY;  Surgeon: HLeonie Man MD;  [facility];  Service: Cardiovascular;  Laterality: N/A;   TONSILLECTOMY         Home Medications    Prior to Admission medications   Medication Sig Start Date End Date Taking? Authorizing Provider  acetaminophen (TYLENOL) 500 MG tablet Take 2  tablets (1,000 mg total) by mouth every 6 (six) hours as needed (pain). 03/20/21   PLattie Haw MD  aspirin EC 81 MG tablet Take 1 tablet (81 mg total) by mouth every evening. Swallow whole. 03/20/21   PLattie Haw MD  atorvastatin (LIPITOR) 40 MG tablet TAKE 1 TABLET(40 MG) BY MOUTH DAILY 07/17/21   PFreada Bergeron MD  benzonatate (TESSALON) 100 MG capsule Take 1 capsule (100 mg total) by mouth every 8 (eight) hours. 04/12/21   Raspet, EDerry Skill PA-C  carvedilol (COREG) 25 MG tablet Take 1.5 tablets (37.5 mg total) by mouth 2 (two) times daily. 12/05/20   PFreada Bergeron MD  dapagliflozin propanediol (FARXIGA) 10 MG TABS tablet Take 10 mg by mouth daily.    [provider]  hydrALAZINE (APRESOLINE) 25 MG tablet Take 1 tablet (25 mg total) by mouth 3 (three) times daily. 04/14/21   PFreada Bergeron MD  isosorbide mononitrate (IMDUR) 30 MG 24 hr tablet Take 0.5 tablets (15 mg total) by mouth daily. 04/14/21   PFreada Bergeron MD  sacubitril-valsartan (ENTRESTO) 97-103 MG Take 1 tablet by mouth 2 (two) times daily. 05/08/21   PFreada Bergeron MD  spironolactone (ALDACTONE) 25 MG tablet Take 1 tablet (25 mg total) by mouth daily. 02/16/21   PFreada Bergeron MD    Family History History reviewed. No pertinent  family history.  Social History Social History   Tobacco Use   Smoking status: Light Smoker   Smokeless tobacco: Never  Substance Use Topics   Alcohol use: Not Currently     Allergies   Mushroom extract complex   Review of Systems Review of Systems  Cardiovascular:  Positive for chest pain.     Physical Exam Triage Vital Signs ED Triage Vitals  Enc Vitals Group     BP 09/04/21 1227 121/80     Pulse Rate 09/04/21 1227 69     Resp 09/04/21 1227 18     Temp 09/04/21 1227 97.9 F (36.6 C)     Temp src --      SpO2 09/04/21 1227 99 %     Weight --      Height --      Head Circumference --      Peak Flow --      Pain Score 09/04/21  1226 6     Pain Loc --      Pain Edu? --      Excl. in [place]? --    No data found.  Updated Vital Signs BP 121/80   Pulse 69   Temp 97.9 F (36.6 C)   Resp 18   SpO2 99%   Visual Acuity Right Eye Distance:   Left Eye Distance:   Bilateral Distance:    Right Eye Near:   Left Eye Near:    Bilateral Near:     Physical Exam Vitals reviewed.  Constitutional:      General: He is not in acute distress.    Appearance: He is well-developed. He is not ill-appearing, toxic-appearing or diaphoretic.  Cardiovascular:     Rate and Rhythm: Normal rate and regular rhythm.  Pulmonary:     Effort: Pulmonary effort is normal.     Breath sounds: Normal breath sounds.  Neurological:     Mental Status: He is alert and oriented to person, place, and time.  Psychiatric:        Behavior: Behavior normal.      UC Treatments / Results  Labs (all labs ordered are listed, but only abnormal results are displayed) Labs Reviewed - No data to display  EKG   Radiology No results found.  Procedures Procedures (including critical care time)  Medications Ordered in UC Medications - No data to display  Initial Impression / Assessment and Plan / UC Course  I have reviewed the triage vital signs and the nursing notes.  Pertinent labs & imaging results that were available during my care of the patient were reviewed by me and considered in my medical decision making (see chart for details).     His EKG is abnormal but does not show ST segment elevation or depression.  With his history and the history of chest pain I think it best that he be transported to the emergency room by ambulance transport for further evaluation treatment Final Clinical Impressions(s) / UC Diagnoses   Final diagnoses:  None   Discharge Instructions   None    ED Prescriptions   None    PDMP not reviewed this encounter.   Barrett Henle, MD 09/04/21 [phone]

## 2021-09-04 NOTE — ED Provider Notes (Signed)
[facility] Provider Note   CSN: [id number] Arrival date & time: 09/04/21  1329     History  Chief Complaint  Patient presents with   Chest Pain    Thomas Spears is a 46 y.o. male.  Past medical history of multiple medical issues including history of aortic aneurysm presenting to the ER due to concern for chest pain.  Pain started this morning, started mildly but has progressively gotten worse.  Up to 7 out of 10 in severity, seems to have gotten better throughout the afternoon, no obvious alleviating or aggravating factors.  Now is pain-free.  He denies any sort of difficulty in breathing.  HPI     Home Medications Prior to Admission medications   Medication Sig Start Date End Date Taking? Authorizing Provider  acetaminophen (TYLENOL) 500 MG tablet Take 2 tablets (1,000 mg total) by mouth every 6 (six) hours as needed (pain). Patient taking differently: Take 1,000 mg by mouth every 6 (six) hours as needed for moderate pain (pain). 03/20/21  Yes Lattie Haw, MD  aspirin EC 81 MG tablet Take 1 tablet (81 mg total) by mouth every evening. Swallow whole. 03/20/21  Yes Lattie Haw, MD  atorvastatin (LIPITOR) 40 MG tablet TAKE 1 TABLET(40 MG) BY MOUTH DAILY Patient taking differently: Take 40 mg by mouth every evening. 07/17/21  Yes Pemberton, Greer Ee, MD  carvedilol (COREG) 25 MG tablet Take 1.5 tablets (37.5 mg total) by mouth 2 (two) times daily. 12/05/20  Yes Freada Bergeron, MD  dapagliflozin propanediol (FARXIGA) 10 MG TABS tablet Take 10 mg by mouth daily.   Yes [provider]  hydrALAZINE (APRESOLINE) 25 MG tablet Take 1 tablet (25 mg total) by mouth 3 (three) times daily. 04/14/21  Yes Freada Bergeron, MD  isosorbide mononitrate (IMDUR) 30 MG 24 hr tablet Take 0.5 tablets (15 mg total) by mouth daily. 04/14/21  Yes Pemberton, Greer Ee, MD  sacubitril-valsartan (ENTRESTO) 97-103 MG Take 1 tablet by mouth 2 (two) times  daily. 05/08/21  Yes Freada Bergeron, MD  spironolactone (ALDACTONE) 25 MG tablet Take 1 tablet (25 mg total) by mouth daily. 02/16/21  Yes Freada Bergeron, MD  benzonatate (TESSALON) 100 MG capsule Take 1 capsule (100 mg total) by mouth every 8 (eight) hours. Patient not taking: Reported on 09/04/2021 04/12/21   Raspet, Derry Skill, PA-C      Allergies    Mushroom extract complex    Review of Systems   Review of Systems  Constitutional:  Negative for chills and fever.  HENT:  Negative for ear pain and sore throat.   Eyes:  Negative for pain and visual disturbance.  Respiratory:  Positive for chest tightness. Negative for cough and shortness of breath.   Cardiovascular:  Positive for chest pain. Negative for palpitations.  Gastrointestinal:  Negative for abdominal pain and vomiting.  Genitourinary:  Negative for dysuria and hematuria.  Musculoskeletal:  Negative for arthralgias and back pain.  Skin:  Negative for color change and rash.  Neurological:  Negative for seizures and syncope.  All other systems reviewed and are negative.   Physical Exam Updated Vital Signs BP (!) 157/96   Pulse (!) 50   Temp 97.6 F (36.4 C) (Oral)   Resp 18   SpO2 96%  Physical Exam Vitals and nursing note reviewed.  Constitutional:      General: He is not in acute distress.    Appearance: He is well-developed.  HENT:  Head: Normocephalic and atraumatic.  Eyes:     Conjunctiva/sclera: Conjunctivae normal.  Cardiovascular:     Rate and Rhythm: Normal rate and regular rhythm.     Heart sounds: No murmur heard. Pulmonary:     Effort: Pulmonary effort is normal. No respiratory distress.     Breath sounds: Normal breath sounds.  Abdominal:     Palpations: Abdomen is soft.     Tenderness: There is no abdominal tenderness.  Musculoskeletal:        General: No swelling.     Cervical back: Neck supple.  Skin:    General: Skin is warm and dry.     Capillary Refill: Capillary refill takes  less than 2 seconds.  Neurological:     Mental Status: He is alert.  Psychiatric:        Mood and Affect: Mood normal.     ED Results / Procedures / Treatments   Labs (all labs ordered are listed, but only abnormal results are displayed) Labs Reviewed  CBC WITH DIFFERENTIAL/PLATELET - Abnormal; Notable for the following components:      Result Value   WBC 11.4 (*)    Neutro Abs 8.1 (*)    All other components within normal limits  COMPREHENSIVE METABOLIC PANEL - Abnormal; Notable for the following components:   BUN 21 (*)    Creatinine, Ser 1.35 (*)    Calcium 8.8 (*)    Albumin 3.3 (*)    All other components within normal limits  I-STAT CHEM 8, ED - Abnormal; Notable for the following components:   BUN 28 (*)    Creatinine, Ser 1.40 (*)    All other components within normal limits  TROPONIN I (HIGH SENSITIVITY) - Abnormal; Notable for the following components:   Troponin I (High Sensitivity) 39 (*)    All other components within normal limits  TROPONIN I (HIGH SENSITIVITY) - Abnormal; Notable for the following components:   Troponin I (High Sensitivity) 44 (*)    All other components within normal limits    EKG None  Radiology CT Angio Chest/Abd/Pel for Dissection W and/or Wo Contrast  Result Date: 09/04/2021 CLINICAL DATA:  Chest pain since 4:45 a.m., history of known aortic aneurysm in a 46 year old male. EXAM: CT ANGIOGRAPHY CHEST, ABDOMEN AND PELVIS TECHNIQUE: Non-contrast CT of the chest was initially obtained. Multidetector CT imaging through the chest, abdomen and pelvis was performed using the standard protocol during bolus administration of intravenous contrast. Multiplanar reconstructed images and MIPs were obtained and reviewed to evaluate the vascular anatomy. RADIATION DOSE REDUCTION: This exam was performed according to the departmental dose-optimization program which includes automated exposure control, adjustment of the mA and/or kV according to patient size  and/or use of iterative reconstruction technique. CONTRAST:  100 mL Omnipaque 350 COMPARISON:  June 01, 2021. FINDINGS: CTA CHEST FINDINGS Cardiovascular: Noncontrast appearance of the thoracic aorta without signs of intramural hematoma. No hemopericardium. Calcified coronary artery disease. Aorta with standard three-vessel branching pattern. No signs of acute aortic process. Redemonstration of sinus of Valsalva aneurysm. Ascending thoracic aortic caliber at 4.1 cm in the coronal plane within 1-2 mm of previous measurement. Aortic sinus dilated to 4.8 cm also measured in the coronal plane unchanged as far back as May 22, 2020. No stranding adjacent to the thoracic aorta. Just above the LEFT coronary cusp is an area of irregularity with focal potential contour bulging along the medial wall of the thoracic aorta best seen on image 63/9. The possibility of small dissection  flap is illustrated on this image. There is evidence of motion artifact but this is above this level on this non gated assessment. Pulmonary vasculature with normal appearance. No signs of pulmonary embolism. Heart size moderately enlarged to markedly enlarged as before. Mediastinum/Nodes: Esophagus is normal. No signs of adenopathy. Lungs/Pleura: No effusion. No consolidative changes. No suspicious nodules. Airways are patent. Musculoskeletal: See below for full musculoskeletal details. Review of the MIP images confirms the above findings. CTA ABDOMEN AND PELVIS FINDINGS VASCULAR Aorta: 2.9 cm infrarenal abdominal aortic aneurysm just above the iliac bifurcation. Celiac: Patent without evidence of aneurysm, dissection, vasculitis or significant stenosis. SMA: Origin is widely patent. There is some motion related artifact and beam hardening artifact leading to some distortion of the vessel best appreciated in the coronal plane. No surrounding stranding. There is some soft plaque in the vessel lumen. There may be mild proximal dilation of the  vessel. Renals: Single renal arteries bilaterally. Atherosclerotic plaque in the RIGHT mid renal artery with moderate narrowing of the vessel in the midportion of the vessel. IMA: Mild narrowing at the origin, patent distally with good opacification and no aneurysmal dilation. Inflow: Patent without evidence of aneurysm, dissection, vasculitis or significant stenosis. Veins: No obvious venous abnormality within the limitations of this arterial phase study. Outflow: Patent outflow into the upper thigh. Review of the MIP images confirms the above findings. NON-VASCULAR Hepatobiliary: Normal contour and size. No pericholecystic stranding or biliary duct dilation. Pancreas: Normal, without mass, inflammation or ductal dilatation. Spleen: Normal. Adrenals/Urinary Tract: Adrenal glands are normal. Symmetric renal enhancement without hydronephrosis. Forty-two Hounsfield unit lesion arises from the upper pole the LEFT kidney posteriorly measuring 2.9 x 2.5 cm this is new compared to March of 2017 and measured 41 Hounsfield units on the noncontrast study from June 21, 2021. Minimal surrounding stranding of this lesion is unchanged. Urinary bladder is collapsed without adjacent stranding. No suspicious renal lesions on the RIGHT. Stomach/Bowel: Normal appendix. Stomach without adjacent stranding. Small bowel is of normal caliber. Appendix is normal. Colon without signs of adjacent stranding. Lymphatic: No adenopathy. Reproductive: Unremarkable by CT. Other: No ascites. Musculoskeletal: Spinal degenerative changes without acute or destructive bone finding. Review of the MIP images confirms the above findings. IMPRESSION: 1. Stable dilation of the aortic sinus approximately 4.8 cm with stable mild aneurysm of the ascending thoracic aorta. 2. Potential small dissection flap just above the LEFT coronary cusp. This is below an area that is clearly affected by motion on this non gated assessment but cannot be clearly attributed to  motion at the level of the abnormality. Motion artifact is still a possibility on this non gated scan. Would suggest cardiothoracic consult and follow-up gated CT angiography of the chest. 3. Cardiomegaly. 4. 2.9 cm infrarenal abdominal aortic aneurysm. Recommend follow-up every 5 years. Reference: J Am Coll Radiol 1610;96:045-409. 5. Moderate RIGHT renal artery stenosis. 6. Findings suspicious for solid renal neoplasm. Follow-up CT renal protocol or MRI with and without contrast is suggested when the patient is able, preferably as an outpatient outside of the acute setting. These results were called by telephone at the time of interpretation on 09/04/2021 at 6:53 pm to provider Dr. Roslynn Amble, who verbally acknowledged these results. Electronically Signed   By: Zetta Bills M.D.   On: 09/04/2021 18:58   DG Chest 2 View  Result Date: 09/04/2021 CLINICAL DATA:  A 46 year old male presents with history of chest pain. EXAM: CHEST - 2 VIEW COMPARISON:  April 12, 2021. FINDINGS: EKG leads  stickers project over the chest. Moderate cardiomegaly similar to previous imaging. No lobar consolidation. No sign of pleural effusion or pneumothorax. On limited assessment there is no acute skeletal process. IMPRESSION: 1. Moderate cardiomegaly, similar to previous imaging. 2. No acute cardiopulmonary disease. Electronically Signed   By: Zetta Bills M.D.   On: 09/04/2021 15:04    Procedures Procedures    Medications Ordered in ED Medications  iohexol (OMNIPAQUE) 350 MG/ML injection 100 mL (100 mLs Intravenous Contrast Given 09/04/21 1739)  lactated ringers bolus 1,000 mL (0 mLs Intravenous Stopped 09/04/21 2259)  iohexol (OMNIPAQUE) 350 MG/ML injection 100 mL (100 mLs Intravenous Contrast Given 09/04/21 2229)    ED Course/ Medical Decision Making/ A&P                           Medical Decision Making Amount and/or Complexity of Data Reviewed Radiology: ordered.  Risk Prescription drug  management.   46 year old gentleman with history of aortic aneurysm presenting to the ER due to concern for chest pain.  On physical exam well-appearing in no distress, vitals grossly stable, mild bradycardia noted.  EKG without ischemic change, troponin is mildly elevated, repeat troponin is stable.  Given no ongoing pain and stable minimally elevated troponins, I have lower suspicion for ACS.  CTA dissection study was obtained, the radiologist informed me that there is a potential small dissection flap just above the left coronary cusp.  He advises to discuss the case with CT surgery and likely get CT gated study.  I discussed the case with Dr. Prescott Gum who recommends getting a CT chest gated study to further clarify this area of concern.  I discussed both with the CT surgeon and with the radiologist regarding risks and benefits of doing a repeat contrast bolus, noted patient's creatinine to be borderline elevated though his GFR is normal.  Both CT surgeon and radiologist feel the benefits outweigh any risks given the overall clinical picture and concern for possible dissection.  I discussed this with patient and he is agreeable to get the CTA chest gated study.   If this is negative for dissection then anticipate patient can be discharged home and managed in the outpatient setting.  I discussed additional incidental findings with patient about lesion on his kidney and recommendations to get a CT renal protocol or MRI for further investigation.  I independently reviewed and interpreted CT imaging studies.   I was informed by RN staff that patient decided he was not willing to wait any longer and eloped from ER. I have asked Dr. Laverta Baltimore, the oncoming doc to f/u on the results and contact patient should there be any concerning acute findings on the gated CT study.         Final Clinical Impression(s) / ED Diagnoses Final diagnoses:  Aneurysm of ascending aorta without rupture (HCC)  Chest pain,  unspecified type  Infrarenal abdominal aortic aneurysm (AAA) without rupture [facility])    Rx / DC Orders ED Discharge Orders     None         Lucrezia Starch, MD 09/04/21 2308

## 2021-09-04 NOTE — ED Triage Notes (Addendum)
BIB [facility] after staff called at Urgent Care called to report increase C/P. Per EMS, pt was having C/P since 0500. Pt went into work and went to UC during lunch, but was unable to tolerate the pain.   HX:  stroke, HTN,

## 2021-09-04 NOTE — ED Notes (Signed)
DR Windy Carina eval PT . Pt trans to ED for CP.

## 2021-09-04 NOTE — ED Provider Triage Note (Signed)
Emergency Medicine Provider Triage Evaluation Note  Erian Rosengren , a 46 y.o. male  was evaluated in triage.  Pt complains of cChest pain.  This woke him up from sleep at about 0545 this morning.   His pain is in the left center of his chest.  He reports that he took 4 asa with ems and that helped some. Headache, no nausea or vomiting. Pain doesn't radiate.   He has been out of asa for about a week but other wise med compliant.   Physical Exam  BP 118/85 (BP Location: Right Arm)   Pulse (!) 58   Temp 97.6 F (36.4 C) (Oral)   Resp 16   SpO2 95%  Gen:   Awake, no distress   Resp:  Normal effort  MSK:   Moves extremities without difficulty  Other:  Normal speech.   Medical Decision Making  Medically screening exam initiated at 2:37 PM.  Appropriate orders placed.  Khadeem Rockett was informed that the remainder of the evaluation will be completed by another provider, this initial triage assessment does not replace that evaluation, and the importance of remaining in the ED until their evaluation is complete.  Patient presents today for evaluation of chest pain. 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 With his new chest pain we will check labs, chest x-ray, will order dissection study. He is not an extremis and appears very comfortable therefore with stable vitals I feel it is reasonable to wait for labs to result prior to CT scan.   Lorin Glass, [place] 09/04/21 2208

## 2021-09-04 NOTE — ED Notes (Signed)
Dykstra MD made aware of pts HR.

## 2021-09-04 NOTE — Discharge Instructions (Addendum)
Please follow-up with the cardiothoracic surgery office, your cardiologist and your primary care doctor.  Discussed the chest pain with your cardiologist and the cardiothoracic surgeon.   You also have an infrarenal aortic aneurysm in your abdomen, discussed this with your regular doctors for further monitoring.  Discussed the possible kidney mass with your primary care doctor, they can arrange for the appropriate CT scan or MRI for further evaluation.

## 2021-09-04 NOTE — ED Notes (Signed)
Pt wants to leave ama. Asked pt to wait for md. Pt refused and refused dc vitals. Md aware. Pt left with NAD NARD

## 2021-09-04 NOTE — ED Triage Notes (Signed)
Pt reports Lt sided CP woke him up at 0430 this morning. Pt reports he still has Lt sided CP. Pt has a significant Cardiac Hx .

## 2021-09-05 DIAGNOSIS — I493 Ventricular premature depolarization: Secondary | ICD-10-CM

## 2021-09-05 DIAGNOSIS — I420 Dilated cardiomyopathy: Secondary | ICD-10-CM

## 2021-09-06 ENCOUNTER — Encounter (HOSPITAL_COMMUNITY): Payer: Self-pay

## 2021-09-06 ENCOUNTER — Other Ambulatory Visit: Payer: Self-pay

## 2021-09-06 ENCOUNTER — Emergency Department (HOSPITAL_COMMUNITY): Payer: 59

## 2021-09-06 ENCOUNTER — Emergency Department (HOSPITAL_COMMUNITY)
Admission: EM | Admit: 2021-09-06 | Discharge: 2021-09-06 | Disposition: A | Discharge status: Home / Self Care | Payer: 59 | Attending: Emergency Medicine | Admitting: Emergency Medicine

## 2021-09-06 DIAGNOSIS — I2510 Atherosclerotic heart disease of native coronary artery without angina pectoris: Secondary | ICD-10-CM | POA: Insufficient documentation

## 2021-09-06 DIAGNOSIS — R7989 Other specified abnormal findings of blood chemistry: Secondary | ICD-10-CM | POA: Diagnosis not present

## 2021-09-06 DIAGNOSIS — I10 Essential (primary) hypertension: Secondary | ICD-10-CM | POA: Diagnosis not present

## 2021-09-06 DIAGNOSIS — R001 Bradycardia, unspecified: Secondary | ICD-10-CM | POA: Diagnosis not present

## 2021-09-06 DIAGNOSIS — R079 Chest pain, unspecified: Secondary | ICD-10-CM | POA: Insufficient documentation

## 2021-09-06 DIAGNOSIS — I509 Heart failure, unspecified: Secondary | ICD-10-CM | POA: Insufficient documentation

## 2021-09-06 DIAGNOSIS — Z7982 Long term (current) use of aspirin: Secondary | ICD-10-CM | POA: Insufficient documentation

## 2021-09-06 DIAGNOSIS — I340 Nonrheumatic mitral (valve) insufficiency: Secondary | ICD-10-CM | POA: Diagnosis not present

## 2021-09-06 DIAGNOSIS — I712 Thoracic aortic aneurysm, without rupture, unspecified: Secondary | ICD-10-CM | POA: Diagnosis not present

## 2021-09-06 DIAGNOSIS — I351 Nonrheumatic aortic (valve) insufficiency: Secondary | ICD-10-CM | POA: Diagnosis not present

## 2021-09-06 LAB — COMPREHENSIVE METABOLIC PANEL
ALT: 30 U/L (ref 0–44)
AST: 35 U/L (ref 15–41)
Albumin: 3.1 g/dL — ABNORMAL LOW (ref 3.5–5.0)
Alkaline Phosphatase: 66 U/L (ref 38–126)
Anion gap: 7 (ref 5–15)
BUN: 13 mg/dL (ref 6–20)
CO2: 25 mmol/L (ref 22–32)
Calcium: 9.1 mg/dL (ref 8.9–10.3)
Chloride: 108 mmol/L (ref 98–111)
Creatinine, Ser: 1.4 mg/dL — ABNORMAL HIGH (ref 0.61–1.24)
GFR, Estimated: >60 mL/min (ref >60)
Glucose, Bld: 117 mg/dL — ABNORMAL HIGH (ref 70–99)
Potassium: 4.1 mmol/L (ref 3.5–5.1)
Sodium: 140 mmol/L (ref 135–145)
Total Bilirubin: 0.9 mg/dL (ref 0.3–1.2)
Total Protein: 6.5 g/dL (ref 6.5–8.1)

## 2021-09-06 LAB — CBC
HCT: 43.2 % (ref 39.0–52.0)
Hemoglobin: 14.6 g/dL (ref 13.0–17.0)
MCH: 29.9 pg (ref 26.0–34.0)
MCHC: 33.8 g/dL (ref 30.0–36.0)
MCV: 88.3 fL (ref 80.0–100.0)
Platelets: 255 10*3/uL (ref 150–400)
RBC: 4.89 MIL/uL (ref 4.22–5.81)
RDW: 13.3 % (ref 11.5–15.5)
WBC: 8.9 10*3/uL (ref 4.0–10.5)
nRBC: 0 % (ref 0.0–0.2)

## 2021-09-06 LAB — TROPONIN I (HIGH SENSITIVITY)
Troponin I (High Sensitivity): 46 ng/L — ABNORMAL HIGH (ref <18)
Troponin I (High Sensitivity): 37 ng/L — ABNORMAL HIGH (ref <18)

## 2021-09-06 LAB — D-DIMER, QUANTITATIVE: D-Dimer, Quant: 0.32 ug/mL-FEU (ref 0.00–0.50)

## 2021-09-06 MED ORDER — ASPIRIN 81 MG PO CHEW
81.0000 mg | CHEWABLE_TABLET | Freq: Once | ORAL | Status: AC
Start: 2021-09-06 — End: 2021-09-06
  Administered 2021-09-06: 81 mg via ORAL
  Filled 2021-09-06: qty 1

## 2021-09-06 MED ORDER — IOHEXOL 350 MG/ML SOLN
200.0000 mL | Freq: Once | INTRAVENOUS | Status: AC | PRN
  Administered 2021-09-06: 200 mL via INTRAVENOUS

## 2021-09-06 NOTE — ED Provider Notes (Signed)
[facility] Provider Note   CSN: [id number] Arrival date & time: 09/06/21  3419     History  Chief Complaint  Patient presents with   Chest Pain    Thomas Spears is a 46 y.o. male with a past medical history of CAD, heart failure, and aortic aneurysm presenting today with chest pain.  He was seen yesterday by family medicine and sent to the ED for chest pain that started on Sunday.  In the ED he had a dissection study suspicious for dissection and then a gated CT study that was pending prior to patient leaving the department.  Returns today with continued symptoms.   Chest Pain      Home Medications Prior to Admission medications   Medication Sig Start Date End Date Taking? Authorizing Provider  acetaminophen (TYLENOL) 500 MG tablet Take 2 tablets (1,000 mg total) by mouth every 6 (six) hours as needed (pain). Patient taking differently: Take 1,000 mg by mouth every 6 (six) hours as needed for moderate pain (pain). 03/20/21   Lattie Haw, MD  aspirin EC 81 MG tablet Take 1 tablet (81 mg total) by mouth every evening. Swallow whole. 03/20/21   Lattie Haw, MD  atorvastatin (LIPITOR) 40 MG tablet TAKE 1 TABLET(40 MG) BY MOUTH DAILY Patient taking differently: Take 40 mg by mouth every evening. 07/17/21   Freada Bergeron, MD  benzonatate (TESSALON) 100 MG capsule Take 1 capsule (100 mg total) by mouth every 8 (eight) hours. Patient not taking: Reported on 09/04/2021 04/12/21   Raspet, Junie Panning K, PA-C  carvedilol (COREG) 25 MG tablet Take 1.5 tablets (37.5 mg total) by mouth 2 (two) times daily. 12/05/20   Freada Bergeron, MD  dapagliflozin propanediol (FARXIGA) 10 MG TABS tablet Take 10 mg by mouth daily.    [provider]  hydrALAZINE (APRESOLINE) 25 MG tablet Take 1 tablet (25 mg total) by mouth 3 (three) times daily. 04/14/21   Freada Bergeron, MD  isosorbide mononitrate (IMDUR) 30 MG 24 hr tablet Take 0.5 tablets (15  mg total) by mouth daily. 04/14/21   Freada Bergeron, MD  sacubitril-valsartan (ENTRESTO) 97-103 MG Take 1 tablet by mouth 2 (two) times daily. 05/08/21   Freada Bergeron, MD  spironolactone (ALDACTONE) 25 MG tablet Take 1 tablet (25 mg total) by mouth daily. 02/16/21   Freada Bergeron, MD      Allergies    Mushroom extract complex    Review of Systems   Review of Systems  Cardiovascular:  Positive for chest pain.    Physical Exam Updated Vital Signs BP (!) 161/100   Pulse (!) 53   Temp 97.6 F (36.4 C) (Oral)   Resp 17   Ht '5\' 9"'$  (1.753 m)   Wt 102.5 kg   SpO2 98%   BMI 33.37 kg/m  Physical Exam Vitals and nursing note reviewed.  Constitutional:      Appearance: Normal appearance.  HENT:     Head: Normocephalic and atraumatic.  Eyes:     General: No scleral icterus.    Conjunctiva/sclera: Conjunctivae normal.  Cardiovascular:     Rate and Rhythm: Normal rate and regular rhythm.     Heart sounds: Murmur heard.  Pulmonary:     Effort: Pulmonary effort is normal. No tachypnea, accessory muscle usage or respiratory distress.     Breath sounds: No decreased breath sounds.  Skin:    Findings: No rash.  Neurological:     Mental Status:  He is alert.  Psychiatric:        Mood and Affect: Mood normal.     ED Results / Procedures / Treatments   Labs (all labs ordered are listed, but only abnormal results are displayed) Labs Reviewed  COMPREHENSIVE METABOLIC PANEL - Abnormal; Notable for the following components:      Result Value   Glucose, Bld 117 (*)    Creatinine, Ser 1.40 (*)    Albumin 3.1 (*)    All other components within normal limits  TROPONIN I (HIGH SENSITIVITY) - Abnormal; Notable for the following components:   Troponin I (High Sensitivity) 46 (*)    All other components within normal limits  CBC  D-DIMER, QUANTITATIVE  TROPONIN I (HIGH SENSITIVITY)    EKG None  Radiology DG Chest 2 View  Result Date: 09/06/2021 CLINICAL DATA:   Chest pain.  Chest tightness. EXAM: CHEST - 2 VIEW COMPARISON:  Chest XR, 08/05/2021.  CT chest, 08/05/2021. FINDINGS: Cardiomediastinal silhouette is within normal limits. Lungs are well inflated. No focal consolidation or mass. No pleural effusion or pneumothorax. No acute displaced fracture. IMPRESSION: No acute cardiopulmonary process. Electronically Signed   By: Michaelle Birks M.D.   On: 09/06/2021 08:01   CT ANGIO CHEST AORTA W/ & OR WO/CM & GATING ([facility] ONLY)  Result Date: 09/04/2021 CLINICAL DATA:  Thoracic aortic dissection, follow up.  Chest pain EXAM: CT ANGIOGRAPHY CHEST WITH CONTRAST TECHNIQUE: Multidetector CT imaging of the chest was performed using the standard protocol during bolus administration of intravenous contrast. Multiplanar CT image reconstructions and MIPs were obtained to evaluate the vascular anatomy. RADIATION DOSE REDUCTION: This exam was performed according to the departmental dose-optimization program which includes automated exposure control, adjustment of the mA and/or kV according to patient size and/or use of iterative reconstruction technique. CONTRAST:  126m OMNIPAQUE IOHEXOL 350 MG/ML SOLN COMPARISON:  09/04/2021, 05/22/2020 FINDINGS: Cardiovascular: Concern for small pulmonary embolus in the right upper lobe seen on image one hundred forty-four of series 8. 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 Ascending thoracic aorta measures 4.1 cm on coronal image 68 compared to 4.1 cm previously. Previously seen linear filling defect in the aortic root is again noted. Again, focal dissection cannot be completely excluded. This is unchanged since prior study and dating back to 05/22/2020. Scattered calcifications in the coronary arteries. Mediastinum/Nodes: No mediastinal, hilar, or axillary adenopathy. Trachea and esophagus are unremarkable. Thyroid unremarkable. Lungs/Pleura: No  confluent opacities or effusions. Upper Abdomen: No acute findings Musculoskeletal: Chest wall soft tissues are unremarkable. No acute bony abnormality. Review of the MIP images confirms the above findings. IMPRESSION: Small filling defect suspected in a right upper lobe pulmonary arterial branch concerning for small right upper lobe pulmonary embolus. Stable aneurysmal dilatation of the aortic root and ascending thoracic aorta measuring maximally 4.9 cm at the sinuses of Valsalva. Linear filling defect is again noted in the aortic root, similar to prior studies dating back to 05/22/2020. This is of unknown etiology. Again, this could reflect a focal dissection but is unchanged. Scattered coronary artery calcifications. Electronically Signed   By: KRolm BaptiseM.D.   On: 09/04/2021 23:32   CT Angio Chest/Abd/Pel for Dissection W and/or Wo Contrast  Result Date: 09/04/2021 CLINICAL DATA:  Chest pain since 4:45 a.m., history of known aortic aneurysm in a 46year old male. EXAM: CT ANGIOGRAPHY CHEST, ABDOMEN AND PELVIS TECHNIQUE: Non-contrast CT of  the chest was initially obtained. Multidetector CT imaging through the chest, abdomen and pelvis was performed using the standard protocol during bolus administration of intravenous contrast. Multiplanar reconstructed images and MIPs were obtained and reviewed to evaluate the vascular anatomy. RADIATION DOSE REDUCTION: This exam was performed according to the departmental dose-optimization program which includes automated exposure control, adjustment of the mA and/or kV according to patient size and/or use of iterative reconstruction technique. CONTRAST:  100 mL Omnipaque 350 COMPARISON:  June 01, 2021. FINDINGS: CTA CHEST FINDINGS Cardiovascular: Noncontrast appearance of the thoracic aorta without signs of intramural hematoma. No hemopericardium. Calcified coronary artery disease. Aorta with standard three-vessel branching pattern. No signs of acute aortic process.  Redemonstration of sinus of Valsalva aneurysm. Ascending thoracic aortic caliber at 4.1 cm in the coronal plane within 1-2 mm of previous measurement. Aortic sinus dilated to 4.8 cm also measured in the coronal plane unchanged as far back as May 22, 2020. No stranding adjacent to the thoracic aorta. Just above the LEFT coronary cusp is an area of irregularity with focal potential contour bulging along the medial wall of the thoracic aorta best seen on image 63/9. The possibility of small dissection flap is illustrated on this image. There is evidence of motion artifact but this is above this level on this non gated assessment. Pulmonary vasculature with normal appearance. No signs of pulmonary embolism. Heart size moderately enlarged to markedly enlarged as before. Mediastinum/Nodes: Esophagus is normal. No signs of adenopathy. Lungs/Pleura: No effusion. No consolidative changes. No suspicious nodules. Airways are patent. Musculoskeletal: See below for full musculoskeletal details. Review of the MIP images confirms the above findings. CTA ABDOMEN AND PELVIS FINDINGS VASCULAR Aorta: 2.9 cm infrarenal abdominal aortic aneurysm just above the iliac bifurcation. Celiac: Patent without evidence of aneurysm, dissection, vasculitis or significant stenosis. SMA: Origin is widely patent. There is some motion related artifact and beam hardening artifact leading to some distortion of the vessel best appreciated in the coronal plane. No surrounding stranding. There is some soft plaque in the vessel lumen. There may be mild proximal dilation of the vessel. Renals: Single renal arteries bilaterally. Atherosclerotic plaque in the RIGHT mid renal artery with moderate narrowing of the vessel in the midportion of the vessel. IMA: Mild narrowing at the origin, patent distally with good opacification and no aneurysmal dilation. Inflow: Patent without evidence of aneurysm, dissection, vasculitis or significant stenosis. Veins: No  obvious venous abnormality within the limitations of this arterial phase study. Outflow: Patent outflow into the upper thigh. Review of the MIP images confirms the above findings. NON-VASCULAR Hepatobiliary: Normal contour and size. No pericholecystic stranding or biliary duct dilation. Pancreas: Normal, without mass, inflammation or ductal dilatation. Spleen: Normal. Adrenals/Urinary Tract: Adrenal glands are normal. Symmetric renal enhancement without hydronephrosis. Forty-two Hounsfield unit lesion arises from the upper pole the LEFT kidney posteriorly measuring 2.9 x 2.5 cm this is new compared to March of 2017 and measured 41 Hounsfield units on the noncontrast study from June 21, 2021. Minimal surrounding stranding of this lesion is unchanged. Urinary bladder is collapsed without adjacent stranding. No suspicious renal lesions on the RIGHT. Stomach/Bowel: Normal appendix. Stomach without adjacent stranding. Small bowel is of normal caliber. Appendix is normal. Colon without signs of adjacent stranding. Lymphatic: No adenopathy. Reproductive: Unremarkable by CT. Other: No ascites. Musculoskeletal: Spinal degenerative changes without acute or destructive bone finding. Review of the MIP images confirms the above findings. IMPRESSION: 1. Stable dilation of the aortic sinus approximately 4.8 cm with stable  mild aneurysm of the ascending thoracic aorta. 2. Potential small dissection flap just above the LEFT coronary cusp. This is below an area that is clearly affected by motion on this non gated assessment but cannot be clearly attributed to motion at the level of the abnormality. Motion artifact is still a possibility on this non gated scan. Would suggest cardiothoracic consult and follow-up gated CT angiography of the chest. 3. Cardiomegaly. 4. 2.9 cm infrarenal abdominal aortic aneurysm. Recommend follow-up every 5 years. Reference: J Am Coll Radiol 1950;93:267-124. 5. Moderate RIGHT renal artery stenosis. 6.  Findings suspicious for solid renal neoplasm. Follow-up CT renal protocol or MRI with and without contrast is suggested when the patient is able, preferably as an outpatient outside of the acute setting. These results were called by telephone at the time of interpretation on 09/04/2021 at 6:53 pm to provider Dr. Roslynn Amble, who verbally acknowledged these results. Electronically Signed   By: Zetta Bills M.D.   On: 09/04/2021 18:58    Procedures Procedures   Medications Ordered in ED Medications - No data to display  ED Course/ Medical Decision Making/ A&P                           Medical Decision Making Amount and/or Complexity of Data Reviewed Labs: ordered. Radiology: ordered.   This patient presents to the ED for concern of pleuritic chest pain.  Differential includes but is not limited to PE, ACS, dissection and pneumonia.   This is not an exhaustive differential.    Past Medical History / Co-morbidities / Social History: Heavy smoker, history of AAA   Additional history: Per chart review, patient was seen Monday for the same thing.  Dr. Prescott Gum was consulted at the time about patient's CTA dissection study that showed a small dissection flap just above the coronary cusp.  He recommended a CT chest gated study.  This was ordered and pending however patient left prior to results.  Results show a linear filling defect in the aortic root however this appears unchanged from March 2022 when it was noted on a CT PE scan.  Also showed suspicion for PE in the right upper lung lobe  He had a CT chest in April that showed the AAA and a small pulmonary nodule in the right upper lung.  He has followed with Dr. Orvan Seen with CT surgery for the known AAA.   Physical Exam: Mildly bradycardic, lung sounds clear.  Lab Tests: I ordered, and personally interpreted labs.  The pertinent results include:  -Troponin mildly elevated to 46 -Negative D-dimer   Imaging Studies: I ordered and  independently visualized and interpreted patient's chest x-ray which showed no acute abnormalities.  I agree with the radiologist interpretation.  Consultations Obtained: I discussed lab and imaging findings with Dr. Roxan Hockey with CT surgery.  He agrees to consult and recommends a repeat gated scan.  Disposition: 46 year old male presenting with continued chest pain.  Started on Sunday.  Localizes to the middle of his chest, describes it as pleuritic.  This has been ongoing and never got better from his visit on Monday.  Low suspicion PE although it was seen on his previous scan.  It appears to be in the same area as his previously noted pulmonary nodule, and he has had a negative D-dimer today.  Larger concern for his symptoms being secondary to aortic dissection.  On Monday the dissection appeared to be questionably chronic however after speaking  with Dr. Roxan Hockey with CT surgery, scan will be repeated.  If the scan is stable or improved he will likely be stable for discharge home with outpatient follow-up.  If findings are abnormal, patient will likely be admitted with further CT surgery evaluation.  At this time patient's scan is pending.  He has been signed out to my attending Dr. Eulis Foster will follow-up on the scan and decide ultimate disposition for this patient.    Darliss Ridgel 09/06/21 1812    Daleen Bo, MD 09/06/21 2239

## 2021-09-06 NOTE — ED Triage Notes (Signed)
Chest pain since Monday was seen on Monday for same and discharged.  Patient reports pain continues in chest described as tightness.  +SOB  Hx of stroke HTN, states "has an aneurysm on heart and pulm embolism."

## 2021-09-06 NOTE — Consult Note (Addendum)
Reason for Consult:Ascending aneurysm Referring Physician: ED  Thomas Spears is an 46 y.o. male.  HPI: 46 yo man with a history of ascending aneurysm, hypertension, hyperlipidemia, moderate AI and mild to moderate MR, and tobacco use.  Found to have an ascending aneurysm with a possible limited dissection in 2022. Was seen by Dr. Orvan Seen at that time, and has been followed since then.   Presented to ED 2 days ago with left sided chest tightness.  Describes discomfort as tightness, not a sharp or burning pain.  No significant shortness of breath. He had a CT angio of chest and then a gated CT.  Showed no change in ascending aneurysm.  Possible right sided PE on gated study.  His pain did not resolve but he became inpatient and left AMA.  Pain persisted yesterday and waxed and waned to some degree but never totally resolved.  Today still had the tightness, still about the same.  He mentioned it to his wife and she persuaded him to come to ED.  Past Medical History:  Diagnosis Date   Aortic regurgitation    07/2020 cardiac MRI moderate aortic regurgitation   Ascending aorta dilation (HCC)    05/01/20 CTA 4.7 cm. 08/19/20 cardiac stress MRI 43m.   Chronic combined systolic and diastolic CHF (congestive heart failure) ([facility]    Community acquired pneumonia    HLD (hyperlipidemia)    Hypertension    Hypertensive crisis 05/22/2020   Left ventricular hypertrophy    Mitral regurgitation    07/2020 cardiac MRI mild ot moderate MR   Tension headache 06/21/2020   Tobacco use     Past Surgical History:  Procedure Laterality Date   CAROTID STENT INSERTION     KNEE SURGERY Left    LEFT HEART CATH AND CORONARY ANGIOGRAPHY N/A 08/31/2020   Procedure: LEFT HEART CATH AND CORONARY ANGIOGRAPHY;  Surgeon: HLeonie Man MD;  [facility];  Service: Cardiovascular;  Laterality: N/A;   TONSILLECTOMY      History reviewed. No pertinent family history.  Social History:  reports that  he has been smoking. He has never used smokeless tobacco. He reports that he does not currently use alcohol. No history on file for drug use.  Allergies:  Allergies  Allergen Reactions   Mushroom Extract Complex Anaphylaxis    Medications:  acetaminophen (TYLENOL) 500 MG tablet Take 2 tablets (1,000 mg total) by mouth every 6 (six) hours as needed (pain). Patient taking differently: Take 1,000 mg by mouth every 6 (six) hours as needed for moderate pain (pain). 03/20/21     PLattie Haw MD  aspirin EC 81 MG tablet Take 1 tablet (81 mg total) by mouth every evening. Swallow whole. 03/20/21     PLattie Haw MD  atorvastatin (LIPITOR) 40 MG tablet TAKE 1 TABLET(40 MG) BY MOUTH DAILY Patient taking differently: Take 40 mg by mouth every evening. 07/17/21     PFreada Bergeron MD  benzonatate (TESSALON) 100 MG capsule Take 1 capsule (100 mg total) by mouth every 8 (eight) hours. Patient not taking: Reported on 09/04/2021 04/12/21     Raspet, EJunie PanningK, PA-C  carvedilol (COREG) 25 MG tablet Take 1.5 tablets (37.5 mg total) by mouth 2 (two) times daily. 12/05/20     PFreada Bergeron MD  dapagliflozin propanediol (FARXIGA) 10 MG TABS tablet Take 10 mg by mouth daily.       [provider]  hydrALAZINE (APRESOLINE) 25 MG tablet Take 1 tablet (25 mg  total) by mouth 3 (three) times daily. 04/14/21     Freada Bergeron, MD  isosorbide mononitrate (IMDUR) 30 MG 24 hr tablet Take 0.5 tablets (15 mg total) by mouth daily. 04/14/21     Freada Bergeron, MD  sacubitril-valsartan (ENTRESTO) 97-103 MG Take 1 tablet by mouth 2 (two) times daily. 05/08/21     Freada Bergeron, MD  spironolactone (ALDACTONE) 25 MG tablet Take 1 tablet (25 mg total) by mouth daily. 02/16/21     Freada Bergeron, MD     Results for orders placed or performed during the hospital encounter of 09/06/21 (from the past 48 hour(s))  CBC     Status: None   Collection Time: 09/06/21  7:43 AM  Result Value Ref  Range   WBC 8.9 4.0 - 10.5 K/uL   RBC 4.89 4.22 - 5.81 MIL/uL   Hemoglobin 14.6 13.0 - 17.0 g/dL   HCT 43.2 39.0 - 52.0 %   MCV 88.3 80.0 - 100.0 fL   MCH 29.9 26.0 - 34.0 pg   MCHC 33.8 30.0 - 36.0 g/dL   RDW 13.3 11.5 - 15.5 %   Platelets 255 150 - 400 K/uL   nRBC 0.0 0.0 - 0.2 %    Comment: Performed at [facility], [place] [address] [place], [place] [address]  Troponin I (High Sensitivity)     Status: Abnormal   Collection Time: 09/06/21  7:43 AM  Result Value Ref Range   Troponin I (High Sensitivity) 46 (H) <18 ng/L    Comment: (NOTE) Elevated high sensitivity troponin I (hsTnI) values and significant  changes across serial measurements may suggest ACS but many other  chronic and acute conditions are known to elevate hsTnI results.  Refer to the "Links" section for chest pain algorithms and additional  guidance. Performed at [facility], [place] [address] [place], [place] [address]   D-dimer, quantitative     Status: None   Collection Time: 09/06/21  7:43 AM  Result Value Ref Range   D-Dimer, Quant 0.32 0.00 - 0.50 ug/mL-FEU    Comment: (NOTE) At the manufacturer cut-off value of 0.5 g/mL FEU, this assay has a negative predictive value of 95-100%.This assay is intended for use in conjunction with a clinical pretest probability (PTP) assessment model to exclude pulmonary embolism (PE) and deep venous thrombosis (DVT) in outpatients suspected of PE or DVT. Results should be correlated with clinical presentation. Performed at [facility], [place] [address], [place], [place] [address]   Comprehensive metabolic panel     Status: Abnormal   Collection Time: 09/06/21  7:43 AM  Result Value Ref Range   Sodium 140 135 - 145 mmol/L   Potassium 4.1 3.5 - 5.1 mmol/L   Chloride 108 98 - 111 mmol/L   CO2 25 22 - 32 mmol/L   Glucose, Bld 117 (H) 70 - 99 mg/dL    Comment: Glucose reference range applies only to samples taken after fasting for at least 8 hours.    BUN 13 6 - 20 mg/dL   Creatinine, Ser 1.40 (H) 0.61 - 1.24 mg/dL   Calcium 9.1 8.9 - 10.3 mg/dL   Total Protein 6.5 6.5 - 8.1 g/dL   Albumin 3.1 (L) 3.5 - 5.0 g/dL   AST 35 15 - 41 U/L   ALT 30 0 - 44 U/L   Alkaline Phosphatase 66 38 - 126 U/L   Total Bilirubin 0.9 0.3 - 1.2 mg/dL  GFR, Estimated >60 >60 mL/min    Comment: (NOTE) Calculated using the CKD-EPI Creatinine Equation (2021)    Anion gap 7 5 - 15    Comment: Performed at [facility], [place] [address], [place], [place] [address]  Troponin I (High Sensitivity)     Status: Abnormal   Collection Time: 09/06/21  4:16 PM  Result Value Ref Range   Troponin I (High Sensitivity) 37 (H) <18 ng/L    Comment: (NOTE) Elevated high sensitivity troponin I (hsTnI) values and significant  changes across serial measurements may suggest ACS but many other  chronic and acute conditions are known to elevate hsTnI results.  Refer to the "Links" section for chest pain algorithms and additional  guidance. Performed at [facility], [place] [address] [place], [place] [address]     DG Chest 2 View  Result Date: 09/06/2021 CLINICAL DATA:  Chest pain.  Chest tightness. EXAM: CHEST - 2 VIEW COMPARISON:  Chest XR, 08/05/2021.  CT chest, 08/05/2021. FINDINGS: Cardiomediastinal silhouette is within normal limits. Lungs are well inflated. No focal consolidation or mass. No pleural effusion or pneumothorax. No acute displaced fracture. IMPRESSION: No acute cardiopulmonary process. Electronically Signed   By: Michaelle Birks M.D.   On: 09/06/2021 08:01   CT ANGIO CHEST AORTA W/ & OR WO/CM & GATING ( ONLY)  Result Date: 09/04/2021 CLINICAL DATA:  Thoracic aortic dissection, follow up.  Chest pain EXAM: CT ANGIOGRAPHY CHEST WITH CONTRAST TECHNIQUE: Multidetector CT imaging of the chest was performed using the standard protocol during bolus administration of intravenous contrast. Multiplanar CT image reconstructions and MIPs were obtained to  evaluate the vascular anatomy. RADIATION DOSE REDUCTION: This exam was performed according to the departmental dose-optimization program which includes automated exposure control, adjustment of the mA and/or kV according to patient size and/or use of iterative reconstruction technique. CONTRAST:  156m OMNIPAQUE IOHEXOL 350 MG/ML SOLN COMPARISON:  09/04/2021, 05/22/2020 FINDINGS: Cardiovascular: Concern for small pulmonary embolus in the right upper lobe seen on image one hundred forty-four of series 8. 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 Ascending thoracic aorta measures 4.1 cm on coronal image 68 compared to 4.1 cm previously. Previously seen linear filling defect in the aortic root is again noted. Again, focal dissection cannot be completely excluded. This is unchanged since prior study and dating back to 05/22/2020. Scattered calcifications in the coronary arteries. Mediastinum/Nodes: No mediastinal, hilar, or axillary adenopathy. Trachea and esophagus are unremarkable. Thyroid unremarkable. Lungs/Pleura: No confluent opacities or effusions. Upper Abdomen: No acute findings Musculoskeletal: Chest wall soft tissues are unremarkable. No acute bony abnormality. Review of the MIP images confirms the above findings. IMPRESSION: Small filling defect suspected in a right upper lobe pulmonary arterial branch concerning for small right upper lobe pulmonary embolus. Stable aneurysmal dilatation of the aortic root and ascending thoracic aorta measuring maximally 4.9 cm at the sinuses of Valsalva. Linear filling defect is again noted in the aortic root, similar to prior studies dating back to 05/22/2020. This is of unknown etiology. Again, this could reflect a focal dissection but is unchanged. Scattered coronary artery calcifications. Electronically Signed   By: KRolm BaptiseM.D.   On: 09/04/2021 23:32    Review of Systems   Constitutional:  Positive for activity change. Negative for fever and unexpected weight change.  Respiratory:  Positive for chest tightness (left sided). Negative for shortness of breath.  Cardiovascular:  Positive for chest pain (see tightness).  Gastrointestinal:  Negative for abdominal distention and abdominal pain.   Blood pressure 138/90, pulse (!) 56, temperature 97.6 F (36.4 C), temperature source Oral, resp. rate 17, height '5\' 9"'$  (1.753 m), weight 102.5 kg, SpO2 97 %. Physical Exam Vitals reviewed.  Constitutional:      General: He is not in acute distress.    Appearance: He is well-developed.     Comments: Resting comfortably with no outward signs of pain  HENT:     Head: Normocephalic and atraumatic.  Neck:     Trachea: No tracheal deviation.  Cardiovascular:     Rate and Rhythm: Normal rate and regular rhythm.     Pulses:          Carotid pulses are 2+ on the right side and 2+ on the left side.      Radial pulses are 2+ on the right side and 2+ on the left side.       Dorsalis pedis pulses are 2+ on the right side and 2+ on the left side.       Posterior tibial pulses are 2+ on the right side and 2+ on the left side.     Heart sounds: Murmur heard.     Systolic murmur is present with a grade of 2/6.  Pulmonary:     Effort: Pulmonary effort is normal.     Breath sounds: Normal breath sounds.  Abdominal:     Palpations: Abdomen is soft.  Musculoskeletal:     Cervical back: Neck supple.  Skin:    General: Skin is warm and dry.  Neurological:     General: No focal deficit present.     Mental Status: He is alert and oriented to person, place, and time.     Cranial Nerves: No cranial nerve deficit.     Motor: No weakness.     Assessment/Plan: Thomas Spears is a 46 yo man with a history of ascending aneurysm, hypertension, hyperlipidemia, moderate AI and mild to moderate MR, and tobacco use.  Presented 2 days ago with new onset chest tightness, left sided. He left AMA  before a disposition.  Now returns with persistent discomfort with some waxing and waning.  Difficult to get a good read on his pain, as he looks very relaxed and comfortable presently.  I strongly doubt this is due to his aneurysm, but also not convinced it is due to the questionable PE on his Ct form Monday either.  Since there is likely a limited chronic dissection, he needs to be reimaged to ensure no progression.  Will also allow for a relook at the possible PE.  Melrose Nakayama 09/06/2021, 5:50 PM   I reviewed CT. No PE and no acute aortic pathology, measurement reported are different from 7/10 but viewed side by side I see no difference in sie when measured in the same planes, also no different from 2022.  Pain syndrome not suggestive of aortic pathology.  Revonda Standard Roxan Hockey, MD [facility] [phone]

## 2021-09-06 NOTE — Discharge Instructions (Addendum)
  Continue to take your regular medications.  Follow-up with your heart surgeon,  next month on the 14th as planned.  Follow-up with your primary care doctor and cardiologist for routine management and discussion of your chronic problems.

## 2021-09-06 NOTE — ED Notes (Signed)
This RN spoke with CT tech at this time and CT tech reports that they are waiting for a  CT  table to open up prior to sending for the patinet but they will do so asap

## 2021-09-06 NOTE — ED Notes (Signed)
Patient transported to CT 

## 2021-09-06 NOTE — ED Provider Notes (Signed)
  Face-to-face evaluation   History: Patient presenting for reevaluation after evaluation 2 days ago for chest pain.  He left prior to receiving results of the second CT scan.  Physical exam: Alert, calm, cooperative.  No respiratory distress.  MDM: Evaluation for  Chief Complaint  Patient presents with   Chest Pain     Patient with a history of aortic aneurysm, presenting now with concern for ongoing symptoms, and did not complete discharge planning previously when seen.  In the ED is comfortable with reassuring vital signs, he was evaluated by CVTS.  Patient remained stable did not require hospitalization.   9:35 PM.  Patient comfortable.  Vital signs stable.  I discussed the repeat CT images with Dr. Roxan Hockey.  He feels the scan today is unchanged from 2 days ago.  There is no evidence for PE today.  Aortic aneurysm unchanged.  Patient has follow-up appointment planned with CVTS, in 1 month.  Patient was comfortable and wants to go home.  He has no further needs.  He plans on taking his nightly medicines when he gets home but requested a 81 mg aspirin to take here because he does not currently have any at home.  He is discharged in stable condition.  Medical screening examination/treatment/procedure(s) were conducted as a shared visit with non-physician practitioner(s) and myself.  I personally evaluated the patient during the encounter   CT angio today: IMPRESSION:  1. No pulmonary embolism.  2. Mild aneurysmal dilation of the thoracic aorta at the sinuses of  Valsalva, ascending aorta, and descending thoracic aorta. Maximal  diameter 5.7 cm at the sinuses of Valsalva. No evidence of  intramural hematoma or dissection.  3. 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  This was better assessed on MRI examination of 08/19/2020.  4. Moderate coronary artery calcification.      Electronically  Signed    By: Fidela Salisbury M.D.    On: 09/06/2021 21:08     Daleen Bo, MD 09/06/21 [id number]

## 2021-09-07 ENCOUNTER — Telehealth: Payer: Self-pay | Admitting: *Deleted

## 2021-09-07 NOTE — Telephone Encounter (Signed)
Secure chat received from Dr. Johney Frame to get this pt into her clinic to be seen sometime soon, for further evaluation of chest pain.  Attempted to call the pt but he did not answer.  Did leave the pt a detailed message to call the office back, so that we can arrange for him a follow-up appt with Dr. Johney Frame.   Will have scheduling dept try to reach out to the pt as well.

## 2021-09-08 NOTE — Telephone Encounter (Signed)
Left the pt a message to call the office back to assist in making him a follow-up appt with Dr. Johney Frame or an APP.  This is for follow-up of chest pain and recent hospital visit for chest pain.   2nd attempt at trying to make contact with the pt.

## 2021-09-08 NOTE — Telephone Encounter (Signed)
RE: PT NEEDS AN APPT WITH DR. Nonnie Done Received: Today [place], Lonna Cobb, LPN [name],   I left him a VM to callback to schedule appointment. Just wanted to let you know he has been contacted.

## 2021-09-09 ENCOUNTER — Encounter: Payer: Self-pay | Admitting: Cardiology

## 2021-09-11 ENCOUNTER — Encounter: Payer: Self-pay | Admitting: Cardiology

## 2021-09-11 NOTE — Telephone Encounter (Signed)
Left the pt another message to call the office back to arrange an appt with our office for sometime soon, for chest pain follow-up.

## 2021-09-12 ENCOUNTER — Other Ambulatory Visit: Payer: Self-pay

## 2021-09-12 MED ORDER — ENTRESTO 97-103 MG PO TABS: 1.0000 | ORAL_TABLET | Freq: Two times a day (BID) | ORAL | 0 refills | Status: DC

## 2021-09-12 NOTE — Telephone Encounter (Signed)
See mychart message with further details about Prior Auth Nurse assisting the pt with his [name] needs.  PA Nurse left pt assistance forms at the front desk for the pt to pick up at [place] on 7/24.  Note placed in appt notes about pt needing to pick these forms up.

## 2021-09-12 NOTE — Telephone Encounter (Signed)
Mychart message received from the pt about entresto enrollment and needing assistance with this.   Message sent back to the pt that he needs an appt with Dr. Johney Frame or an Extender, for recent hospital visit for chest pain.  Went ahead and made the pt an appt to see Dr. Johney Frame on next Monday 7/24 at 0930, and advised him to message back if this time doesn't work for him.  Also sent his concerns about entresto enrollment to our Prior [place], so that she can further assist him with this need.  Sent this to her via his original [employer].

## 2021-09-15 ENCOUNTER — Encounter: Payer: Self-pay | Admitting: Cardiology

## 2021-09-15 NOTE — H&P (View-Only) (Signed)
Cardiology Office Note:    Date:  09/18/2021   ID:  Thomas Spears, DOB 12-Dec-1975, MRN [id number]  PCP:  Ezequiel Essex, MD   [facility] Providers Cardiologist:  Freada Bergeron, MD {    Referring MD: Ezequiel Essex, MD    History of Present Illness:    Thomas Spears is a 46 y.o. male with a hx of systolic heart failure with LVEF 25% 04/2020 that improved to 60-65% in 02/2021, HCM on CMR on 07/2020, HTN, mild-to-moderate MR, severe aortic dilation measuring 61m who presents to clinic for follow-up.  [facility] hypertensive crisis where he presented with dyspnea and HA found to have Bps 220s/150s. CTA chest showed no PE but demonstrated thoracic aortic aneurysm 4.7cm as well as evidence of pulmonary HTN. He was placed on a nitro gtt. Trop 400>433. TTE 05/23/20 showed LVEF 25% with global hypokinesis, severe LVH, G2DD, mild RV dysfunction, severe LAE, moderate RAE, mild-to-moderate MR, trivial AI, severe aortic dilation, small pericardial effusion. Prior to completed work-up, the patient left AMA.   He was seen in clinic 07/23/2018 where he reported several years of not taking his medication and uncontrolled hypertension.  Also regularly drinking energy drinks, soda, smoking 2 packs/day.  He used to be a heavy drinker.  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  He was taking all of his blood pressure medications and feeling overall well.  He displayed NYHA I-II symptoms.  He was recommended for stress MRI to assess for ischemia and evidence of amyloidosis. [facility]ol.  He was referred to the hypertension clinic.   Seen by pharmacy team 08/09/20 and Carvedilol increased to 12.'5mg'$  QD.    He had cardiac stress MRI 08/19/20 showing basal to mid inferior stress perfusion defect consistent with ischemia.  He had moderate LV dilation with moderate systolic  dysfunction LVEF 34%.  Asymmetric LV hypertrophy measuring up to 39 mL basal septum differential including hypertrophic cardiomyopathy versus hypertensive heart disease.  No evidence of cardiac amyloidosis.  Normal RV size with mild systolic dysfunction, moderate aortic regurgitation, mild to moderate mitral regurgitation, ascending aortic aneurysm measuring 45 mm.Cardiac catheterization performed 7/07-2020 by Dr. HEllyn Hackshowing diffuse mild to moderate multivessel disease, no culprit lesion to explain reduced LVEF, no previously placed stent.   Seen by pharmacy team 09/06/20 and transitioned from indapamide to spironolactone. Seen 09/14/20 with EDelene Lollincreased to 49-'51mg'$  BID. 09/28/20 pharmacy increased Carvedilol to '25mg'$  BID and Spironolactone to '25mg'$  QD, Entresto increased to 97/'103mg'$ .    Seen by pharmacy team 12/02/20. He was continuing to decrease smoking. [facility] His Carvedilol was increased to 37.'5mg'$  QD as diastolic blood pressure still above goal. Transition from spironolactone to eplenerone was considered but deferred as he was without insurance.   Saw C25.$[id number]$11/2020 where he was doing well. BP 130-140s at that time. Echo on 03/20/21 showed improved LVEF to 60-65%.  Was last seen in clinic on 03/2021 where he was having atypical chest pain when laying flat at night but was otherwise doing well.   He has had 2 ER visits over the past month for chest pain. Trop 30-40s. He underwent CTA dissection study that showed possible small dissection flap above the coronary cusp. He was recommended for a gated CT chest that showed linear defect along the aortic root but this is unchanged and possible PE in  the RUL, but d-dimer was normal with greater suspicion that this was related to a pulmonary nodule. Dr. Roxan Hockey was consulted who recommended repeat CTA which was negative for any dissection or PE. He was discharged home.  Today, the  patient states he overall feels okay. He had an episode of chest tightness about 2 weeks ago for which he was seen in the ER as detailed above. Since that time, he has not had recurrence of the chest discomfort. His main complaints are intermittent headaches.  No SOB, LE edema, orthopnea, or PND. Has rare palpitations but no syncope. Has been compliant with all medications. Blood pressure is well controlled.    Past Medical History:  Diagnosis Date   Aortic regurgitation    07/2020 cardiac MRI moderate aortic regurgitation   Ascending aorta dilation (HCC)    05/01/20 CTA 4.7 cm. 08/19/20 cardiac stress MRI 37m.   Chronic combined systolic and diastolic CHF (congestive heart failure) ([place]    Community acquired pneumonia    HLD (hyperlipidemia)    Hypertension    Hypertensive crisis 05/22/2020   Left ventricular hypertrophy    Mitral regurgitation    07/2020 cardiac MRI mild ot moderate MR   Tension headache 06/21/2020   Tobacco use     Past Surgical History:  Procedure Laterality Date   CAROTID STENT INSERTION     KNEE SURGERY Left    LEFT HEART CATH AND CORONARY ANGIOGRAPHY N/A 08/31/2020   Procedure: LEFT HEART CATH AND CORONARY ANGIOGRAPHY;  Surgeon: HLeonie Man MD;  [facility];  Service: Cardiovascular;  Laterality: N/A;   TONSILLECTOMY      Current Medications: Current Meds  Medication Sig   acetaminophen (TYLENOL) 500 MG tablet Take 2 tablets (1,000 mg total) by mouth every 6 (six) hours as needed (pain). (Patient taking differently: Take 1,000 mg by mouth every 6 (six) hours as needed for moderate pain (pain).)   aspirin EC 81 MG tablet Take 1 tablet (81 mg total) by mouth every evening. Swallow whole.   atorvastatin (LIPITOR) 40 MG tablet TAKE 1 TABLET(40 MG) BY MOUTH DAILY (Patient taking differently: Take 40 mg by mouth every evening.)   hydrALAZINE (APRESOLINE) 25 MG tablet Take 1 tablet (25 mg total) by mouth 3 (three) times daily.   isosorbide  mononitrate (IMDUR) 30 MG 24 hr tablet Take 0.5 tablets (15 mg total) by mouth daily.   spironolactone (ALDACTONE) 25 MG tablet Take 1 tablet (25 mg total) by mouth daily.   [DISCONTINUED] carvedilol (COREG) 25 MG tablet Take 1.5 tablets (37.5 mg total) by mouth 2 (two) times daily.   [DISCONTINUED] dapagliflozin propanediol (FARXIGA) 10 MG TABS tablet Take 10 mg by mouth daily.   [DISCONTINUED] sacubitril-valsartan (ENTRESTO) 97-103 MG Take 1 tablet by mouth 2 (two) times daily.     Allergies:   Mushroom extract complex   Social History   Socioeconomic History   Marital status: Single    Spouse name: Not on file   Number of children: Not on file   Years of education: Not on file   Highest education level: Not on file  Occupational History   Not on file  Tobacco Use   Smoking status: Light Smoker   Smokeless tobacco: Never  Substance and Sexual Activity   Alcohol use: Not Currently   Drug use: Not on file   Sexual activity: Yes  Other Topics Concern   Not on file  Social History Narrative   Not on file  Social Determinants of Health   Financial Resource Strain: Not on file  Food Insecurity: Not on file  Transportation Needs: Not on file  Physical Activity: Not on file  Stress: Not on file  Social Connections: Not on file     Family History: The patient's family history is not on file.  ROS:   Please see the history of present illness.    Review of Systems  Constitutional:  Negative for fever and weight loss.  HENT:  Negative for nosebleeds.   Eyes:  Negative for photophobia and pain.  Respiratory:  Negative for cough, hemoptysis and wheezing.   Cardiovascular:  Positive for chest pain and palpitations. Negative for orthopnea, claudication, leg swelling and PND.  Gastrointestinal:  Negative for abdominal pain and vomiting.  Genitourinary:  Negative for frequency and urgency.  Musculoskeletal:  Negative for falls.  Neurological:  Positive for headaches. Negative  for tingling and seizures.     EKGs/Labs/Other Studies Reviewed:    The following studies were reviewed today: CTA chest 08/2021: FINDINGS: Cardiovascular: There is preferential opacification of the thoracic aorta. The aortic valve is trileaflet. The thoracic aorta is normal in course and caliber without evidence of intramural hematoma or dissection. The thoracic aorta is mildly dilated measuring 5.7 by 5.7 cm at the sinuses of Valsalva (image # 81/14 and 89/9). The ascending aorta is mildly dilated measuring 4.1 x 4.2 cm (image # 81/14 and 107/15). The descending thoracic aorta is mildly dilated measuring 3.2 x 3.0 cm just beyond the takeoff of the left subclavian artery (image # 123/14 and 132/15). No significant atherosclerotic plaque identified. The arch vasculature is widely patent proximally and demonstrates normal anatomic configuration.   Global cardiac size is within normal limits, however, there is left ventricular hypertrophy again noted with asymmetric septal hypertrophy noted. This results in marked narrowing of the left ventricular outflow tract with a cross-sectional area of the a outflow tract approximately 7 mm x 25 mm best seen on coronal reformat # 87 and sagittal reformat # 123. This was better assessed on MRI examination of 08/19/2020. Moderate coronary artery calcification. No pericardial effusion. Central pulmonary arteries are of normal caliber. There is adequate opacification of the pulmonary arterial tree and no intraluminal filling defect is identified through the segmental level to suggest acute pulmonary embolism.   Mediastinum/Nodes: No enlarged mediastinal, hilar, or axillary lymph nodes. Thyroid gland, trachea, and esophagus demonstrate no significant findings.   Lungs/Pleura: Lungs are clear. No pleural effusion or pneumothorax.   Upper Abdomen: No acute abnormality.   Musculoskeletal: No chest wall abnormality. No acute or  significant osseous findings.   Review of the MIP images confirms the above findings.   IMPRESSION: 1. No pulmonary embolism. 2. Mild aneurysmal dilation of the thoracic aorta at the sinuses of Valsalva, ascending aorta, and descending thoracic aorta. Maximal diameter 5.7 cm at the sinuses of Valsalva. No evidence of intramural hematoma or dissection. 3. 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 This was better assessed on MRI examination of 08/19/2020. 4. Moderate coronary artery calcification. TTE 03/20/21: IMPRESSIONS     1. No obstructive gradient. Left ventricular ejection fraction, by  estimation, is 60 to 65%. The left ventricle has normal function. The left  ventricle has no regional wall motion abnormalities. There is severe  concentric left ventricular hypertrophy.  Left ventricular diastolic parameters are consistent with Grade II  diastolic dysfunction (pseudonormalization).   2. Right ventricular systolic function  is normal. The right ventricular  size is normal.   3. Left atrial size was moderately dilated.   4. The mitral valve is normal in structure. Mild mitral valve  regurgitation. No evidence of mitral stenosis.   5. The aortic valve is normal in structure. Aortic valve regurgitation is  mild. No aortic stenosis is present.   6. Aortic dilatation noted. There is moderate dilatation of the aortic  root, measuring 48 mm. There is moderate dilatation of the ascending  aorta, measuring 45 mm.   7. The inferior vena cava is normal in size with greater than 50%  respiratory variability, suggesting right atrial pressure of 3 mmHg.   Comparison(s): The left ventricular function has improved. EF 25%, severe  LVH, AOV 58m, small pericardial effusion.   Conclusion(s)/Recommendation(s): Findings consistent with hypertrophic  cardiomyopathy. Prior Cardiac MRI has been  performed.   Echo 03/20/2021:  1. No obstructive gradient. Left ventricular ejection fraction, by  estimation, is 60 to 65%. The left ventricle has normal function. The left  ventricle has no regional wall motion abnormalities. There is severe  concentric left ventricular hypertrophy.  Left ventricular diastolic parameters are consistent with Grade II  diastolic dysfunction (pseudonormalization).   2. Right ventricular systolic function is normal. The right ventricular  size is normal.   3. Left atrial size was moderately dilated.   4. The mitral valve is normal in structure. Mild mitral valve  regurgitation. No evidence of mitral stenosis.   5. The aortic valve is normal in structure. Aortic valve regurgitation is  mild. No aortic stenosis is present.   6. Aortic dilatation noted. There is moderate dilatation of the aortic  root, measuring 48 mm. There is moderate dilatation of the ascending  aorta, measuring 45 mm.   7. The inferior vena cava is normal in size with greater than 50%  respiratory variability, suggesting right atrial pressure of 3 mmHg.   Comparison(s): The left ventricular function has improved. EF 25%, severe  LVH, AOV 511m small pericardial effusion.   Conclusion(s)/Recommendation(s): Findings consistent with hypertrophic  cardiomyopathy. Prior Cardiac MRI has been performed.   CT Chest 12/21/2020: COMPARISON:  CT angiography of the chest of March of 2022.   FINDINGS: Cardiovascular: Ascending aorta of 4.7 cm in transverse dimension stable compared to previous imaging. Descending thoracic aorta 3.4 cm also unchanged. Coronal dimension of the ascending thoracic aorta approximately 4.3 cm stable when measured in a similar fashion as well on the previous imaging study. The heart size remains enlarged. Coronary artery calcification of LEFT coronary circulation similar to the prior study. No substantial pericardial effusion. Normal caliber of central pulmonary  vessels. Limited assessment of cardiovascular structures given lack of intravenous contrast.   Mediastinum/Nodes: No thoracic inlet, axillary, mediastinal or hilar adenopathy. Esophagus grossly normal.   Lungs/Pleura: Lungs are clear. Airways are patent. Mild paraseptal emphysema at the lung apices.   Upper Abdomen: No acute abnormality.   Musculoskeletal: No chest wall mass or suspicious bone lesions identified.   IMPRESSION: Stable aneurysmal dilation of the ascending thoracic aorta measuring up to 4.7 cm in transverse dimension. 4.3 cm in greatest coronal dimension unchanged from previous imaging. Ascending thoracic aortic aneurysm. Recommend semi-annual imaging followup by CTA or MRA and referral to cardiothoracic surgery if not already obtained. This recommendation follows 2010 ACCF/AHA/AATS/ACR/ASA/SCA/SCAI/SIR/STS/SVM Guidelines for the Diagnosis and Management of Patients With Thoracic Aortic Disease. Circulation. 2010; 121: : I951-O841Aortic aneurysm NOS (ICD10-I71.9)   Stable cardiomegaly and coronary artery calcification.   Mild paraseptal  emphysema at the lung apices.   Aortic Atherosclerosis (ICD10-I70.0) and Emphysema (ICD10-J43.9).  Monitor 09/20/2020: Patch wear time was 8 days and 12 hours. Predominant rhythm was NSR with average HR 79bpm (ranging from 34-169bpm). There were 3 runs of nonsustained VT with longest/fastest lasting 8 beats at a rate of 169bpm Rare SVE (<1%), rare VE (<1%) Mobitz type I block seen during sleep Patient triggered events correlated with NSR No Afib, sustained arrhythmias or significant pauses     Patch Wear Time:  8 days and 12 hours (2022-07-11T21:01:27-0400 to 2022-07-20T09:54:00-0400)   Patient had a min HR of 34 bpm, max HR of 169 bpm, and avg HR of 79 bpm. Predominant underlying rhythm was Sinus Rhythm. First Degree AV Block was present. 3 Ventricular Tachycardia runs occurred, the run with the fastest interval lasting 8 beats  with a  max rate of 169 bpm (avg 130 bpm); the run with the fastest interval was also the longest. Second Degree AV Block-Mobitz I (Wenckebach) was present. Isolated SVEs were rare (<1.0%), and no SVE Couplets or SVE Triplets were present. Isolated VEs were rare  (<1.0%, 170), VE Couplets were rare (<1.0%, 12), and VE Triplets were rare (<1.0%, 6). Ventricular Trigeminy was present.   LHC [birth year] LV end diastolic pressure is moderately elevated. There is no aortic valve stenosis. Mid LM to Dist LM lesion is 20% stenosed. Ost LAD to Prox LAD lesion is 30% stenosed. Mid LAD lesion is 45% stenosed @ 1st Diag (40% pre & 50% post 1st Diag) Prox RCA lesion is 30% stenosed. Dist RCA lesion is 60% stenosed - discrete focal lesion, not flow limiting. No evidence of stent.   SUMMARY Diffuse mild to moderate multivessel disease but no obvious culprit lesion to explain significant reduced EF. Unable to visualize any previously placed stents. Moderately elevated LVEDP     RECOMMENDATIONS Continue to titrate GDMT for nonischemic CM. ->  Anticipate titrating up carvedilol to 12.5 mg twice daily and further titration of Entresto. May also consider spironolactone  Cardiac MRI Stress 08/19/20 IMPRESSION: 1. Basal to mid inferior stress perfusion defect consistent with ischemia   2. Moderate LV dilatation with moderate systolic dysfunction (EF 76%)   3. Asymmetric LV hypertrophy measuring up to 46m in basal septum (145min posterior wall). Differential includes hypertrophic cardiomyopathy vs hypertensive heart disease given history of severe uncontrolled hypertension. No evidence of cardiac amyloidosis. Patchy midwall LGE, which can be seen in either HCM or hypertensive heart disease. Suspect component of hypertensive heart disease, but also with likely HCM given extent of hypertrophy and significant amount of LGE, accounting for 12% of total myocardial mass   4.  Normal RV size with mild  systolic dysfunction (EF 4216%  5.  Moderate aortic regurgitation (regurgitant fraction 27%)   6.  Mild to moderate mitral regurgitation (regurgitant fraction 21%)   7.  Ascending aortic aneurysm measuring 4557m TTE 05/23/20: IMPRESSIONS   1. Left ventricular ejection fraction, by estimation, is 25%. The left  ventricle has severely decreased function. The left ventricle demonstrates  global hypokinesis. The left ventricular internal cavity size was mildly  dilated. There is severe left  ventricular hypertrophy raising concern cardiac amyloidosis versus  long-standing severe hypertension. Left ventricular diastolic parameters  are consistent with Grade II diastolic dysfunction (pseudonormalization).   2. Right ventricular systolic function is mildly reduced. The right  ventricular size is normal.   3. Left atrial size was severely dilated.   4. Right atrial size was moderately  dilated.   5. The mitral valve is normal in structure. Mild to moderate mitral valve  regurgitation. No evidence of mitral stenosis.   6. The aortic valve is tricuspid. Aortic valve regurgitation is trivial.  No aortic stenosis is present.   7. Aortic dilatation noted. There is severe dilatation of the ascending  aorta, measuring 50 mm.   8. The inferior vena cava is normal in size with <50% respiratory  variability, suggesting right atrial pressure of 8 mmHg.   9. A small pericardial effusion is present.   CTA 05/22/20: IMPRESSION: 1. No evidence of acute pulmonary embolism. 2. Multifocal patchy airspace opacity within the right lung most pronounced within the right upper lobe. Findings are concerning for multifocal pneumonia. Follow-up to resolution is recommended. 3. 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 Recommend semi-annual imaging followup by CTA or MRA and referral to cardiothoracic surgery if not already obtained. This recommendation follows  2010 ACCF/AHA/AATS/ACR/ASA/SCA/SCAI/SIR/STS/SVM Guidelines for the Diagnosis and Management of Patients With Thoracic Aortic Disease. Circulation. 2010; 121: I967-E938. Aortic aneurysm NOS (ICD10-I71.9) 4. Marked four-chamber cardiomegaly with marked left ventricular hypertrophy. 5. Dilated main pulmonary trunk measuring 4.1 cm in diameter, suggesting pulmonary arterial hypertension. 6. Mildly enlarged mediastinal and right hilar lymph nodes, likely reactive.   EKG:   EKG is personally reviewed. 04/14/2021: EKG was not ordered. 08/19/2020 (Dr. Gardiner Rhyme): NSR, LVH with repolarization. 07/22/2020: EKG was not ordered. 09/18/21: Sinus bradycardia, LVH with strain, HR 53  Recent Labs: 09/06/2021: ALT 30; BUN 13; Creatinine, Ser 1.40; Hemoglobin 14.6; Platelets 255; Potassium 4.1; Sodium 140   Recent Lipid Panel    Component Value Date/Time   CHOL 119 12/16/2020 0846   TRIG 154 (H) 12/16/2020 0846   HDL 28 (L) 12/16/2020 0846   CHOLHDL 4.3 12/16/2020 0846   CHOLHDL 5.5 05/23/2020 0239   VLDL 16 05/23/2020 0239   LDLCALC 64 12/16/2020 0846     Risk Assessment/Calculations:       Physical Exam:    VS:  BP 118/80   Pulse (!) 53   Ht '5\' 9"'$  (1.753 m)   Wt 225 lb 9.6 oz (102.3 kg)   SpO2 95%   BMI 33.32 kg/m     Wt Readings from Last 3 Encounters:  09/18/21 225 lb 9.6 oz (102.3 kg)  09/06/21 226 lb (102.5 kg)  04/14/21 226 lb 9.6 oz (102.8 kg)     GEN: Comfortable, NAD HEENT: Normal NECK: No JVD; No carotid bruits CARDIAC: RRR, no murmurs RESPIRATORY:  Clear to auscultation without rales, wheezing or rhonchi  ABDOMEN: Soft, non-tender, non-distended MUSCULOSKELETAL:  No edema; No deformity  SKIN: Warm and dry NEUROLOGIC:  Alert and oriented x 3 PSYCHIATRIC:  Normal affect   ASSESSMENT:    1. Chronic combined systolic and diastolic CHF (congestive heart failure) ([place])   2. Hypertrophic cardiomyopathy ([place])   3. Coronary artery disease of native artery of native  heart with stable angina pectoris (HCC)   4. HFrEF (heart failure with reduced ejection fraction) ([place])   5. Dilated cardiomyopathy ([place])   6. Pre-procedure lab exam   7. Essential hypertension   8. Ascending aorta dilation (HCC)   9. Tobacco use     PLAN:    In order of problems listed above:  #Chronic Systolic Heart Failure with Improved EF: TTE 04/2020 with LVEF 25%, severe LVH, G2DD, mild RV dysfunction, severe LAE, moderate RAE, mild-to-moderate MR, trivial AI, severe aortic dilation, small pericaridal effusion. Cardiac stress MRI 08/19/20 with  basal to mid inferior stress perfusion defect consistent with ischemia, EF 34%. Findings consistent with likely both HTN and HCM. LHC 08/31/2020 with 60% distal RCA and no other obstructive disease to suggest ischemic drop in EF. Suspect HF secondary to HTN. Now on GDMT. Repeat TTE 02/2021 with improved EF 60-65%, G2DD, severe LVH, normal RV, mild MR. -TTE 02/2021 with improved EF 25>>60-65% -Continue entresto 97/'103mg'$  BID -Continue coreg 37.'5mg'$  BID -Continue farxiga '10mg'$  daily -Continue spironolactone '25mg'$  daily -Continue hydralazine to '25mg'$  TID -Continue imdur '15mg'$  qHS -Low Na diet  #HCM: #Hypertensive heart disease: CMR with severe hypertrophy that was most prominent in the basal septum that measured up to 24m. Patchy LGE noted accounting for 12% of myocardial mass. Likely mixed HCM and hypertensive heart disease. Cardiac monitor with 3 runs of NSVT with longest lasting 8 beats. Rare SVE and rare VE. -Refer to EP given septal thickness of 358mand NSVT on monitor -Refer to genetics as will need to screen first degree relatives for HCM -Continue entresto 97/'103mg'$  BID -Continue coreg 37.'5mg'$  BID -Continue spironolactone '25mg'$  daily -Continue hydralazine '25mg'$  TID -Continue imdur '15mg'$  qHS  #Coronary Artery Disease: LHC 08/31/2020 wwith 60% distal RCA, however, may be more significant than previously thought with evidence of ischemia on CMR.  Given recurrent admissions for chest pain, will refer back for cath for consideration of RCA PCI. -[facility] -Continue ASA '81mg'$  daily -Continue liptor '40mg'$  daily -Continue coreg 37.'5mg'$  BID  #Severe ascending aortic dilation: 4.7 cm by CTA 05/22/2020.  Ascending aorta 45 mm by cardiac stress MRI. No evidence of dissection on CTA chest in 08/2021. -Follows with Dr. AtOrvan Seenf cardiothoracic surgery.  #HLD -Continue liptor '40mg'$  daily -LDL 64 11/2020  #Moderate AI: #Mild to moderate MR: Moderate AI on CMR and appeared mild on TTE 02/2021. Mild MR noted on TTE 02/2021. -Serial TTE monitoring; next in 1-2 years  #Tobacco use: -Smoking cessation counseling  INFORMED CONSENT: I have reviewed the risks, indications, and alternatives to cardiac catheterization, possible angioplasty, and stenting with the patient. Risks include but are not limited to bleeding, infection, vascular injury, stroke, myocardial infection, arrhythmia, kidney injury, radiation-related injury in the case of prolonged fluoroscopy use, emergency cardiac surgery, and death. The patient understands the risks of serious complication is 1-2 in [id number] diagnostic cardiac cath and 1-2% or less with angioplasty/stenting.    Follow-up in 6 months.  Medication Adjustments/Labs and Tests Ordered: Current medicines are reviewed at length with the patient today.  Concerns regarding medicines are outlined above.   Orders Placed This Encounter  Procedures   CBC w/Diff   Basic metabolic panel   Hypertrophic Cardiomyopathy (COHESION)   Ambulatory referral to Cardiac Electrophysiology   Ambulatory referral to Genetics   EKG 12-Lead   Meds ordered this encounter  Medications   sacubitril-valsartan (ENTRESTO) 97-103 MG    Sig: Take 1 tablet by mouth 2 (two) times daily.    Dispense:  60 tablet    Refill:  4    Dose increase   dapagliflozin propanediol (FARXIGA) 10 MG TABS tablet    Sig: Take 1  tablet (10 mg total) by mouth daily.    Dispense:  30 tablet    Refill:  5   carvedilol (COREG) 25 MG tablet    Sig: Take 1.5 tablets (37.5 mg total) by mouth 2 (two) times daily.    Dispense:  270 tablet    Refill:  3    Dose increase  Patient Instructions  Medication Instructions:   Your physician recommends that you continue on your current medications as directed. Please refer to the Current Medication list given to you today.  *If you need a refill on your cardiac medications before your next appointment, please call your pharmacy*   1.) You have been referred to SEE ELECTROPHYSIOLOGIST HERE IN OUR OFFICE FOR HCM AND CONSIDERATION OF ICD   2.) You have been referred to GENETICS TO SEE DR. Lattie Corns FOR [place]   Lab Work:  TODAY--PRE-PROCEDURE LAB EXAM--CBC W DIFF AND BMET  If you have labs (blood work) drawn today and your tests are completely normal, you will receive your results only by: [employer] (if you have MyChart) OR A paper copy in the mail If you have any lab test that is abnormal or we need to change your treatment, we will call you to review the results.   Testing/Procedures:   [facility] OFFICE [place], [place] [address] Dept: [phone] Loc: [phone]  Thomas Spears  09/18/2021  You are scheduled for a Cardiac Catheterization on Wednesday, July 26 with Dr. [name].  1. Please arrive at the Main Entrance A at [facility]: [place], [place] [address] at 12:30 PM (This time is two hours before your procedure to ensure your preparation). Free valet parking service is available.   Special note: Every effort is made to have your procedure done on time. Please understand that emergencies sometimes delay scheduled procedures.  2. Diet: Do not eat solid foods after midnight.  You may have  clear liquids until 5 AM upon the day of the procedure.  3. Labs: You will need to have blood drawn on Monday, July 24 at [facility]. [address]  Open: 7:30am - 5pm    Phone: [phone]. You do not need to be fasting.  4. Medication instructions in preparation for your procedure:   Contrast Allergy: No   Stop taking, SPIRONOLACTONE AND FARXIGA THE MORNING OF THIS PROCEDURE  Wednesday, July 26,   On the morning of your procedure, take Aspirin and any morning medicines NOT listed above.  You may use sips of water.  5. Plan to go home the same day, you will only stay overnight if medically necessary. 6. You MUST have a responsible adult to drive you home. 7. An adult MUST be with you the first 24 hours after you arrive home. 8. Bring a current list of your medications, and the last time and date medication taken. 9. Bring ID and current insurance cards. 10.Please wear clothes that are easy to get on and off and wear slip-on shoes.  Thank you for allowing [place] to care for you!   -- [facility]    Follow-Up:  3 MONTHS IN THE OFFICE WITH DR. Johney Frame OR AN EXTENDER          Signed, Freada Bergeron, MD  09/18/2021 12:38 PM    [place]

## 2021-09-15 NOTE — Progress Notes (Unsigned)
Cardiology Office Note:    Date:  09/18/2021   ID:  Joni Norrod, DOB 1975-07-17, MRN [id number]  PCP:  Ezequiel Essex, MD   [facility] Providers Cardiologist:  Freada Bergeron, MD {    Referring MD: Ezequiel Essex, MD    History of Present Illness:    Thomas Spears is a 46 y.o. male with a hx of systolic heart failure with LVEF 25% 04/2020 that improved to 60-65% in 02/2021, HCM on CMR on 07/2020, HTN, mild-to-moderate MR, severe aortic dilation measuring 23m who presents to clinic for follow-up.  [facility] where he presented with dyspnea and HA found to have Bps 220s/150s. CTA chest showed no PE but demonstrated thoracic aortic aneurysm 4.7cm as well as evidence of pulmonary HTN. He was placed on a nitro gtt. Trop 400>433. TTE 05/23/20 showed LVEF 25% with global hypokinesis, severe LVH, G2DD, mild RV dysfunction, severe LAE, moderate RAE, mild-to-moderate MR, trivial AI, severe aortic dilation, small pericardial effusion. Prior to completed work-up, the patient left AMA.   He was seen in clinic 07/23/2018 where he reported several years of not taking his medication and uncontrolled hypertension.  Also regularly drinking energy drinks, soda, smoking 2 packs/day.  He used to be a heavy drinker.  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  He was taking all of his blood pressure medications and feeling overall well.  He displayed NYHA I-II symptoms.  He was recommended for stress MRI to assess for ischemia and evidence of amyloidosis. [facility]  He was referred to the hypertension clinic.   Seen by pharmacy team 08/09/20 and Carvedilol increased to 12.'5mg'$  QD.    He had cardiac stress MRI 08/19/20 showing basal to mid inferior stress perfusion defect consistent with ischemia.  He had moderate LV dilation with moderate systolic  dysfunction LVEF 34%.  Asymmetric LV hypertrophy measuring up to 39 mL basal septum differential including hypertrophic cardiomyopathy versus hypertensive heart disease.  No evidence of cardiac amyloidosis.  Normal RV size with mild systolic dysfunction, moderate aortic regurgitation, mild to moderate mitral regurgitation, ascending aortic aneurysm measuring 45 mm.Cardiac catheterization performed 7/07-2020 by Dr. HEllyn Hackshowing diffuse mild to moderate multivessel disease, no culprit lesion to explain reduced LVEF, no previously placed stent.   Seen by pharmacy team 09/06/20 and transitioned from indapamide to spironolactone. Seen 09/14/20 with EDelene Lollincreased to 49-'51mg'$  BID. 09/28/20 pharmacy increased Carvedilol to '25mg'$  BID and Spironolactone to '25mg'$  QD, Entresto increased to 97/'103mg'$ .    Seen by pharmacy team 12/02/20. He was continuing to decrease smoking. [facility]. His Carvedilol was increased to 37.'5mg'$  QD as diastolic blood pressure still above goal. Transition from spironolactone to eplenerone was considered but deferred as he was without insurance.   Saw C57.$[id number]$11/2020 where he was doing well. BP 130-140s at that time. Echo on 03/20/21 showed improved LVEF to 60-65%.  Was last seen in clinic on 03/2021 where he was having atypical chest pain when laying flat at night but was otherwise doing well.   He has had 2 ER visits over the past month for chest pain. Trop 30-40s. He underwent CTA dissection study that showed possible small dissection flap above the coronary cusp. He was recommended for a gated CT chest that showed linear defect along the aortic root but this is unchanged and possible PE in  the RUL, but d-dimer was normal with greater suspicion that this was related to a pulmonary nodule. Dr. Roxan Hockey was consulted who recommended repeat CTA which was negative for any dissection or PE. He was discharged home.  Today, the  patient states he overall feels okay. He had an episode of chest tightness about 2 weeks ago for which he was seen in the ER as detailed above. Since that time, he has not had recurrence of the chest discomfort. His main complaints are intermittent headaches.  No SOB, LE edema, orthopnea, or PND. Has rare palpitations but no syncope. Has been compliant with all medications. Blood pressure is well controlled.    Past Medical History:  Diagnosis Date   Aortic regurgitation    07/2020 cardiac MRI moderate aortic regurgitation   Ascending aorta dilation (HCC)    05/01/20 CTA 4.7 cm. 08/19/20 cardiac stress MRI 46m.   Chronic combined systolic [place] diastolic [place] ([place]    Community acquired pneumonia    HLD (hyperlipidemia)    Hypertension    Hypertensive crisis 05/22/2020   Left ventricular hypertrophy    Mitral regurgitation    07/2020 cardiac MRI mild ot moderate MR   Tension headache 06/21/2020   Tobacco use     Past Surgical History:  Procedure Laterality Date   CAROTID STENT INSERTION     KNEE SURGERY Left    LEFT HEART CATH AND CORONARY ANGIOGRAPHY N/A 08/31/2020   Procedure: LEFT HEART CATH AND CORONARY ANGIOGRAPHY;  Surgeon: HLeonie Man MD;  [facility];  Service: Cardiovascular;  Laterality: N/A;   TONSILLECTOMY      Current Medications: Current Meds  Medication Sig   acetaminophen (TYLENOL) 500 MG tablet Take 2 tablets (1,000 mg total) by mouth every 6 (six) hours as needed (pain). (Patient taking differently: Take 1,000 mg by mouth every 6 (six) hours as needed for moderate pain (pain).)   aspirin EC 81 MG tablet Take 1 tablet (81 mg total) by mouth every evening. Swallow whole.   atorvastatin (LIPITOR) 40 MG tablet TAKE 1 TABLET(40 MG) BY MOUTH DAILY (Patient taking differently: Take 40 mg by mouth every evening.)   hydrALAZINE (APRESOLINE) 25 MG tablet Take 1 tablet (25 mg total) by mouth 3 (three) times daily.   isosorbide  mononitrate (IMDUR) 30 MG 24 hr tablet Take 0.5 tablets (15 mg total) by mouth daily.   spironolactone (ALDACTONE) 25 MG tablet Take 1 tablet (25 mg total) by mouth daily.   [DISCONTINUED] carvedilol (COREG) 25 MG tablet Take 1.5 tablets (37.5 mg total) by mouth 2 (two) times daily.   [DISCONTINUED] dapagliflozin propanediol (FARXIGA) 10 MG TABS tablet Take 10 mg by mouth daily.   [DISCONTINUED] sacubitril-valsartan (ENTRESTO) 97-103 MG Take 1 tablet by mouth 2 (two) times daily.     Allergies:   Mushroom extract complex   Social History   Socioeconomic History   Marital status: Single    Spouse name: Not on file   Number of children: Not on file   Years of education: Not on file   Highest education level: Not on file  Occupational History   Not on file  Tobacco Use   Smoking status: Light Smoker   Smokeless tobacco: Never  Substance and Sexual Activity   Alcohol use: Not Currently   Drug use: Not on file   Sexual activity: Yes  Other Topics Concern   Not on file  Social History Narrative   Not on file  Social Determinants of Health   Financial Resource Strain: Not on file  Food Insecurity: Not on file  Transportation Needs: Not on file  Physical Activity: Not on file  Stress: Not on file  Social Connections: Not on file     Family History: The patient's family history is not on file.  ROS:   Please see the history of present illness.    Review of Systems  Constitutional:  Negative for fever and weight loss.  HENT:  Negative for nosebleeds.   Eyes:  Negative for photophobia and pain.  Respiratory:  Negative for cough, hemoptysis and wheezing.   Cardiovascular:  Positive for chest pain and palpitations. Negative for orthopnea, claudication, leg swelling and PND.  Gastrointestinal:  Negative for abdominal pain and vomiting.  Genitourinary:  Negative for frequency and urgency.  Musculoskeletal:  Negative for falls.  Neurological:  Positive for headaches. Negative  for tingling and seizures.     EKGs/Labs/Other Studies Reviewed:    The following studies were reviewed today: CTA chest 08/2021: FINDINGS: Cardiovascular: There is preferential opacification of the thoracic aorta. The aortic valve is trileaflet. The thoracic aorta is normal in course and caliber without evidence of intramural hematoma or dissection. The thoracic aorta is mildly dilated measuring 5.7 by 5.7 cm at the sinuses of Valsalva (image # 81/14 and 89/9). The ascending aorta is mildly dilated measuring 4.1 x 4.2 cm (image # 81/14 and 107/15). The descending thoracic aorta is mildly dilated measuring 3.2 x 3.0 cm just beyond the takeoff of the left subclavian artery (image # 123/14 and 132/15). No significant atherosclerotic plaque identified. The arch vasculature is widely patent proximally and demonstrates normal anatomic configuration.   Global cardiac size is within normal limits, however, there is left ventricular hypertrophy again noted with asymmetric septal hypertrophy noted. This results in marked narrowing of the left ventricular outflow tract with a cross-sectional area of the a outflow tract approximately 7 mm x 25 mm best seen on coronal reformat # 87 and sagittal reformat # 123. This was better assessed on MRI examination of 08/19/2020. Moderate coronary artery calcification. No pericardial effusion. Central pulmonary arteries are of normal caliber. There is adequate opacification of the pulmonary arterial tree and no intraluminal filling defect is identified through the segmental level to suggest acute pulmonary embolism.   Mediastinum/Nodes: No enlarged mediastinal, hilar, or axillary lymph nodes. Thyroid gland, trachea, and esophagus demonstrate no significant findings.   Lungs/Pleura: Lungs are clear. No pleural effusion or pneumothorax.   Upper Abdomen: No acute abnormality.   Musculoskeletal: No chest wall abnormality. No acute or  significant osseous findings.   Review of the MIP images confirms the above findings.   IMPRESSION: 1. No pulmonary embolism. 2. Mild aneurysmal dilation of the thoracic aorta at the sinuses of Valsalva, ascending aorta, and descending thoracic aorta. Maximal diameter 5.7 cm at the sinuses of Valsalva. No evidence of intramural hematoma or dissection. 3. 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 This was better assessed on MRI examination of 08/19/2020. 4. Moderate coronary artery calcification. TTE 03/20/21: IMPRESSIONS     1. No obstructive gradient. Left ventricular ejection fraction, by  estimation, is 60 to 65%. The left ventricle has normal function. The left  ventricle has no regional wall motion abnormalities. There is severe  concentric left ventricular hypertrophy.  Left ventricular diastolic parameters are consistent with Grade II  diastolic dysfunction (pseudonormalization).   2. Right ventricular systolic function  is normal. The right ventricular  size is normal.   3. Left atrial size was moderately dilated.   4. The mitral valve is normal in structure. Mild mitral valve  regurgitation. No evidence of mitral stenosis.   5. The aortic valve is normal in structure. Aortic valve regurgitation is  mild. No aortic stenosis is present.   6. Aortic dilatation noted. There is moderate dilatation of the aortic  root, measuring 48 mm. There is moderate dilatation of the ascending  aorta, measuring 45 mm.   7. The inferior vena cava is normal in size with greater than 50%  respiratory variability, suggesting right atrial pressure of 3 mmHg.   Comparison(s): The left ventricular function has improved. EF 25%, severe  LVH, AOV 36m, small pericardial effusion.   Conclusion(s)/Recommendation(s): Findings consistent with hypertrophic  cardiomyopathy. Prior Cardiac MRI has been  performed.   Echo 03/20/2021:  1. No obstructive gradient. Left ventricular ejection fraction, by  estimation, is 60 to 65%. The left ventricle has normal function. The left  ventricle has no regional wall motion abnormalities. There is severe  concentric left ventricular hypertrophy.  Left ventricular diastolic parameters are consistent with Grade II  diastolic dysfunction (pseudonormalization).   2. Right ventricular systolic function is normal. The right ventricular  size is normal.   3. Left atrial size was moderately dilated.   4. The mitral valve is normal in structure. Mild mitral valve  regurgitation. No evidence of mitral stenosis.   5. The aortic valve is normal in structure. Aortic valve regurgitation is  mild. No aortic stenosis is present.   6. Aortic dilatation noted. There is moderate dilatation of the aortic  root, measuring 48 mm. There is moderate dilatation of the ascending  aorta, measuring 45 mm.   7. The inferior vena cava is normal in size with greater than 50%  respiratory variability, suggesting right atrial pressure of 3 mmHg.   Comparison(s): The left ventricular function has improved. EF 25%, severe  LVH, AOV 564m small pericardial effusion.   Conclusion(s)/Recommendation(s): Findings consistent with hypertrophic  cardiomyopathy. Prior Cardiac MRI has been performed.   CT Chest 12/21/2020: COMPARISON:  CT angiography of the chest of March of 2022.   FINDINGS: Cardiovascular: Ascending aorta of 4.7 cm in transverse dimension stable compared to previous imaging. Descending thoracic aorta 3.4 cm also unchanged. Coronal dimension of the ascending thoracic aorta approximately 4.3 cm stable when measured in a similar fashion as well on the previous imaging study. The heart size remains enlarged. Coronary artery calcification of LEFT coronary circulation similar to the prior study. No substantial pericardial effusion. Normal caliber of central pulmonary  vessels. Limited assessment of cardiovascular structures given lack of intravenous contrast.   Mediastinum/Nodes: No thoracic inlet, axillary, mediastinal or hilar adenopathy. Esophagus grossly normal.   Lungs/Pleura: Lungs are clear. Airways are patent. Mild paraseptal emphysema at the lung apices.   Upper Abdomen: No acute abnormality.   Musculoskeletal: No chest wall mass or suspicious bone lesions identified.   IMPRESSION: Stable aneurysmal dilation of the ascending thoracic aorta measuring up to 4.7 cm in transverse dimension. 4.3 cm in greatest coronal dimension unchanged from previous imaging. Ascending thoracic aortic aneurysm. Recommend semi-annual imaging followup by CTA or MRA and referral to cardiothoracic surgery if not already obtained. This recommendation follows 2010 ACCF/AHA/AATS/ACR/ASA/SCA/SCAI/SIR/STS/SVM Guidelines for the Diagnosis and Management of Patients With Thoracic Aortic Disease. Circulation. 2010; 121: : V035-K093Aortic aneurysm NOS (ICD10-I71.9)   Stable cardiomegaly and coronary artery calcification.   Mild paraseptal  emphysema at the lung apices.   Aortic Atherosclerosis (ICD10-I70.0) and Emphysema (ICD10-J43.9).  Monitor 09/20/2020: Patch wear time was 8 days and 12 hours. Predominant rhythm was NSR with average HR 79bpm (ranging from 34-169bpm). There were 3 runs of nonsustained VT with longest/fastest lasting 8 beats at a rate of 169bpm Rare SVE (<1%), rare VE (<1%) Mobitz type I block seen during sleep Patient triggered events correlated with NSR No Afib, sustained arrhythmias or significant pauses     Patch Wear Time:  8 days and 12 hours (2022-07-11T21:01:27-0400 to 2022-07-20T09:54:00-0400)   Patient had a min HR of 34 bpm, max HR of 169 bpm, and avg HR of 79 bpm. Predominant underlying rhythm was Sinus Rhythm. First Degree AV Block was present. 3 Ventricular Tachycardia runs occurred, the run with the fastest interval lasting 8 beats  with a  max rate of 169 bpm (avg 130 bpm); the run with the fastest interval was also the longest. Second Degree AV Block-Mobitz I (Wenckebach) was present. Isolated SVEs were rare (<1.0%), and no SVE Couplets or SVE Triplets were present. Isolated VEs were rare  (<1.0%, 170), VE Couplets were rare (<1.0%, 12), and VE Triplets were rare (<1.0%, 6). Ventricular Trigeminy was present.   LHC 08/04/60 LV end diastolic pressure is moderately elevated. There is no aortic valve stenosis. Mid LM to Dist LM lesion is 20% stenosed. Ost LAD to Prox LAD lesion is 30% stenosed. Mid LAD lesion is 45% stenosed @ 1st Diag (40% pre & 50% post 1st Diag) Prox RCA lesion is 30% stenosed. Dist RCA lesion is 60% stenosed - discrete focal lesion, not flow limiting. No evidence of stent.   SUMMARY Diffuse mild to moderate multivessel disease but no obvious culprit lesion to explain significant reduced EF. Unable to visualize any previously placed stents. Moderately elevated LVEDP     RECOMMENDATIONS Continue to titrate GDMT for nonischemic CM. ->  Anticipate titrating up carvedilol to 12.5 mg twice daily and further titration of Entresto. May also consider spironolactone  Cardiac MRI Stress 08/19/20 IMPRESSION: 1. Basal to mid inferior stress perfusion defect consistent with ischemia   2. Moderate LV dilatation with moderate systolic dysfunction (EF 95%)   3. Asymmetric LV hypertrophy measuring up to 66m in basal septum (153min posterior wall). Differential includes hypertrophic cardiomyopathy vs hypertensive heart disease given history of severe uncontrolled hypertension. No evidence of cardiac amyloidosis. Patchy midwall LGE, which can be seen in either HCM or hypertensive heart disease. Suspect component of hypertensive heart disease, but also with likely HCM given extent of hypertrophy and significant amount of LGE, accounting for 12% of total myocardial mass   4.  Normal RV size with mild  systolic dysfunction (EF 4228%  5.  Moderate aortic regurgitation (regurgitant fraction 27%)   6.  Mild to moderate mitral regurgitation (regurgitant fraction 21%)   7.  Ascending aortic aneurysm measuring 4570m TTE 05/23/20: IMPRESSIONS   1. Left ventricular ejection fraction, by estimation, is 25%. The left  ventricle has severely decreased function. The left ventricle demonstrates  global hypokinesis. The left ventricular internal cavity size was mildly  dilated. There is severe left  ventricular hypertrophy raising concern cardiac amyloidosis versus  long-standing severe hypertension. Left ventricular diastolic parameters  are consistent with Grade II diastolic dysfunction (pseudonormalization).   2. Right ventricular systolic function is mildly reduced. The right  ventricular size is normal.   3. Left atrial size was severely dilated.   4. Right atrial size was moderately  dilated.   5. The mitral valve is normal in structure. Mild to moderate mitral valve  regurgitation. No evidence of mitral stenosis.   6. The aortic valve is tricuspid. Aortic valve regurgitation is trivial.  No aortic stenosis is present.   7. Aortic dilatation noted. There is severe dilatation of the ascending  aorta, measuring 50 mm.   8. The inferior vena cava is normal in size with <50% respiratory  variability, suggesting right atrial pressure of 8 mmHg.   9. A small pericardial effusion is present.   CTA 05/22/20: IMPRESSION: 1. No evidence of acute pulmonary embolism. 2. Multifocal patchy airspace opacity within the right lung most pronounced within the right upper lobe. Findings are concerning for multifocal pneumonia. Follow-up to resolution is recommended. 3. 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 Recommend semi-annual imaging followup by CTA or MRA and referral to cardiothoracic surgery if not already obtained. This recommendation follows  2010 ACCF/AHA/AATS/ACR/ASA/SCA/SCAI/SIR/STS/SVM Guidelines for the Diagnosis and Management of Patients With Thoracic Aortic Disease. Circulation. 2010; 121: X833-A250. Aortic aneurysm NOS (ICD10-I71.9) 4. Marked four-chamber cardiomegaly with marked left ventricular hypertrophy. 5. Dilated main pulmonary trunk measuring 4.1 cm in diameter, suggesting pulmonary arterial hypertension. 6. Mildly enlarged mediastinal and right hilar lymph nodes, likely reactive.   EKG:   EKG is personally reviewed. 04/14/2021: EKG was not ordered. 08/19/2020 (Dr. Gardiner Rhyme): NSR, LVH with repolarization. 07/22/2020: EKG was not ordered. 09/18/21: Sinus bradycardia, LVH with strain, HR 53  Recent Labs: 09/06/2021: ALT 30; BUN 13; Creatinine, Ser 1.40; Hemoglobin 14.6; Platelets 255; Potassium 4.1; Sodium 140   Recent Lipid Panel    Component Value Date/Time   CHOL 119 12/16/2020 0846   TRIG 154 (H) 12/16/2020 0846   HDL 28 (L) 12/16/2020 0846   CHOLHDL 4.3 12/16/2020 0846   CHOLHDL 5.5 05/23/2020 0239   VLDL 16 05/23/2020 0239   LDLCALC 64 12/16/2020 0846     Risk Assessment/Calculations:       Physical Exam:    VS:  BP 118/80   Pulse (!) 53   Ht '5\' 9"'$  (1.753 m)   Wt 225 lb 9.6 oz (102.3 kg)   SpO2 95%   BMI 33.32 kg/m     Wt Readings from Last 3 Encounters:  09/18/21 225 lb 9.6 oz (102.3 kg)  09/06/21 226 lb (102.5 kg)  04/14/21 226 lb 9.6 oz (102.8 kg)     GEN: Comfortable, NAD HEENT: Normal NECK: No JVD; No carotid bruits CARDIAC: RRR, no murmurs RESPIRATORY:  Clear to auscultation without rales, wheezing or rhonchi  ABDOMEN: Soft, non-tender, non-distended MUSCULOSKELETAL:  No edema; No deformity  SKIN: Warm and dry NEUROLOGIC:  Alert and oriented x 3 PSYCHIATRIC:  Normal affect   ASSESSMENT:    1. Chronic combined systolic and diastolic CHF (congestive heart failure) ([place])   2. Hypertrophic cardiomyopathy ([place])   3. Coronary artery disease of native artery of native  heart with stable angina pectoris (HCC)   4. HFrEF (heart failure with reduced ejection fraction) ([place])   5. Dilated cardiomyopathy ([place])   6. Pre-procedure lab exam   7. Essential hypertension   8. Ascending aorta dilation (HCC)   9. Tobacco use     PLAN:    In order of problems listed above:  #Chronic Systolic Heart Failure with Improved EF: TTE 04/2020 with LVEF 25%, severe LVH, G2DD, mild RV dysfunction, severe LAE, moderate RAE, mild-to-moderate MR, trivial AI, severe aortic dilation, small pericaridal effusion. Cardiac stress MRI 08/19/20 with  basal to mid inferior stress perfusion defect consistent with ischemia, EF 34%. Findings consistent with likely both HTN and HCM. LHC 08/31/2020 with 60% distal RCA and no other obstructive disease to suggest ischemic drop in EF. Suspect HF secondary to HTN. Now on GDMT. Repeat TTE 02/2021 with improved EF 60-65%, G2DD, severe LVH, normal RV, mild MR. -TTE 02/2021 with improved EF 25>>60-65% -Continue entresto 97/'103mg'$  BID -Continue coreg 37.'5mg'$  BID -Continue farxiga '10mg'$  daily -Continue spironolactone '25mg'$  daily -Continue hydralazine to '25mg'$  TID -Continue imdur '15mg'$  qHS -Low Na diet  #HCM: #Hypertensive heart disease: CMR with severe hypertrophy that was most prominent in the basal septum that measured up to 72m. Patchy LGE noted accounting for 12% of myocardial mass. Likely mixed HCM and hypertensive heart disease. Cardiac monitor with 3 runs of NSVT with longest lasting 8 beats. Rare SVE and rare VE. -Refer to EP given septal thickness of 337mand NSVT on monitor -Refer to genetics as will need to screen first degree relatives for HCM -Continue entresto 97/'103mg'$  BID -Continue coreg 37.'5mg'$  BID -Continue spironolactone '25mg'$  daily -Continue hydralazine '25mg'$  TID -Continue imdur '15mg'$  qHS  #Coronary Artery Disease: LHC 08/31/2020 wwith 60% distal RCA, however, may be more significant than previously thought with evidence of ischemia on CMR.  Given recurrent admissions for chest pain, will refer back for cath for consideration of RCA PCI. -[facility] -Continue ASA '81mg'$  daily -Continue liptor '40mg'$  daily -Continue coreg 37.'5mg'$  BID  #Severe ascending aortic dilation: 4.7 cm by CTA 05/22/2020.  Ascending aorta 45 mm by cardiac stress MRI. No evidence of dissection on CTA chest in 08/2021. -Follows with Dr. AtOrvan Seenf cardiothoracic surgery.  #HLD -Continue liptor '40mg'$  daily -LDL 64 11/2020  #Moderate AI: #Mild to moderate MR: Moderate AI on CMR and appeared mild on TTE 02/2021. Mild MR noted on TTE 02/2021. -Serial TTE monitoring; next in 1-2 years  #Tobacco use: -Smoking cessation counseling  INFORMED CONSENT: I have reviewed the risks, indications, and alternatives to cardiac catheterization, possible angioplasty, and stenting with the patient. Risks include but are not limited to bleeding, infection, vascular injury, stroke, myocardial infection, arrhythmia, kidney injury, radiation-related injury in the case of prolonged fluoroscopy use, emergency cardiac surgery, and death. The patient understands the risks of serious complication is 1-2 in 103875ith diagnostic cardiac cath and 1-2% or less with angioplasty/stenting.    Follow-up in 6 months.  Medication Adjustments/Labs and Tests Ordered: Current medicines are reviewed at length with the patient today.  Concerns regarding medicines are outlined above.   Orders Placed This Encounter  Procedures   CBC w/Diff   Basic metabolic panel   Hypertrophic Cardiomyopathy (COHESION)   Ambulatory referral to Cardiac Electrophysiology   Ambulatory referral to Genetics   EKG 12-Lead   Meds ordered this encounter  Medications   sacubitril-valsartan (ENTRESTO) 97-103 MG    Sig: Take 1 tablet by mouth 2 (two) times daily.    Dispense:  60 tablet    Refill:  4    Dose increase   dapagliflozin propanediol (FARXIGA) 10 MG TABS tablet    Sig: Take 1  tablet (10 mg total) by mouth daily.    Dispense:  30 tablet    Refill:  5   carvedilol (COREG) 25 MG tablet    Sig: Take 1.5 tablets (37.5 mg total) by mouth 2 (two) times daily.    Dispense:  270 tablet    Refill:  3    Dose increase  Patient Instructions  Medication Instructions:   Your physician recommends that you continue on your current medications as directed. Please refer to the Current Medication list given to you today.  *If you need a refill on your cardiac medications before your next appointment, please call your pharmacy*   1.) You have been referred to SEE ELECTROPHYSIOLOGIST HERE IN OUR OFFICE FOR HCM AND CONSIDERATION OF ICD   2.) You have been referred to GENETICS TO SEE DR. Lattie Corns FOR [place]   Lab Work:  TODAY--PRE-PROCEDURE LAB EXAM--CBC W DIFF AND BMET  If you have labs (blood work) drawn today and your tests are completely normal, you will receive your results only by: [employer] (if you have MyChart) OR A paper copy in the mail If you have any lab test that is abnormal or we need to change your treatment, we will call you to review the results.   Testing/Procedures:   [place] OFFICE [place], [place] [address] Dept: [phone] Loc: [phone]  Jhordan Mckibben  09/18/2021  You are scheduled for a Cardiac Catheterization on Wednesday, July 26 with Dr. [name].  1. Please arrive at the Main Entrance A at [facility]: [place], [place] [address] at 12:30 PM (This time is two hours before your procedure to ensure your preparation). Free valet parking service is available.   Special note: Every effort is made to have your procedure done on time. Please understand that emergencies sometimes delay scheduled procedures.  2. Diet: Do not eat solid foods after midnight.  You may have  clear liquids until 5 AM upon the day of the procedure.  3. Labs: You will need to have blood drawn on Monday, July 24 at [facility] at [facility]. [address]  Open: 7:30am - 5pm    Phone: [phone]. You do not need to be fasting.  4. Medication instructions in preparation for your procedure:   Contrast Allergy: No   Stop taking, SPIRONOLACTONE AND FARXIGA THE MORNING OF THIS PROCEDURE  Wednesday, July 26,   On the morning of your procedure, take Aspirin and any morning medicines NOT listed above.  You may use sips of water.  5. Plan to go home the same day, you will only stay overnight if medically necessary. 6. You MUST have a responsible adult to drive you home. 7. An adult MUST be with you the first 24 hours after you arrive home. 8. Bring a current list of your medications, and the last time and date medication taken. 9. Bring ID and current insurance cards. 10.Please wear clothes that are easy to get on and off and wear slip-on shoes.  Thank you for allowing [place] to care for you!   -- [place] [facility]    Follow-Up:  3 MONTHS IN THE OFFICE WITH DR. Johney Frame OR AN EXTENDER          Signed, Freada Bergeron, MD  09/18/2021 12:38 PM    [place]

## 2021-09-18 ENCOUNTER — Encounter: Payer: Self-pay | Admitting: Cardiology

## 2021-09-18 ENCOUNTER — Ambulatory Visit (INDEPENDENT_AMBULATORY_CARE_PROVIDER_SITE_OTHER): Payer: 59 | Admitting: Cardiology

## 2021-09-18 ENCOUNTER — Telehealth: Payer: Self-pay | Admitting: *Deleted

## 2021-09-18 VITALS — BP 118/80 | HR 53 | Ht 69.0 in | Wt 225.6 lb

## 2021-09-18 DIAGNOSIS — I420 Dilated cardiomyopathy: Secondary | ICD-10-CM

## 2021-09-18 DIAGNOSIS — I10 Essential (primary) hypertension: Secondary | ICD-10-CM

## 2021-09-18 DIAGNOSIS — I77810 Thoracic aortic ectasia: Secondary | ICD-10-CM

## 2021-09-18 DIAGNOSIS — I25118 Atherosclerotic heart disease of native coronary artery with other forms of angina pectoris: Secondary | ICD-10-CM | POA: Diagnosis not present

## 2021-09-18 DIAGNOSIS — I5042 Chronic combined systolic (congestive) and diastolic (congestive) heart failure: Secondary | ICD-10-CM

## 2021-09-18 DIAGNOSIS — Z01812 Encounter for preprocedural laboratory examination: Secondary | ICD-10-CM

## 2021-09-18 DIAGNOSIS — I422 Other hypertrophic cardiomyopathy: Secondary | ICD-10-CM

## 2021-09-18 DIAGNOSIS — I5020 Unspecified systolic (congestive) heart failure: Secondary | ICD-10-CM

## 2021-09-18 DIAGNOSIS — Z720 Tobacco use: Secondary | ICD-10-CM

## 2021-09-18 LAB — BASIC METABOLIC PANEL
BUN/Creatinine Ratio: 14 (ref 9–20)
BUN: 19 mg/dL (ref 6–24)
CO2: 28 mmol/L (ref 20–29)
Calcium: 9.7 mg/dL (ref 8.7–10.2)
Chloride: 104 mmol/L (ref 96–106)
Creatinine, Ser: 1.36 mg/dL — ABNORMAL HIGH (ref 0.76–1.27)
Glucose: 87 mg/dL (ref 70–99)
Potassium: 4.9 mmol/L (ref 3.5–5.2)
Sodium: 137 mmol/L (ref 134–144)
eGFR: 65 mL/min/{1.73_m2} (ref >59)

## 2021-09-18 LAB — CBC WITH DIFFERENTIAL/PLATELET
Basophils Absolute: 0.1 10*3/uL (ref 0.0–0.2)
Basos: 0 %
EOS (ABSOLUTE): 0.3 10*3/uL (ref 0.0–0.4)
Eos: 3 %
Hematocrit: 44.3 % (ref 37.5–51.0)
Hemoglobin: 14.9 g/dL (ref 13.0–17.7)
Lymphocytes Absolute: 1.9 10*3/uL (ref 0.7–3.1)
Lymphs: 17 %
MCH: 29.6 pg (ref 26.6–33.0)
MCHC: 33.6 g/dL (ref 31.5–35.7)
MCV: 88 fL (ref 79–97)
Monocytes Absolute: 0.9 10*3/uL (ref 0.1–0.9)
Monocytes: 8 %
Neutrophils Absolute: 8.1 10*3/uL — ABNORMAL HIGH (ref 1.4–7.0)
Neutrophils: 72 %
Platelets: 260 10*3/uL (ref 150–450)
RBC: 5.04 x10E6/uL (ref 4.14–5.80)
RDW: 14.5 % (ref 11.6–15.4)
WBC: 11.2 10*3/uL — ABNORMAL HIGH (ref 3.4–10.8)

## 2021-09-18 MED ORDER — ENTRESTO 97-103 MG PO TABS: 1.0000 | ORAL_TABLET | Freq: Two times a day (BID) | ORAL | 4 refills | Status: DC

## 2021-09-18 MED ORDER — DAPAGLIFLOZIN PROPANEDIOL 10 MG PO TABS: 10.0000 mg | ORAL_TABLET | Freq: Every day | ORAL | 5 refills | Status: DC

## 2021-09-18 MED ORDER — CARVEDILOL 25 MG PO TABS: 37.5000 mg | ORAL_TABLET | Freq: Two times a day (BID) | ORAL | 3 refills | Status: DC

## 2021-09-18 NOTE — Telephone Encounter (Signed)
-----   Message from [facility] sent at 09/18/2021 11:28 AM EDT ----- Regarding: RE: referred to the pt to Genetics to see Dr. Broadus John Patient scheduled for 11/14 at 1pm, he did not want to be added to the wait list in case of a sooner appt. Just FYI.  ----- Message ----- From: Nuala Alpha, LPN Sent: 9/86/1483  10:39 AM EDT To: Nuala Alpha, LPN; Imagene Gurney Subject: referred to the pt to Genetics to see Dr. Jo#  Dr. Johney Frame referred this pt to Genetics to see Dr. Lattie Corns for HCM.  Referral placed and I think I placed the COHESION labs correctly for HCM.  Can you please schedule pt and let me know when?  Thanks for all you do,  Karlene Einstein

## 2021-09-18 NOTE — Patient Instructions (Signed)
Medication Instructions:   Your physician recommends that you continue on your current medications as directed. Please refer to the Current Medication list given to you today.  *If you need a refill on your cardiac medications before your next appointment, please call your pharmacy*   1.) You have been referred to SEE ELECTROPHYSIOLOGIST HERE IN OUR OFFICE FOR HCM AND CONSIDERATION OF ICD   2.) You have been referred to GENETICS TO SEE DR. Lattie Corns FOR [place]   Lab Work:  TODAY--PRE-PROCEDURE LAB EXAM--CBC W DIFF AND BMET  If you have labs (blood work) drawn today and your tests are completely normal, you will receive your results only by: [employer] (if you have MyChart) OR A paper copy in the mail If you have any lab test that is abnormal or we need to change your treatment, we will call you to review the results.   Testing/Procedures:   [place] OFFICE [place], [place] [address] Dept: [phone] Loc: [phone]  Rajan Burgard  09/18/2021  You are scheduled for a Cardiac Catheterization on Wednesday, July 26 with Dr. [name].  1. Please arrive at the Main Entrance A at [facility]: [place], [place] [address] at 12:30 PM (This time is two hours before your procedure to ensure your preparation). Free valet parking service is available.   Special note: Every effort is made to have your procedure done on time. Please understand that emergencies sometimes delay scheduled procedures.  2. Diet: Do not eat solid foods after midnight.  You may have clear liquids until 5 AM upon the day of the procedure.  3. Labs: You will need to have blood drawn on Monday, July 24 at [facility] at [facility]. [address]  Open: 7:30am - 5pm    Phone: [phone]. You do not need to be  fasting.  4. Medication instructions in preparation for your procedure:   Contrast Allergy: No   Stop taking, SPIRONOLACTONE AND FARXIGA THE MORNING OF THIS PROCEDURE  Wednesday, July 26,   On the morning of your procedure, take Aspirin and any morning medicines NOT listed above.  You may use sips of water.  5. Plan to go home the same day, you will only stay overnight if medically necessary. 6. You MUST have a responsible adult to drive you home. 7. An adult MUST be with you the first 24 hours after you arrive home. 8. Bring a current list of your medications, and the last time and date medication taken. 9. Bring ID and current insurance cards. 10.Please wear clothes that are easy to get on and off and wear slip-on shoes.  Thank you for allowing [place] to care for you!   -- [place] [facility]    Follow-Up:  3 MONTHS IN THE OFFICE WITH DR. Johney Frame OR AN EXTENDER

## 2021-09-19 ENCOUNTER — Telehealth: Payer: Self-pay | Admitting: *Deleted

## 2021-09-19 NOTE — Telephone Encounter (Signed)
Left message for patient to call back to review procedure instructions 

## 2021-09-19 NOTE — Telephone Encounter (Signed)
Reviewed procedure instructions with patient's fiance (DPR), Malachy Mood.

## 2021-09-19 NOTE — Telephone Encounter (Signed)
Cardiac Catheterization scheduled at [facility] for: Wednesday September 20, 2021 2:30 PM Arrival time and place: [place] Entrance A at: 12:30 PM   Nothing to eat after midnight prior to procedure, clear liquids until 5 AM day of procedure.  Medication instructions: -Hold:  Farxiga/aldactone-AM of procedure  -Except hold medications usual morning medications can be taken with sips of water including aspirin 81 mg.  Confirmed patient has responsible adult to drive home post procedure and be with patient first 24 hours after arriving home.  Patient reports no new symptoms concerning for COVID-19 in the past 10 days.  Left message for patient to call back to review procedure instructions.

## 2021-09-20 ENCOUNTER — Ambulatory Visit (HOSPITAL_COMMUNITY)
Admission: RE | Disposition: A | Discharge status: Home / Self Care | Payer: Self-pay | Source: Home / Self Care | Attending: Cardiovascular Disease

## 2021-09-20 ENCOUNTER — Other Ambulatory Visit: Payer: Self-pay

## 2021-09-20 ENCOUNTER — Encounter (HOSPITAL_COMMUNITY): Payer: Self-pay | Admitting: Cardiovascular Disease

## 2021-09-20 ENCOUNTER — Ambulatory Visit (HOSPITAL_COMMUNITY)
Admission: RE | Admit: 2021-09-20 | Discharge: 2021-09-20 | Disposition: A | Discharge status: Home / Self Care | Payer: 59 | Attending: Cardiovascular Disease | Admitting: Cardiovascular Disease

## 2021-09-20 DIAGNOSIS — Z79899 Other long term (current) drug therapy: Secondary | ICD-10-CM | POA: Diagnosis not present

## 2021-09-20 DIAGNOSIS — I25118 Atherosclerotic heart disease of native coronary artery with other forms of angina pectoris: Secondary | ICD-10-CM | POA: Insufficient documentation

## 2021-09-20 DIAGNOSIS — I712 Thoracic aortic aneurysm, without rupture, unspecified: Secondary | ICD-10-CM | POA: Diagnosis present

## 2021-09-20 DIAGNOSIS — E785 Hyperlipidemia, unspecified: Secondary | ICD-10-CM | POA: Diagnosis not present

## 2021-09-20 DIAGNOSIS — I422 Other hypertrophic cardiomyopathy: Secondary | ICD-10-CM | POA: Diagnosis not present

## 2021-09-20 DIAGNOSIS — Z7982 Long term (current) use of aspirin: Secondary | ICD-10-CM | POA: Diagnosis not present

## 2021-09-20 DIAGNOSIS — I340 Nonrheumatic mitral (valve) insufficiency: Secondary | ICD-10-CM | POA: Diagnosis not present

## 2021-09-20 DIAGNOSIS — I110 Hypertensive heart disease with heart failure: Secondary | ICD-10-CM | POA: Insufficient documentation

## 2021-09-20 DIAGNOSIS — F17210 Nicotine dependence, cigarettes, uncomplicated: Secondary | ICD-10-CM | POA: Insufficient documentation

## 2021-09-20 DIAGNOSIS — I208 Other forms of angina pectoris: Secondary | ICD-10-CM | POA: Diagnosis present

## 2021-09-20 DIAGNOSIS — I25119 Atherosclerotic heart disease of native coronary artery with unspecified angina pectoris: Secondary | ICD-10-CM | POA: Diagnosis not present

## 2021-09-20 DIAGNOSIS — I5042 Chronic combined systolic (congestive) and diastolic (congestive) heart failure: Secondary | ICD-10-CM | POA: Diagnosis not present

## 2021-09-20 DIAGNOSIS — I77819 Aortic ectasia, unspecified site: Secondary | ICD-10-CM | POA: Insufficient documentation

## 2021-09-20 DIAGNOSIS — Z95820 Peripheral vascular angioplasty status with implants and grafts: Secondary | ICD-10-CM

## 2021-09-20 DIAGNOSIS — Z955 Presence of coronary angioplasty implant and graft: Secondary | ICD-10-CM | POA: Diagnosis not present

## 2021-09-20 DIAGNOSIS — Z720 Tobacco use: Secondary | ICD-10-CM | POA: Diagnosis present

## 2021-09-20 DIAGNOSIS — I2089 Other forms of angina pectoris: Secondary | ICD-10-CM | POA: Diagnosis present

## 2021-09-20 DIAGNOSIS — I2510 Atherosclerotic heart disease of native coronary artery without angina pectoris: Secondary | ICD-10-CM | POA: Diagnosis present

## 2021-09-20 DIAGNOSIS — I5020 Unspecified systolic (congestive) heart failure: Secondary | ICD-10-CM | POA: Diagnosis present

## 2021-09-20 HISTORY — PX: LEFT HEART CATH AND CORONARY ANGIOGRAPHY: CATH118249

## 2021-09-20 HISTORY — DX: Tobacco use: Z72.0

## 2021-09-20 HISTORY — DX: Atherosclerotic heart disease of native coronary artery without angina pectoris: I25.10

## 2021-09-20 HISTORY — PX: CORONARY STENT INTERVENTION: CATH118234

## 2021-09-20 LAB — POCT ACTIVATED CLOTTING TIME: Activated Clotting Time: 239 seconds

## 2021-09-20 SURGERY — LEFT HEART CATH AND CORONARY ANGIOGRAPHY
Anesthesia: LOCAL

## 2021-09-20 MED ORDER — LABETALOL HCL 5 MG/ML IV SOLN: 10.0000 mg | INTRAVENOUS | Status: DC | PRN

## 2021-09-20 MED ORDER — MIDAZOLAM HCL 2 MG/2ML IJ SOLN
INTRAMUSCULAR | Status: AC
  Filled 2021-09-20: qty 2

## 2021-09-20 MED ORDER — SODIUM CHLORIDE 0.9% FLUSH: 3.0000 mL | INTRAVENOUS | Status: DC | PRN

## 2021-09-20 MED ORDER — ONDANSETRON HCL 4 MG/2ML IJ SOLN: 4.0000 mg | Freq: Four times a day (QID) | INTRAMUSCULAR | Status: DC | PRN

## 2021-09-20 MED ORDER — SODIUM CHLORIDE 0.9% FLUSH: 3.0000 mL | Freq: Two times a day (BID) | INTRAVENOUS | Status: DC

## 2021-09-20 MED ORDER — SODIUM CHLORIDE 0.9 % WEIGHT BASED INFUSION
3.0000 mL/kg/h | INTRAVENOUS | Status: DC
Start: 2021-09-20 — End: 2021-09-20
  Administered 2021-09-20: 3 mL/kg/h via INTRAVENOUS

## 2021-09-20 MED ORDER — ACETAMINOPHEN 325 MG PO TABS: 650.0000 mg | ORAL_TABLET | ORAL | Status: DC | PRN

## 2021-09-20 MED ORDER — SPIRONOLACTONE 25 MG PO TABS
25.0000 mg | ORAL_TABLET | Freq: Every day | ORAL | Status: DC
  Administered 2021-09-20: 25 mg via ORAL
  Filled 2021-09-20: qty 1

## 2021-09-20 MED ORDER — IOHEXOL 350 MG/ML SOLN
INTRAVENOUS | Status: DC | PRN
  Administered 2021-09-20: 140 mL via INTRA_ARTERIAL

## 2021-09-20 MED ORDER — SODIUM CHLORIDE 0.9% FLUSH
3.0000 mL | INTRAVENOUS | Status: DC | PRN
Start: 2021-09-21 — End: 2021-09-21

## 2021-09-20 MED ORDER — ASPIRIN 81 MG PO CHEW: 81.0000 mg | CHEWABLE_TABLET | ORAL | Status: DC

## 2021-09-20 MED ORDER — NITROGLYCERIN 1 MG/10 ML FOR IR/CATH LAB
INTRA_ARTERIAL | Status: DC | PRN
  Administered 2021-09-20 (×2): 100 ug via INTRACORONARY

## 2021-09-20 MED ORDER — TICAGRELOR 90 MG PO TABS: ORAL_TABLET | ORAL | Status: DC | PRN

## 2021-09-20 MED ORDER — NITROGLYCERIN 1 MG/10 ML FOR IR/CATH LAB
INTRA_ARTERIAL | Status: AC
  Filled 2021-09-20: qty 10

## 2021-09-20 MED ORDER — HEPARIN SODIUM (PORCINE) 1000 UNIT/ML IJ SOLN
INTRAMUSCULAR | Status: DC | PRN
  Administered 2021-09-20: 4000 [IU] via INTRAVENOUS
  Administered 2021-09-20: 5000 [IU] via INTRAVENOUS
  Administered 2021-09-20: 3000 [IU] via INTRAVENOUS

## 2021-09-20 MED ORDER — FENTANYL CITRATE (PF) 100 MCG/2ML IJ SOLN
INTRAMUSCULAR | Status: AC
  Filled 2021-09-20: qty 2

## 2021-09-20 MED ORDER — ATORVASTATIN CALCIUM 40 MG PO TABS
40.0000 mg | ORAL_TABLET | Freq: Every evening | ORAL | Status: DC
  Administered 2021-09-20: 40 mg via ORAL
  Filled 2021-09-20: qty 1

## 2021-09-20 MED ORDER — CLOPIDOGREL BISULFATE 75 MG PO TABS: 75.0000 mg | ORAL_TABLET | Freq: Every day | ORAL | 11 refills | Status: DC

## 2021-09-20 MED ORDER — CARVEDILOL 25 MG PO TABS
37.5000 mg | ORAL_TABLET | Freq: Two times a day (BID) | ORAL | Status: DC
  Administered 2021-09-20: 37.5 mg via ORAL
  Filled 2021-09-20: qty 1

## 2021-09-20 MED ORDER — ISOSORBIDE MONONITRATE ER 30 MG PO TB24
15.0000 mg | ORAL_TABLET | Freq: Every day | ORAL | Status: DC
  Administered 2021-09-20: 15 mg via ORAL
  Filled 2021-09-20: qty 1

## 2021-09-20 MED ORDER — HYDRALAZINE HCL 25 MG PO TABS
25.0000 mg | ORAL_TABLET | Freq: Three times a day (TID) | ORAL | Status: DC
  Administered 2021-09-20: 25 mg via ORAL
  Filled 2021-09-20: qty 1

## 2021-09-20 MED ORDER — VERAPAMIL HCL 2.5 MG/ML IV SOLN
INTRAVENOUS | Status: DC | PRN
  Administered 2021-09-20: 10 mL via INTRA_ARTERIAL

## 2021-09-20 MED ORDER — CLOPIDOGREL BISULFATE 75 MG PO TABS: 75.0000 mg | ORAL_TABLET | Freq: Every day | ORAL | Status: DC

## 2021-09-20 MED ORDER — HEPARIN SODIUM (PORCINE) 1000 UNIT/ML IJ SOLN
INTRAMUSCULAR | Status: AC
  Filled 2021-09-20: qty 10

## 2021-09-20 MED ORDER — HEPARIN (PORCINE) IN NACL 1000-0.9 UT/500ML-% IV SOLN
INTRAVENOUS | Status: DC | PRN
  Administered 2021-09-20 (×2): 500 mL

## 2021-09-20 MED ORDER — SODIUM CHLORIDE 0.9% FLUSH
3.0000 mL | Freq: Two times a day (BID) | INTRAVENOUS | Status: DC
Start: 2021-09-21 — End: 2021-09-21

## 2021-09-20 MED ORDER — FAMOTIDINE IN NACL 20-0.9 MG/50ML-% IV SOLN
INTRAVENOUS | Status: AC
  Filled 2021-09-20: qty 50

## 2021-09-20 MED ORDER — CLOPIDOGREL BISULFATE 300 MG PO TABS
ORAL_TABLET | ORAL | Status: DC | PRN
  Administered 2021-09-20: 600 mg via ORAL

## 2021-09-20 MED ORDER — VERAPAMIL HCL 2.5 MG/ML IV SOLN
INTRAVENOUS | Status: AC
  Filled 2021-09-20: qty 2

## 2021-09-20 MED ORDER — DAPAGLIFLOZIN PROPANEDIOL 10 MG PO TABS
10.0000 mg | ORAL_TABLET | Freq: Every day | ORAL | Status: DC
  Administered 2021-09-20: 10 mg via ORAL
  Filled 2021-09-20: qty 1

## 2021-09-20 MED ORDER — FENTANYL CITRATE (PF) 100 MCG/2ML IJ SOLN
INTRAMUSCULAR | Status: DC | PRN
  Administered 2021-09-20 (×2): 25 ug via INTRAVENOUS

## 2021-09-20 MED ORDER — FAMOTIDINE IN NACL 20-0.9 MG/50ML-% IV SOLN
INTRAVENOUS | Status: AC | PRN
  Administered 2021-09-20: 20 mg via INTRAVENOUS

## 2021-09-20 MED ORDER — SODIUM CHLORIDE 0.9 % WEIGHT BASED INFUSION: 1.0000 mL/kg/h | INTRAVENOUS | Status: DC

## 2021-09-20 MED ORDER — HYDRALAZINE HCL 20 MG/ML IJ SOLN: 10.0000 mg | INTRAMUSCULAR | Status: DC | PRN

## 2021-09-20 MED ORDER — SODIUM CHLORIDE 0.9 % IV SOLN
250.0000 mL | INTRAVENOUS | Status: DC | PRN
Start: 2021-09-21 — End: 2021-09-21

## 2021-09-20 MED ORDER — CLOPIDOGREL BISULFATE 75 MG PO TABS: 75.0000 mg | ORAL_TABLET | Freq: Every day | ORAL | 0 refills | Status: DC

## 2021-09-20 MED ORDER — ASPIRIN 81 MG PO TBEC
81.0000 mg | DELAYED_RELEASE_TABLET | Freq: Every evening | ORAL | Status: DC
  Administered 2021-09-20: 81 mg via ORAL
  Filled 2021-09-20: qty 1

## 2021-09-20 MED ORDER — SACUBITRIL-VALSARTAN 97-103 MG PO TABS
1.0000 | ORAL_TABLET | Freq: Two times a day (BID) | ORAL | Status: DC
  Administered 2021-09-20: 1 via ORAL
  Filled 2021-09-20: qty 1

## 2021-09-20 MED ORDER — SODIUM CHLORIDE 0.9 % WEIGHT BASED INFUSION
1.0000 mL/kg/h | INTRAVENOUS | Status: DC
  Administered 2021-09-20: 1 mL/kg/h via INTRAVENOUS

## 2021-09-20 MED ORDER — HEPARIN (PORCINE) IN NACL 1000-0.9 UT/500ML-% IV SOLN
INTRAVENOUS | Status: AC
  Filled 2021-09-20: qty 1000

## 2021-09-20 MED ORDER — SODIUM CHLORIDE 0.9 % IV SOLN
250.0000 mL | INTRAVENOUS | Status: DC | PRN
  Administered 2021-09-20: 250 mL via INTRAVENOUS

## 2021-09-20 MED ORDER — LIDOCAINE HCL (PF) 1 % IJ SOLN
INTRAMUSCULAR | Status: AC
  Filled 2021-09-20: qty 30

## 2021-09-20 MED ORDER — MIDAZOLAM HCL 2 MG/2ML IJ SOLN
INTRAMUSCULAR | Status: DC | PRN
  Administered 2021-09-20 (×2): 2 mg via INTRAVENOUS

## 2021-09-20 SURGICAL SUPPLY — 21 items
BALL SAPPHIRE NC24 3.75X15 (BALLOONS) ×2
BALLN SAPPHIRE 3.0X15 (BALLOONS) ×2
BALLOON SAPPHIRE 3.0X15 (BALLOONS) IMPLANT
BALLOON SAPPHIRE NC24 3.75X15 (BALLOONS) IMPLANT
BAND ZEPHYR COMPRESS 30 LONG (HEMOSTASIS) ×1 IMPLANT
CATH 5FR JL3.5 JR4 ANG PIG MP (CATHETERS) ×1 IMPLANT
CATH INFINITI 5FR JL5 (CATHETERS) ×1 IMPLANT
CATH LAUNCHER 6FR JR4 (CATHETERS) ×1 IMPLANT
DEVICE RAD COMP TR BAND LRG (VASCULAR PRODUCTS) ×1 IMPLANT
GLIDESHEATH SLEND SS 6F .021 (SHEATH) ×1 IMPLANT
GUIDEWIRE INQWIRE 1.5J.035X260 (WIRE) IMPLANT
GUIDEWIRE PRESSURE X 175 (WIRE) ×1 IMPLANT
INQWIRE 1.5J .035X260CM (WIRE) ×2
KIT ENCORE 26 ADVANTAGE (KITS) ×1 IMPLANT
KIT HEART LEFT (KITS) ×2 IMPLANT
PACK CARDIAC CATHETERIZATION (CUSTOM PROCEDURE TRAY) ×2 IMPLANT
STENT SYNERGY XD 3.50X24 (Permanent Stent) IMPLANT
SYNERGY XD 3.50X24 (Permanent Stent) ×2 IMPLANT
TRANSDUCER W/STOPCOCK (MISCELLANEOUS) ×2 IMPLANT
TUBING CIL FLEX 10 FLL-RA (TUBING) ×2 IMPLANT
WIRE COUGAR XT STRL 190CM (WIRE) ×1 IMPLANT

## 2021-09-20 NOTE — Discharge Summary (Signed)
Discharge Summary    Patient ID: Thomas Spears MRN: [id number]; DOB: [birth year]  Admit date: 09/20/2021 Discharge date: 09/20/2021  PCP:  Ezequiel Essex, MD   [facility] Providers Cardiologist:  Freada Bergeron, MD        Discharge Diagnoses    Principal Problem:   Angina of effort [facility]) Active Problems:   HFrEF (heart failure with reduced ejection fraction) [facility])   Thoracic aortic aneurysm ([place])   CAD (coronary artery disease), native coronary artery   S/P angioplasty with stent 09/20/21 with PCI with DES to distal RCA   HLD (hyperlipidemia)   Tobacco use    Diagnostic Studies/Procedures    Cardiac cath and PCI 09/20/21 Conclusion  1.  Patent left main, LAD, and left circumflex, with mild nonobstructive plaquing 2.  Severe distal RCA stenosis with positive RFR of 0.78, treated successfully with PCI using a 3.5 x 24 mm Synergy DES deployed over the distal RCA and extending into the posterolateral branch   Recommendations: Continued risk reduction measures and GDMT for heart failure, aspirin and clopidogrel x 6 months without interruption, okay for discharge this evening as long as criteria met.  Diagnostic Dominance: Right  Intervention      History of Present Illness     Thomas Spears is a 46 y.o. male with hx of systolic heart failure with LVEF 25% 04/2020 that improved to 60-65% in 02/2021, HCM on CMR on 07/2020, HTN, mild-to-moderate MR, severe aortic dilation measuring 61m and seen in clinic 09/18/21 by Dr. PJohney Frame  It was noted he had been seen in ER twice in last month for chest pain.  Troponins 30-40s   He underwent CTA dissection study that showed possible small dissection flap above the coronary cusp. He was recommended for a gated CT chest that showed linear defect along the aortic root but this is unchanged and possible PE in the RUL, but d-dimer was normal with greater suspicion that this was related to a pulmonary nodule. Dr. HRoxan Hockey was consulted who recommended repeat CTA which was negative for any dissection or PE. He was discharged home  In office he continued with episodic chest discomfort though no SOB, edema, orthopnea or PND.  BP controlled. His HF was stable and with more freq episodes of chest pain it was felt cardiac cath was warranted and probable PCI of RCA.  He is on ASA 81 mg, statin and BB.   His ascending aortic dilatation will be followed by Dr. VDarcey Nora   Pt was scheduled for cardiac cath today as outpt.  He underwent cath and PCI to distal RCI as above.   Hospital Course     Consultants: none   Pt did well in the cath lab.  He was asked to stay overnight due to the lateness of completion of PCI but pt preferred to be discharged.    Will arrange a follow up appt TOC in 7-10 days.  Continue statin, BB, imdur entresto, spironolactone hydralazine and in addition to ASA he will be on plavix.  He has been asked to stop tobacco.    Pt is stable and wrist with band in place. No chest pain and no SOB.  We discussed cardiac rehab and plavix and importance.  MD on call will check wrist post removal of band.   Otherwise when that is completed pt is stable for discharge.    Did the patient have an acute coronary syndrome (MI, NSTEMI, STEMI, etc) this admission?:  No  Did the patient have a percutaneous coronary intervention (stent / angioplasty)?:  Yes.     Cath/PCI Registry Performance & Quality Measures: Aspirin prescribed? - Yes ADP Receptor Inhibitor (Plavix/Clopidogrel, Brilinta/Ticagrelor or Effient/Prasugrel) prescribed (includes medically managed patients)? - Yes High Intensity Statin (Lipitor 40-46m or Crestor 20-458m prescribed? - Yes For EF <40%, was ACEI/ARB prescribed? - Yes For EF <40%, Aldosterone Antagonist (Spironolactone or Eplerenone) prescribed? - Yes Cardiac Rehab Phase II ordered? - Yes       The patient will be scheduled for a TOC follow up appointment  in 7-10 days.  A [facility] at the office where the patient should be seen for follow up.  _____________  Discharge Vitals Blood pressure (!) 151/89, pulse 65, temperature 97.8 F (36.6 C), resp. rate 19, height 5' 9" (1.753 m), weight 102.1 kg, SpO2 98 %.  Filed Weights   09/20/21 1240 09/20/21 1310  Weight: 102.1 kg 102.1 kg    Labs & Radiologic Studies    CBC Recent Labs    09/18/21 1041  WBC 11.2*  NEUTROABS 8.1*  HGB 14.9  HCT 44.3  MCV 88  PLT 26314 Basic Metabolic Panel Recent Labs    09/18/21 1041  NA 137  K 4.9  CL 104  CO2 28  GLUCOSE 87  BUN 19  CREATININE 1.36*  CALCIUM 9.7   Liver Function Tests No results for input(s): "AST", "ALT", "ALKPHOS", "BILITOT", "PROT", "ALBUMIN" in the last 72 hours. No results for input(s): "LIPASE", "AMYLASE" in the last 72 hours. High Sensitivity Troponin:   Recent Labs  Lab 09/04/21 1627 09/04/21 1829 09/06/21 0743 09/06/21 1616  TROPONINIHS 39* 44* 46* 37*    BNP Invalid input(s): "POCBNP" D-Dimer No results for input(s): "DDIMER" in the last 72 hours. Hemoglobin A1C No results for input(s): "HGBA1C" in the last 72 hours. Fasting Lipid Panel No results for input(s): "CHOL", "HDL", "LDLCALC", "TRIG", "CHOLHDL", "LDLDIRECT" in the last 72 hours. Thyroid Function Tests No results for input(s): "TSH", "T4TOTAL", "T3FREE", "THYROIDAB" in the last 72 hours.  Invalid input(s): "FREET3" _____________  CARDIAC CATHETERIZATION  Result Date: 09/20/2021 1.  Patent left main, LAD, and left circumflex, with mild nonobstructive plaquing 2.  Severe distal RCA stenosis with positive RFR of 0.78, treated successfully with PCI using a 3.5 x 24 mm Synergy DES deployed over the distal RCA and extending into the posterolateral branch Recommendations: Continued risk reduction measures and GDMT for heart failure, aspirin and clopidogrel x 6 months without interruption, okay for  discharge this evening as long as criteria met.   CT ANGIO CHEST AORTA W/ & OR WO/CM & GATING ([place] ONLY)  Result Date: 09/06/2021 CLINICAL DATA:  Chest pain or back pain, aortic dissection suspected. Pulmonary embolism (PE) suspected, high prob EXAM: CT ANGIOGRAPHY CHEST WITH CONTRAST TECHNIQUE: Multidetector CT imaging of the chest was performed using the standard protocol during bolus administration of intravenous contrast. Subsequently, repeat imaging was performed utilizing the cardiac gated protocol during bolus administration of a second dose of intravenous contrast. Multiplanar CT image reconstructions and MIPs were obtained to evaluate the vascular anatomy. RADIATION DOSE REDUCTION: This exam was performed according to the departmental dose-optimization program which includes automated exposure control, adjustment of the mA and/or kV according to patient size and/or use of iterative reconstruction technique. CONTRAST:  20064mMNIPAQUE IOHEXOL 350 MG/ML SOLN COMPARISON:  09/04/2021 FINDINGS: Cardiovascular: There is preferential opacification of the thoracic aorta. The aortic valve is  trileaflet. The thoracic aorta is normal in course and caliber without evidence of intramural hematoma or dissection. The thoracic aorta is mildly dilated measuring 5.7 by 5.7 cm at the sinuses of Valsalva (image # 81/14 and 89/9). The ascending aorta is mildly dilated measuring 4.1 x 4.2 cm (image # 81/14 and 107/15). The descending thoracic aorta is mildly dilated measuring 3.2 x 3.0 cm just beyond the takeoff of the left subclavian artery (image # 123/14 and 132/15). No significant atherosclerotic plaque identified. The arch vasculature is widely patent proximally and demonstrates normal anatomic configuration. Global cardiac size is within normal limits, however, there is left ventricular hypertrophy again noted with asymmetric septal hypertrophy noted. This results in marked narrowing of the left ventricular  outflow tract with a cross-sectional area of the a outflow tract approximately 7 mm x 25 mm best seen on coronal reformat # 87 and sagittal reformat # 123. This was better assessed on MRI examination of 08/19/2020. Moderate coronary artery calcification. No pericardial effusion. Central pulmonary arteries are of normal caliber. There is adequate opacification of the pulmonary arterial tree and no intraluminal filling defect is identified through the segmental level to suggest acute pulmonary embolism. Mediastinum/Nodes: No enlarged mediastinal, hilar, or axillary lymph nodes. Thyroid gland, trachea, and esophagus demonstrate no significant findings. Lungs/Pleura: Lungs are clear. No pleural effusion or pneumothorax. Upper Abdomen: No acute abnormality. Musculoskeletal: No chest wall abnormality. No acute or significant osseous findings. Review of the MIP images confirms the above findings. IMPRESSION: 1. No pulmonary embolism. 2. Mild aneurysmal dilation of the thoracic aorta at the sinuses of Valsalva, ascending aorta, and descending thoracic aorta. Maximal diameter 5.7 cm at the sinuses of Valsalva. No evidence of intramural hematoma or dissection. 3. 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 This was better assessed on MRI examination of 08/19/2020. 4. Moderate coronary artery calcification. Electronically Signed   By: Fidela Salisbury M.D.   On: 09/06/2021 21:08   DG Chest 2 View  Result Date: 09/06/2021 CLINICAL DATA:  Chest pain.  Chest tightness. EXAM: CHEST - 2 VIEW COMPARISON:  Chest XR, 08/05/2021.  CT chest, 08/05/2021. FINDINGS: Cardiomediastinal silhouette is within normal limits. Lungs are well inflated. No focal consolidation or mass. No pleural effusion or pneumothorax. No acute displaced fracture. IMPRESSION: No acute cardiopulmonary process. Electronically Signed   By: Michaelle Birks M.D.   On: 09/06/2021  08:01   CT ANGIO CHEST AORTA W/ & OR WO/CM & GATING ([place] ONLY)  Result Date: 09/04/2021 CLINICAL DATA:  Thoracic aortic dissection, follow up.  Chest pain EXAM: CT ANGIOGRAPHY CHEST WITH CONTRAST TECHNIQUE: Multidetector CT imaging of the chest was performed using the standard protocol during bolus administration of intravenous contrast. Multiplanar CT image reconstructions and MIPs were obtained to evaluate the vascular anatomy. RADIATION DOSE REDUCTION: This exam was performed according to the departmental dose-optimization program which includes automated exposure control, adjustment of the mA and/or kV according to patient size and/or use of iterative reconstruction technique. CONTRAST:  126m OMNIPAQUE IOHEXOL 350 MG/ML SOLN COMPARISON:  09/04/2021, 05/22/2020 FINDINGS: Cardiovascular: Concern for small pulmonary embolus in the right upper lobe seen on image one hundred forty-four of series 8. 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 Ascending thoracic aorta measures 4.1 cm on coronal image 68 compared to 4.1 cm previously. Previously seen linear filling defect in the aortic  root is again noted. Again, focal dissection cannot be completely excluded. This is unchanged since prior study and dating back to 05/22/2020. Scattered calcifications in the coronary arteries. Mediastinum/Nodes: No mediastinal, hilar, or axillary adenopathy. Trachea and esophagus are unremarkable. Thyroid unremarkable. Lungs/Pleura: No confluent opacities or effusions. Upper Abdomen: No acute findings Musculoskeletal: Chest wall soft tissues are unremarkable. No acute bony abnormality. Review of the MIP images confirms the above findings. IMPRESSION: Small filling defect suspected in a right upper lobe pulmonary arterial branch concerning for small right upper lobe pulmonary embolus. Stable aneurysmal dilatation of the aortic root and  ascending thoracic aorta measuring maximally 4.9 cm at the sinuses of Valsalva. Linear filling defect is again noted in the aortic root, similar to prior studies dating back to 05/22/2020. This is of unknown etiology. Again, this could reflect a focal dissection but is unchanged. Scattered coronary artery calcifications. Electronically Signed   By: Rolm Baptise M.D.   On: 09/04/2021 23:32   CT Angio Chest/Abd/Pel for Dissection W and/or Wo Contrast  Result Date: 09/04/2021 CLINICAL DATA:  Chest pain since 4:45 a.m., history of known aortic aneurysm in a 46 year old male. EXAM: CT ANGIOGRAPHY CHEST, ABDOMEN AND PELVIS TECHNIQUE: Non-contrast CT of the chest was initially obtained. Multidetector CT imaging through the chest, abdomen and pelvis was performed using the standard protocol during bolus administration of intravenous contrast. Multiplanar reconstructed images and MIPs were obtained and reviewed to evaluate the vascular anatomy. RADIATION DOSE REDUCTION: This exam was performed according to the departmental dose-optimization program which includes automated exposure control, adjustment of the mA and/or kV according to patient size and/or use of iterative reconstruction technique. CONTRAST:  100 mL Omnipaque 350 COMPARISON:  June 01, 2021. FINDINGS: CTA CHEST FINDINGS Cardiovascular: Noncontrast appearance of the thoracic aorta without signs of intramural hematoma. No hemopericardium. Calcified coronary artery disease. Aorta with standard three-vessel branching pattern. No signs of acute aortic process. Redemonstration of sinus of Valsalva aneurysm. Ascending thoracic aortic caliber at 4.1 cm in the coronal plane within 1-2 mm of previous measurement. Aortic sinus dilated to 4.8 cm also measured in the coronal plane unchanged as far back as May 22, 2020. No stranding adjacent to the thoracic aorta. Just above the LEFT coronary cusp is an area of irregularity with focal potential contour bulging along the  medial wall of the thoracic aorta best seen on image 63/9. The possibility of small dissection flap is illustrated on this image. There is evidence of motion artifact but this is above this level on this non gated assessment. Pulmonary vasculature with normal appearance. No signs of pulmonary embolism. Heart size moderately enlarged to markedly enlarged as before. Mediastinum/Nodes: Esophagus is normal. No signs of adenopathy. Lungs/Pleura: No effusion. No consolidative changes. No suspicious nodules. Airways are patent. Musculoskeletal: See below for full musculoskeletal details. Review of the MIP images confirms the above findings. CTA ABDOMEN AND PELVIS FINDINGS VASCULAR Aorta: 2.9 cm infrarenal abdominal aortic aneurysm just above the iliac bifurcation. Celiac: Patent without evidence of aneurysm, dissection, vasculitis or significant stenosis. SMA: Origin is widely patent. There is some motion related artifact and beam hardening artifact leading to some distortion of the vessel best appreciated in the coronal plane. No surrounding stranding. There is some soft plaque in the vessel lumen. There may be mild proximal dilation of the vessel. Renals: Single renal arteries bilaterally. Atherosclerotic plaque in the RIGHT mid renal artery with moderate narrowing of the vessel in the midportion of the vessel. IMA: Mild narrowing at the origin,  patent distally with good opacification and no aneurysmal dilation. Inflow: Patent without evidence of aneurysm, dissection, vasculitis or significant stenosis. Veins: No obvious venous abnormality within the limitations of this arterial phase study. Outflow: Patent outflow into the upper thigh. Review of the MIP images confirms the above findings. NON-VASCULAR Hepatobiliary: Normal contour and size. No pericholecystic stranding or biliary duct dilation. Pancreas: Normal, without mass, inflammation or ductal dilatation. Spleen: Normal. Adrenals/Urinary Tract: Adrenal glands are  normal. Symmetric renal enhancement without hydronephrosis. Forty-two Hounsfield unit lesion arises from the upper pole the LEFT kidney posteriorly measuring 2.9 x 2.5 cm this is new compared to March of 2017 and measured 41 Hounsfield units on the noncontrast study from June 21, 2021. Minimal surrounding stranding of this lesion is unchanged. Urinary bladder is collapsed without adjacent stranding. No suspicious renal lesions on the RIGHT. Stomach/Bowel: Normal appendix. Stomach without adjacent stranding. Small bowel is of normal caliber. Appendix is normal. Colon without signs of adjacent stranding. Lymphatic: No adenopathy. Reproductive: Unremarkable by CT. Other: No ascites. Musculoskeletal: Spinal degenerative changes without acute or destructive bone finding. Review of the MIP images confirms the above findings. IMPRESSION: 1. Stable dilation of the aortic sinus approximately 4.8 cm with stable mild aneurysm of the ascending thoracic aorta. 2. Potential small dissection flap just above the LEFT coronary cusp. This is below an area that is clearly affected by motion on this non gated assessment but cannot be clearly attributed to motion at the level of the abnormality. Motion artifact is still a possibility on this non gated scan. Would suggest cardiothoracic consult and follow-up gated CT angiography of the chest. 3. Cardiomegaly. 4. 2.9 cm infrarenal abdominal aortic aneurysm. Recommend follow-up every 5 years. Reference: J Am Coll Radiol 0233;43:[id number]. 5. Moderate RIGHT renal artery stenosis. 6. Findings suspicious for solid renal neoplasm. Follow-up CT renal protocol or MRI with and without contrast is suggested when the patient is able, preferably as an outpatient outside of the acute setting. These results were called by telephone at the time of interpretation on 09/04/2021 at 6:53 pm to provider Dr. Roslynn Amble, who verbally acknowledged these results. Electronically Signed   By: Zetta Bills M.D.   On:  09/04/2021 18:58   DG Chest 2 View  Result Date: 09/04/2021 CLINICAL DATA:  A 46 year old male presents with history of chest pain. EXAM: CHEST - 2 VIEW COMPARISON:  April 12, 2021. FINDINGS: EKG leads stickers project over the chest. Moderate cardiomegaly similar to previous imaging. No lobar consolidation. No sign of pleural effusion or pneumothorax. On limited assessment there is no acute skeletal process. IMPRESSION: 1. Moderate cardiomegaly, similar to previous imaging. 2. No acute cardiopulmonary disease. Electronically Signed   By: Zetta Bills M.D.   On: 09/04/2021 15:04    Disposition   Pt is being discharged home today in good condition.  Follow-up Plans & Appointments   Call [facility] at [phone] if any bleeding, swelling or drainage at cath site.  May shower, no tub baths for 48 hours for groin sticks. No lifting over 5 pounds for 3 days.  No Driving for 3 days  Heart Healthy diet  No work for 3-5 days to allow healing of cath site.   DO not miss asprin and plavix these medications are to keep your stent open.  Stopping could cause a heart attack.     Follow-up Information     Freada Bergeron, MD Follow up.   Specialties: Cardiology, Radiology Why: the office should call  tomorrow to arrange a follow up appt in next 7-10 days  if you have not heard from them by Friday please call the office to schedule. Contact information: [address] [place] [address] [phone]         Dahlia Byes, MD Follow up on 10/09/2021.   Specialty: Cardiothoracic Surgery Why: at 9:00 am Contact information: [address] [place] [address] [phone]                Discharge Instructions     AMB Referral to Cardiac Rehabilitation - Phase II   Complete by: As directed    Diagnosis: Coronary Stents   After initial evaluation and assessments completed: Virtual Based Care may be provided  alone or in conjunction with Phase 2 Cardiac Rehab based on patient barriers.: Yes       Discharge Medications   Allergies as of 09/20/2021       Reactions   Mushroom Extract Complex Anaphylaxis        Medication List     TAKE these medications    acetaminophen 500 MG tablet Commonly known as: TYLENOL Take 2 tablets (1,000 mg total) by mouth every 6 (six) hours as needed (pain). What changed: reasons to take this   aspirin EC 81 MG tablet Take 1 tablet (81 mg total) by mouth every evening. Swallow whole.   atorvastatin 40 MG tablet Commonly known as: LIPITOR TAKE 1 TABLET(40 MG) BY MOUTH DAILY What changed: See the new instructions.   benzonatate 100 MG capsule Commonly known as: TESSALON Take 1 capsule (100 mg total) by mouth every 8 (eight) hours.   carvedilol 25 MG tablet Commonly known as: COREG Take 1.5 tablets (37.5 mg total) by mouth 2 (two) times daily.   clopidogrel 75 MG tablet Commonly known as: Plavix Take 1 tablet (75 mg total) by mouth daily.   dapagliflozin propanediol 10 MG Tabs tablet Commonly known as: FARXIGA Take 1 tablet (10 mg total) by mouth daily.   Entresto 97-103 MG Generic drug: sacubitril-valsartan Take 1 tablet by mouth 2 (two) times daily.   hydrALAZINE 25 MG tablet Commonly known as: APRESOLINE Take 1 tablet (25 mg total) by mouth 3 (three) times daily.   isosorbide mononitrate 30 MG 24 hr tablet Commonly known as: IMDUR Take 0.5 tablets (15 mg total) by mouth daily.   spironolactone 25 MG tablet Commonly known as: ALDACTONE Take 1 tablet (25 mg total) by mouth daily.           Outstanding Labs/Studies   BMP   Duration of Discharge Encounter   Greater than 30 minutes including physician time.  Signed, Cecilie Kicks, NP 09/20/2021, 7:00 PM

## 2021-09-20 NOTE — Progress Notes (Signed)
Pt's TR band has been removed, Cosiano, MD came to the bedside to assess site. Level 0, no bleeding or hematoma noted, post-cath fluids stopped & pt educated on discharge paperwork.  Elaina Hoops, RN

## 2021-09-20 NOTE — Interval H&P Note (Signed)
Cath Lab Visit (complete for each Cath Lab visit)  Clinical Evaluation Leading to the Procedure:   ACS: No.  Non-ACS:    Anginal Classification: CCS II  Anti-ischemic medical therapy: Minimal Therapy (1 class of medications)  Non-Invasive Test Results: Intermediate-risk stress test findings: cardiac mortality 1-3%/year  Prior CABG: No previous CABG      History and Physical Interval Note:  09/20/2021 4:08 PM  Thomas Spears  has presented today for surgery, with the diagnosis of chest pain.  The various methods of treatment have been discussed with the patient and family. After consideration of risks, benefits and other options for treatment, the patient has consented to  Procedure(s): LEFT HEART CATH AND CORONARY ANGIOGRAPHY (N/A) as a surgical intervention.  The patient's history has been reviewed, patient examined, no change in status, stable for surgery.  I have reviewed the patient's chart and labs.  Questions were answered to the patient's satisfaction.     Thomas Spears

## 2021-09-20 NOTE — Discharge Instructions (Signed)
Call [facility] at [phone] if any bleeding, swelling or drainage at cath site.  May shower, no tub baths for 48 hours for groin sticks. No lifting over 5 pounds for 3 days.  No Driving for 3 days  Heart Healthy diet  No work for 3-5 days to allow healing of cath site.   DO not miss asprin and plavix these medications are to keep your stent open.  Stopping could cause a heart attack.

## 2021-09-21 ENCOUNTER — Telehealth: Payer: Self-pay | Admitting: Cardiology

## 2021-09-21 ENCOUNTER — Encounter (HOSPITAL_COMMUNITY): Payer: Self-pay | Admitting: Cardiovascular Disease

## 2021-09-21 ENCOUNTER — Telehealth (HOSPITAL_COMMUNITY): Payer: Self-pay

## 2021-09-21 MED FILL — Lidocaine HCl Local Preservative Free (PF) Inj 1%: INTRAMUSCULAR | Qty: 30 | Status: AC

## 2021-09-21 NOTE — Telephone Encounter (Signed)
TOC per Cecilie Kicks, NP scheduled for 09/29/21 at 8:25am with Melina Copa, PA.

## 2021-09-21 NOTE — Telephone Encounter (Signed)
Patient states he was sleeping and missed 2 calls from our office.  Upon chart review, 2 telephone notes from today with messages left for patient documented.  Will route to callers to return call to patient.

## 2021-09-21 NOTE — Telephone Encounter (Signed)
**Note De-Identified Thomas Spears Obfuscation** See TOC Call from today.

## 2021-09-21 NOTE — Telephone Encounter (Signed)
Patient was returning phone call 

## 2021-09-21 NOTE — Telephone Encounter (Signed)
**Note De-Identified Thomas Spears Obfuscation** Transition Care Management Unsuccessful Follow-up Telephone Call  Date of discharge and from where:  09/20/2021 from [facility]  Attempts:  1st Attempt  Reason for unsuccessful TCM follow-up call:  Left voice message

## 2021-09-21 NOTE — Telephone Encounter (Signed)
**Note De-Identified Cordarrel Stiefel Obfuscation** Patient contacted regarding discharge from [facility] on 09/20/2021.  Patient understands to follow up with provider Melina Copa, PA-c on 09/29/2021 at 8:25 at [address] [place] in [place], [place] [address]. Patient understands discharge instructions? Yes Patient understands medications and regiment? Yes Patient understands to bring all medications to this visit? Yes  Ask patient:  Are you enrolled in My Chart: Yes  The pt reports that he is doing well today and is without c/o CP/discomfort, SOB, nausea, diaphoresis, dizziness, or lightheadedness.  He states that he has not removed the bandage from his cath site yet but is having no pain or any other issues at this time. He is aware of the s/s of infection and is aware to call us if he has any redness, streaking, drainage, fever, or discomfort at his cath site or if he has any questions or concerns.  He thanked me for my call.

## 2021-09-25 ENCOUNTER — Encounter: Payer: Self-pay | Admitting: Physician Assistant

## 2021-09-25 MED ORDER — ATORVASTATIN CALCIUM 40 MG PO TABS: 40.0000 mg | ORAL_TABLET | Freq: Every evening | ORAL | 3 refills | Status: DC

## 2021-09-25 NOTE — Progress Notes (Addendum)
Cardiology Office Note    Date:  09/29/2021   ID:  Thomas Spears, DOB 1975-05-14, MRN [id number]  PCP:  Ezequiel Essex, MD  Cardiologist:  Freada Bergeron, MD  Electrophysiologist:  None   Chief Complaint: f/u cath  History of Present Illness:   Thomas Spears is a 46 y.o. male with history of NICM/chronic HFrEF with improved LV function, hypertrophic cardiomyopathy, mitral regurgitation, aortic regurgitation, thoracic aortic aneurysm, CAD s/p DES to RCA 08/2021, prior small pericardial effusion (not seen on more recent studies), NSVT, PACs, PVCs, Mobitz 1 AVB by monitor 2022, tobacco abuse, former heavy ETOH use, CKD stage 2 by labs who is seen for cardiac follow-up.  He originally was hospitalized in 04/2020 with hypertensive crisis and found to have elevated troponin and EF of 25%. Prior to completion of workup, the patient left AMA. Around that time he was not taking his medications regularly and had been drinking energy drinks and smoking. He established care with Dr. Johney Frame 06/2020 who ordered stress MRI showing basal to mid inferior stress perfusion defect consistent with ischemia, EF 34%, asymmetric LV hypertrophy measuring up to 39 mL basal septum differential including hypertrophic cardiomyopathy versus hypertensive heart disease, no evidence of cardiac amyloidosis, moderate AI, mild-moderate MR, 37m ascending TAA. He subsequently underwent cath 08/2020 which showed diffuse mild-moderate MVCAD but no obvious culprit, +moderately elevated LVEDP. Event monitor 08/2020 showed NSR average HR 79bpm, range 34-169, 3 runs NSVT (max 8 beats), rare PACs/PVCs, Mobitz type 1 AV block during sleep, patient triggered events correlated with NSR. Last echo 02/2021 showed EF 60-65%, severe LVH, G2DD, normal RV, mild MR, mild AI, moderate dilation of aorta. In July 2023 he was seen for post-ER follow-up. He had 2 ER visits for chest pain and low troponin elevations in the 30s-40s. He underwent CTA  dissection study that showed possible small dissection flap above the coronary cusp. He was recommended for a gated CT chest that showed linear defect along the aortic root but this is unchanged and possible PE in the RUL, but d-dimer was normal with greater suspicion that this was related to a pulmonary nodule. Dr. HRoxan Hockeywas consulted who recommended repeat CTA which was negative for any dissection or PE. This showed 5.7x5.7cm dilation at the sinuses of Valsalva and 4.1x4.2cm at the ascending aorta. At visit 09/18/21, Dr. PJohney Framerecommended proceeding with outpatient cardiac cath for evaluation. She otherwise referred him to genetics and recommended 6 month follow-up. His cardiac cath showed severe distal RCA stenosis with positive RFR of 0.78 treated with DES; otherwise with patent left main, LAD, and left circumflex, with mild nonobstructive plaquing.  He is seen for follow-up today doing well. He does report that last week while working at work in [employer] claims [employer] he stood up quickly and felt very lightheaded and had to sit down. His shop can get very hot without AC in the summertime so he went to the breakroom where it was cool and felt better quickly. He has not had any recurrent symptoms since then. No CP or SOB. Initial BP 124/64 by CMA but not convinced of accuracy, personal recheck by me 104/80 equal on both sides. He did not yet take his morning medicines today.   Labwork independently reviewed: 08/2021 WBC 11.2, Hgb 14.9, plt 260, K 4.9, Cr 1.36, d-dimer neg, peak troponin 44, albumin 3.3, AST/ALT ok  11/2020 LDL 64, trig 154  Cardiology Studies:   Studies reviewed are outlined and summarized above. Reports included below if pertinent.  Cath 09/20/21 1.  Patent left main, LAD, and left circumflex, with mild nonobstructive plaquing 2.  Severe distal RCA stenosis with positive RFR of 0.78, treated successfully with PCI using a 3.5 x 24 mm Synergy DES deployed over the distal RCA and  extending into the posterolateral branch   Recommendations: Continued risk reduction measures and GDMT for heart failure, aspirin and clopidogrel x 6 months without interruption, okay for discharge this evening as long as criteria met.   Echo 02/2021  1. No obstructive gradient. Left ventricular ejection fraction, by  estimation, is 60 to 65%. The left ventricle has normal function. The left  ventricle has no regional wall motion abnormalities. There is severe  concentric left ventricular hypertrophy.  Left ventricular diastolic parameters are consistent with Grade II  diastolic dysfunction (pseudonormalization).   2. Right ventricular systolic function is normal. The right ventricular  size is normal.   3. Left atrial size was moderately dilated.   4. The mitral valve is normal in structure. Mild mitral valve  regurgitation. No evidence of mitral stenosis.   5. The aortic valve is normal in structure. Aortic valve regurgitation is  mild. No aortic stenosis is present.   6. Aortic dilatation noted. There is moderate dilatation of the aortic  root, measuring 48 mm. There is moderate dilatation of the ascending  aorta, measuring 45 mm.   7. The inferior vena cava is normal in size with greater than 50%  respiratory variability, suggesting right atrial pressure of 3 mmHg.   Comparison(s): The left ventricular function has improved. EF 25%, severe  LVH, AOV 32m, small pericardial effusion.   CTAs - see EMR     Past Medical History:  Diagnosis Date   Aortic regurgitation    Ascending aorta dilation (HCC)    CAD (coronary artery disease), native coronary artery 09/20/2021   Chronic combined systolic and diastolic CHF (congestive heart failure) [place]    Community acquired pneumonia    HLD (hyperlipidemia)    Hypertension    Hypertensive crisis 05/22/2020   [place]rophic [place]    Left ventricular hypertrophy    Mitral regurgitation    Mobitz type 1 second degree AV  block    NSVT (nonsustained ventricular tachycardia) (HCC)    Premature atrial contractions    PVC's (premature ventricular contractions)    Tension headache 06/21/2020   Tobacco use    Tobacco use 09/20/2021    Past Surgical History:  Procedure Laterality Date   CAROTID STENT INSERTION     CORONARY STENT INTERVENTION N/A 09/20/2021   Procedure: CORONARY STENT INTERVENTION;  Surgeon: CSherren Mocha MD;  [facility];  Service: Cardiovascular;  Laterality: N/A;   KNEE SURGERY Left    LEFT HEART CATH AND CORONARY ANGIOGRAPHY N/A 08/31/2020   Procedure: LEFT HEART CATH AND CORONARY ANGIOGRAPHY;  Surgeon: HLeonie Man MD;  [facility];  Service: Cardiovascular;  Laterality: N/A;   LEFT HEART CATH AND CORONARY ANGIOGRAPHY N/A 09/20/2021   Procedure: LEFT HEART CATH AND CORONARY ANGIOGRAPHY;  Surgeon: CSherren Mocha MD;  [facility];  Service: Cardiovascular;  Laterality: N/A;   TONSILLECTOMY      Current Medications: Current Meds  Medication Sig   acetaminophen (TYLENOL) 500 MG tablet Take 2 tablets (1,000 mg total) by mouth every 6 (six) hours as needed (pain).   aspirin EC 81 MG tablet Take 1 tablet (81 mg total) by mouth every evening. Swallow whole.   atorvastatin (LIPITOR) 40  MG tablet Take 1 tablet (40 mg total) by mouth every evening.   benzonatate (TESSALON) 100 MG capsule Take 1 capsule (100 mg total) by mouth every 8 (eight) hours.   carvedilol (COREG) 25 MG tablet Take 1.5 tablets (37.5 mg total) by mouth 2 (two) times daily.   clopidogrel (PLAVIX) 75 MG tablet Take 1 tablet (75 mg total) by mouth daily.   dapagliflozin propanediol (FARXIGA) 10 MG TABS tablet Take 1 tablet (10 mg total) by mouth daily.   isosorbide mononitrate (IMDUR) 30 MG 24 hr tablet Take 0.5 tablets (15 mg total) by mouth daily.   sacubitril-valsartan (ENTRESTO) 97-103 MG Take 1 tablet by mouth 2 (two) times daily.   spironolactone (ALDACTONE) 25 MG  tablet Take 1 tablet (25 mg total) by mouth daily.   [DISCONTINUED] hydrALAZINE (APRESOLINE) 25 MG tablet Take 1 tablet (25 mg total) by mouth 3 (three) times daily.      Allergies:   Mushroom extract complex   Social History   Socioeconomic History   Marital status: Single    Spouse name: Not on file   Number of children: Not on file   Years of education: Not on file   Highest education level: Not on file  Occupational History   Not on file  Tobacco Use   Smoking status: Light Smoker   Smokeless tobacco: Never  Substance and Sexual Activity   Alcohol use: Not Currently   Drug use: Not on file   Sexual activity: Yes  Other Topics Concern   Not on file  Social History Narrative   Not on file   Social Determinants of Health   Financial Resource Strain: Not on file  Food Insecurity: Not on file  Transportation Needs: Not on file  Physical Activity: Not on file  Stress: Not on file  Social Connections: Not on file     Family History:  The patient's family history is positive for CAD in father.  ROS:   Please see the history of present illness. All other systems are reviewed and otherwise negative.    EKG(s)/Additional Labs   EKG:  EKG is ordered today, personally reviewed, demonstrating NSR 69bpm, LAE, LVH, QTc 490m  Recent Labs: 09/06/2021: ALT 30 09/18/2021: BUN 19; Creatinine, Ser 1.36; Hemoglobin 14.9; Platelets 260; Potassium 4.9; Sodium 137  Recent Lipid Panel    Component Value Date/Time   CHOL 119 12/16/2020 0846   TRIG 154 (H) 12/16/2020 0846   HDL 28 (L) 12/16/2020 0846   CHOLHDL 4.3 12/16/2020 0846   CHOLHDL 5.5 05/23/2020 0239   VLDL 16 05/23/2020 0239   LDLCALC 64 12/16/2020 0846    PHYSICAL EXAM:    VS:  BP 104/80   Pulse 69   Ht 5' 9" (1.753 m)   Wt 223 lb 3.2 oz (101.2 kg)   SpO2 95%   BMI 32.96 kg/m   BMI: Body mass index is 32.96 kg/m.  GEN: Well nourished, well developed male in no acute distress HEENT: normocephalic,  atraumatic Neck: no JVD, carotid bruits, or masses Cardiac: RRR; no murmurs, rubs, or gallops, no edema  Respiratory:  clear to auscultation bilaterally, normal work of breathing GI: soft, nontender, nondistended, + BS MS: no deformity or atrophy Skin: warm and dry, no rash, right radial cath site without hematoma or ecchymosis; good pulse. Neuro:  Alert and Oriented x 3, Strength and sensation are intact, follows commands Psych: euthymic mood, full affect  Wt Readings from Last 3 Encounters:  09/29/21 223 lb 3.2 oz (  101.2 kg)  09/20/21 225 lb (102.1 kg)  09/18/21 225 lb 9.6 oz (102.3 kg)     ASSESSMENT & PLAN:   1. CAD - doing well post PCI. Continue ASA + Plavix for at least 6 months; ultimate duration at discretion of Dr. Johney Frame. Continue atorvastatin, last lipids in 12/2020 reviewed. Told patient to come fasting to his 11/2021 appt with Dr. Johney Frame at which time his yearly lipids can be obtained. Tobacco cessation had also been previously suggested as well. Discussed warning sx of bleeding.  2. Lightheadedness - sounds orthostatic in nature given the hot temps and standing up quickly. Only one prior episode. BP by me was 104/80. Will stop hydralazine first and have him follow BP log at home. If he's having any recurrent issues or rise in Cr, would consider decreasing spironolactone or Entresto next. Will also check CBC, BMET. Encouraged to stay cool and reach out for any recurrent symptoms. Unrelated to this event he also already has EP f/u with Dr. Caryl Comes to discuss potential ICD. Addendum: Cr up slightly to 1.61 (most recent baseline around 1.4). Will hold spironolactone for 1 day then decrease to 1/2 tablet daily and recheck BMET in several days at which time I'd suggest to check in with him to find out how BP is running.  3. Chronic HFrEF/NICM, HTN - appears euvolemic. BP low/normal today. Stop hydralazine as above. Continue carvedilol, Farxiga, Imdur, Entresto, spironolactone as  tolerated. He does not require a loop diuretic at this time.  4. Hypertrophic cardiomyopathy - stopping hydralazine as above, patient will continue to follow BP at home. Also has multiple follow-ups over the next several weeks at which time his BP will be reassessed by a clinician. Has f/u with CVTS and EP later this month, Dr. Johney Frame in October, and genetics evaluation in November which we will keep.  5. Mild aortic regurgitation, mild mitral regurgitation - consider repeat echo in 3-5 years, sooner if indicated for other reasons above.  6. Ascending thoracic aortic aneurysm - this is now followed by CVTS - has f/u with Dr. Prescott Gum already previously scheduled later this month, recommended to keep the follow-up visit to discuss more in detail.  7. NSVT, PACs, PVCs, Mobitz 1 AVB - episode of lightheadedness sounded more orthostatic in nature, but already has EP f/u later this month. Aside from discontinuation of hydralazine, continue present regimen.    Cardiac Rehabilitation Eligibility Assessment  The patient is ready to start cardiac rehabilitation from a cardiac standpoint.    Disposition: F/u as scheduled. Sees EP/CVTS later this month, Dr. Johney Frame in October, and genetics in November.    Medication Adjustments/Labs and Tests Ordered: Current medicines are reviewed at length with the patient today.  Concerns regarding medicines are outlined above. Medication changes, Labs and Tests ordered today are summarized above and listed in the Patient Instructions accessible in Encounters.   Signed, Charlie Pitter, PA-C  09/29/2021 9:02 AM    [place] Phone: [phone]; Fax: [phone]

## 2021-09-29 ENCOUNTER — Ambulatory Visit: Payer: 59 | Admitting: Physician Assistant

## 2021-09-29 ENCOUNTER — Encounter: Payer: Self-pay | Admitting: Physician Assistant

## 2021-09-29 VITALS — BP 104/80 | HR 69 | Ht 69.0 in | Wt 223.2 lb

## 2021-09-29 DIAGNOSIS — I428 Other cardiomyopathies: Secondary | ICD-10-CM

## 2021-09-29 DIAGNOSIS — I10 Essential (primary) hypertension: Secondary | ICD-10-CM

## 2021-09-29 DIAGNOSIS — I5022 Chronic systolic (congestive) heart failure: Secondary | ICD-10-CM

## 2021-09-29 DIAGNOSIS — I340 Nonrheumatic mitral (valve) insufficiency: Secondary | ICD-10-CM

## 2021-09-29 DIAGNOSIS — I422 Other hypertrophic cardiomyopathy: Secondary | ICD-10-CM

## 2021-09-29 DIAGNOSIS — I2510 Atherosclerotic heart disease of native coronary artery without angina pectoris: Secondary | ICD-10-CM | POA: Diagnosis not present

## 2021-09-29 DIAGNOSIS — R42 Dizziness and giddiness: Secondary | ICD-10-CM

## 2021-09-29 DIAGNOSIS — I351 Nonrheumatic aortic (valve) insufficiency: Secondary | ICD-10-CM

## 2021-09-29 DIAGNOSIS — I7121 Aneurysm of the ascending aorta, without rupture: Secondary | ICD-10-CM

## 2021-09-29 NOTE — Patient Instructions (Addendum)
Medication Instructions:  STOP Hydralazine  *If you need a refill on your cardiac medications before your next appointment, please call your pharmacy*   Lab Work: TODAY-BMET, CBC If you have labs (blood work) drawn today and your tests are completely normal, you will receive your results only by: [place] (if you have MyChart) OR A paper copy in the mail If you have any lab test that is abnormal or we need to change your treatment, we will call you to review the results.   Testing/Procedures: NONE ORDERED   Follow-Up: At [facility], you and your health needs are our priority.  As part of our continuing mission to provide you with exceptional heart care, we have created designated Provider Care Teams.  These Care Teams include your primary Cardiologist (physician) and Advanced Practice Providers (APPs -  Physician Assistants and Nurse Practitioners) who all work together to provide you with the care you need, when you need it.  We recommend signing up for the patient portal called "MyChart".  Sign up information is provided on this After Visit Summary.  MyChart is used to connect with patients for Virtual Visits (Telemedicine).  Patients are able to view lab/test results, encounter notes, upcoming appointments, etc.  Non-urgent messages can be sent to your provider as well.   To learn more about what you can do with MyChart, go to [email or web address].    Your next appointment:   FOLLOW UP AS SCHEDULED    The format for your next appointment:   In Person  Provider:   Freada Bergeron, MD    Other Instructions RECORD BLOOD PRESSURES AND BRING THEM WITH YOU TO YOUR NEXT APPOINTMENT   Important Information About Sugar

## 2021-09-29 NOTE — Addendum Note (Signed)
Addended by: Ulice Brilliant T on: 09/29/2021 09:10 AM   Modules accepted: Orders

## 2021-09-30 LAB — BASIC METABOLIC PANEL
BUN/Creatinine Ratio: 20 (ref 9–20)
BUN: 33 mg/dL — ABNORMAL HIGH (ref 6–24)
CO2: 22 mmol/L (ref 20–29)
Calcium: 9.6 mg/dL (ref 8.7–10.2)
Chloride: 102 mmol/L (ref 96–106)
Creatinine, Ser: 1.61 mg/dL — ABNORMAL HIGH (ref 0.76–1.27)
Glucose: 92 mg/dL (ref 70–99)
Potassium: 4.2 mmol/L (ref 3.5–5.2)
Sodium: 137 mmol/L (ref 134–144)
eGFR: 53 mL/min/{1.73_m2} — ABNORMAL LOW (ref >59)

## 2021-09-30 LAB — CBC
Hematocrit: 41.4 % (ref 37.5–51.0)
Hemoglobin: 14.3 g/dL (ref 13.0–17.7)
MCH: 29.7 pg (ref 26.6–33.0)
MCHC: 34.5 g/dL (ref 31.5–35.7)
MCV: 86 fL (ref 79–97)
Platelets: 254 10*3/uL (ref 150–450)
RBC: 4.82 x10E6/uL (ref 4.14–5.80)
RDW: 13.1 % (ref 11.6–15.4)
WBC: 10.3 10*3/uL (ref 3.4–10.8)

## 2021-10-05 ENCOUNTER — Other Ambulatory Visit: Payer: Self-pay

## 2021-10-05 DIAGNOSIS — I5022 Chronic systolic (congestive) heart failure: Secondary | ICD-10-CM

## 2021-10-05 DIAGNOSIS — I2510 Atherosclerotic heart disease of native coronary artery without angina pectoris: Secondary | ICD-10-CM

## 2021-10-05 DIAGNOSIS — R42 Dizziness and giddiness: Secondary | ICD-10-CM

## 2021-10-05 DIAGNOSIS — I428 Other cardiomyopathies: Secondary | ICD-10-CM

## 2021-10-05 MED ORDER — SPIRONOLACTONE 25 MG PO TABS: 12.5000 mg | ORAL_TABLET | Freq: Every day | ORAL | 1 refills | Status: DC

## 2021-10-09 ENCOUNTER — Other Ambulatory Visit: Payer: 59

## 2021-10-09 ENCOUNTER — Encounter: Payer: Self-pay | Admitting: Cardiothoracic Surgery

## 2021-10-09 ENCOUNTER — Ambulatory Visit (INDEPENDENT_AMBULATORY_CARE_PROVIDER_SITE_OTHER): Payer: 59 | Admitting: Cardiothoracic Surgery

## 2021-10-09 VITALS — BP 129/81 | HR 61 | Resp 20 | Ht 69.0 in | Wt 222.3 lb

## 2021-10-09 DIAGNOSIS — I712 Thoracic aortic aneurysm, without rupture, unspecified: Secondary | ICD-10-CM | POA: Diagnosis not present

## 2021-10-09 DIAGNOSIS — R42 Dizziness and giddiness: Secondary | ICD-10-CM

## 2021-10-09 DIAGNOSIS — I428 Other cardiomyopathies: Secondary | ICD-10-CM

## 2021-10-09 DIAGNOSIS — M314 Aortic arch syndrome [Takayasu]: Secondary | ICD-10-CM

## 2021-10-09 DIAGNOSIS — I2510 Atherosclerotic heart disease of native coronary artery without angina pectoris: Secondary | ICD-10-CM

## 2021-10-09 DIAGNOSIS — I5022 Chronic systolic (congestive) heart failure: Secondary | ICD-10-CM

## 2021-10-09 NOTE — Progress Notes (Signed)
HPI: Patient returns for scheduled follow-up visit after hospitalization for chest pain found to be due to 90% stenosis of the distal RCA at the bifurcation which was treated successfully with PCI by Dr. Burt Knack.  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  While the patient was hospitalized for the angina a repeat CT scan was performed which showed no change in the aortic root 4.8 cm in the ascending aorta at 4.5 cm. Patient has had no chest pain following the procedure and has been compliant with Plavix and aspirin.  Plan is for the patient to have uninterrupted Plavix and aspirin for at least 6 months.  Patient's recent echocardiogram showed normal LV function, no AI with a normal trileaflet aortic valve. Patient currently does not meet criteria for surgery for the aortic root.  Current Outpatient Medications  Medication Sig Dispense Refill   acetaminophen (TYLENOL) 500 MG tablet Take 2 tablets (1,000 mg total) by mouth every 6 (six) hours as needed (pain). 30 tablet 0   aspirin EC 81 MG tablet Take 1 tablet (81 mg total) by mouth every evening. Swallow whole. 30 tablet 3   atorvastatin (LIPITOR) 40 MG tablet Take 1 tablet (40 mg total) by mouth every evening. 90 tablet 3   benzonatate (TESSALON) 100 MG capsule Take 1 capsule (100 mg total) by mouth every 8 (eight) hours. 21 capsule 0   carvedilol (COREG) 25 MG tablet Take 1.5 tablets (37.5 mg total) by mouth 2 (two) times daily. 270 tablet 3   clopidogrel (PLAVIX) 75 MG tablet Take 1 tablet (75 mg total) by mouth daily. 30 tablet 0   dapagliflozin propanediol (FARXIGA) 10 MG TABS tablet Take 1 tablet (10 mg total) by mouth daily. 30 tablet 5   isosorbide mononitrate (IMDUR) 30 MG 24 hr tablet Take 0.5 tablets (15 mg total) by mouth daily. 45 tablet 3   sacubitril-valsartan (ENTRESTO) 97-103 MG Take 1 tablet by mouth 2 (two) times daily. 60 tablet 4   spironolactone (ALDACTONE) 25 MG tablet Take 0.5  tablets (12.5 mg total) by mouth daily. 30 tablet 1   No current facility-administered medications for this visit.     Physical Exam:  Blood pressure 129/81, pulse 61, resp. rate 20, height '5\' 9"'$  (1.753 m), weight 222 lb 4.8 oz (100.8 kg), SpO2 98 %.         Exam    General- alert and comfortable    Neck- no JVD, no cervical adenopathy palpable, no carotid bruit   Lungs- clear without rales, wheezes   Cor- regular rate and rhythm, no murmur , gallop   Abdomen- soft, non-tender   Extremities - warm, non-tender, minimal edema   Neuro- oriented, appropriate, no focal weakness  Diagnostic Tests: CTA performed recently shows no change in the aortic root or ascending aortic moderate fusiform aneurysm.  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  Recently placed on Plavix/aspirin for RCA PCI  Plan: Report any change in chest pain but plan on repeat surveillance scan of the thoracic aorta in 1 year.   Dahlia Byes, MD [facility] [phone]

## 2021-10-10 ENCOUNTER — Other Ambulatory Visit: Payer: Self-pay

## 2021-10-10 LAB — BASIC METABOLIC PANEL
BUN/Creatinine Ratio: 16 (ref 9–20)
BUN: 21 mg/dL (ref 6–24)
CO2: 19 mmol/L — ABNORMAL LOW (ref 20–29)
Calcium: 9.5 mg/dL (ref 8.7–10.2)
Chloride: 104 mmol/L (ref 96–106)
Creatinine, Ser: 1.32 mg/dL — ABNORMAL HIGH (ref 0.76–1.27)
Glucose: 81 mg/dL (ref 70–99)
Potassium: 4.4 mmol/L (ref 3.5–5.2)
Sodium: 138 mmol/L (ref 134–144)
eGFR: 68 mL/min/{1.73_m2} (ref >59)

## 2021-10-10 MED ORDER — ENTRESTO 97-103 MG PO TABS: 1.0000 | ORAL_TABLET | Freq: Two times a day (BID) | ORAL | 3 refills | Status: DC

## 2021-10-16 DIAGNOSIS — I428 Other cardiomyopathies: Secondary | ICD-10-CM | POA: Insufficient documentation

## 2021-10-16 DIAGNOSIS — I422 Other hypertrophic cardiomyopathy: Secondary | ICD-10-CM | POA: Insufficient documentation

## 2021-10-21 ENCOUNTER — Other Ambulatory Visit: Payer: Self-pay | Admitting: Cardiovascular Disease

## 2021-10-23 ENCOUNTER — Encounter: Payer: Self-pay | Admitting: Internal Medicine

## 2021-10-23 ENCOUNTER — Ambulatory Visit: Payer: 59 | Attending: Internal Medicine | Admitting: Internal Medicine

## 2021-10-23 VITALS — BP 124/82 | HR 69 | Ht 69.0 in | Wt 220.0 lb

## 2021-10-23 DIAGNOSIS — I5042 Chronic combined systolic (congestive) and diastolic (congestive) heart failure: Secondary | ICD-10-CM | POA: Diagnosis not present

## 2021-10-23 DIAGNOSIS — I5020 Unspecified systolic (congestive) heart failure: Secondary | ICD-10-CM | POA: Diagnosis not present

## 2021-10-23 DIAGNOSIS — I422 Other hypertrophic cardiomyopathy: Secondary | ICD-10-CM | POA: Diagnosis not present

## 2021-10-23 LAB — CBC WITH DIFFERENTIAL/PLATELET
Basophils Absolute: 0.1 10*3/uL (ref 0.0–0.2)
Basos: 0 %
EOS (ABSOLUTE): 0.3 10*3/uL (ref 0.0–0.4)
Eos: 2 %
Hematocrit: 42 % (ref 37.5–51.0)
Hemoglobin: 14.2 g/dL (ref 13.0–17.7)
Lymphocytes Absolute: 1.7 10*3/uL (ref 0.7–3.1)
Lymphs: 15 %
MCH: 29.8 pg (ref 26.6–33.0)
MCHC: 33.8 g/dL (ref 31.5–35.7)
MCV: 88 fL (ref 79–97)
Monocytes Absolute: 0.9 10*3/uL (ref 0.1–0.9)
Monocytes: 8 %
Neutrophils Absolute: 8.5 10*3/uL — ABNORMAL HIGH (ref 1.4–7.0)
Neutrophils: 75 %
Platelets: 252 10*3/uL (ref 150–450)
RBC: 4.76 x10E6/uL (ref 4.14–5.80)
RDW: 14.7 % (ref 11.6–15.4)
WBC: 11.4 10*3/uL — ABNORMAL HIGH (ref 3.4–10.8)

## 2021-10-23 LAB — BASIC METABOLIC PANEL
BUN/Creatinine Ratio: 18 (ref 9–20)
BUN: 24 mg/dL (ref 6–24)
CO2: 28 mmol/L (ref 20–29)
Calcium: 10 mg/dL (ref 8.7–10.2)
Chloride: 101 mmol/L (ref 96–106)
Creatinine, Ser: 1.35 mg/dL — ABNORMAL HIGH (ref 0.76–1.27)
Glucose: 94 mg/dL (ref 70–99)
Potassium: 4.6 mmol/L (ref 3.5–5.2)
Sodium: 137 mmol/L (ref 134–144)
eGFR: 66 mL/min/{1.73_m2} (ref >59)

## 2021-10-23 NOTE — Progress Notes (Signed)
ELECTROPHYSIOLOGY CONSULT NOTE  Patient ID: Thomas Spears, MRN: [id number], DOB/AGE: 1975/04/01 46 y.o. Admit date: (Not on file) Date of Consult: 10/23/2021  Primary Physician: Ezequiel Essex, MD Primary Cardiologist: HP     Thomas Spears is a 46 y.o. male who is being seen today for the evaluation of ICD in the context of HCM at the request of HP.    HPI Thomas Spears is a 46 y.o. male referred for consideration of an ICD in the context of hypertrophic cardiomyopathy.  Initially presented May 23, 2022 with a hypertensive crisis LVEF was difficult and depressed and was started on guideline directed therapy. 7/22 event recorder demonstrated nonsustained VT up to 8 beats and 169 bpm  Left ventricular has recovered; evaluation as outlined below demonstrated HCM with positive LGE.  History of recurrent syncope, and ambient heat and with changes in position.  Also has a lightheadedness associated with lying down, this is different.  No chest pain, mild dyspnea on exertion.  No orthopnea or nocturnal dyspnea.  No peripheral edema.  Smoking, he and his fiance both, both are working on quitting.  No alcohol.  Family history notable for hypertension coronary artery disease; care was in [place].  No known diagnosis of HCM, no premature death DATE TEST EF   05/23/2022 TEE 20-25% LAE severe  6/22 cMRI  34 % Septal hypertrophy  109m LGE 12%   1/23 Echo   60-65 % AoRoot 48 LAE 4.9 cm / 45 mL/m  7/23 LHC  RCAd >>stent LM/LAD/LCX non obstructive   Date Cr K Hgb  8/23 1.32 4.4 14.3          Aortic root dilatation followed by Dr. PRon Agee Past Medical History:  Diagnosis Date   Aortic regurgitation    Ascending aorta dilation (HCC)    CAD (coronary artery disease), native coronary artery 09/20/2021   Chronic combined systolic and diastolic CHF (congestive heart failure) [place]    Community acquired pneumonia    HLD (hyperlipidemia)    Hypertension    Hypertensive crisis  05/22/2020   [facility]    Left ventricular hypertrophy    Mitral regurgitation    Mobitz type 1 second degree AV block    NSVT (nonsustained ventricular tachycardia) (HCC)    Premature atrial contractions    PVC's (premature ventricular contractions)    Tension headache 06/21/2020   Tobacco use    Tobacco use 09/20/2021      Surgical History:  Past Surgical History:  Procedure Laterality Date   CAROTID STENT INSERTION     CORONARY STENT INTERVENTION N/A 09/20/2021   Procedure: CORONARY STENT INTERVENTION;  Surgeon: CSherren Mocha MD;  [facility];  Service: Cardiovascular;  Laterality: N/A;   KNEE SURGERY Left    LEFT HEART CATH AND CORONARY ANGIOGRAPHY N/A 08/31/2020   Procedure: LEFT HEART CATH AND CORONARY ANGIOGRAPHY;  Surgeon: HLeonie Man MD;  [facility];  Service: Cardiovascular;  Laterality: N/A;   LEFT HEART CATH AND CORONARY ANGIOGRAPHY N/A 09/20/2021   Procedure: LEFT HEART CATH AND CORONARY ANGIOGRAPHY;  Surgeon: CSherren Mocha MD;  [facility];  Service: Cardiovascular;  Laterality: N/A;   TONSILLECTOMY       Home Meds: Current Meds  Medication Sig   acetaminophen (TYLENOL) 500 MG tablet Take 2 tablets (1,000 mg total) by mouth every 6 (six) hours as needed (pain).   aspirin EC 81 MG tablet Take 1 tablet (81 mg total) by  mouth every evening. Swallow whole.   atorvastatin (LIPITOR) 40 MG tablet Take 1 tablet (40 mg total) by mouth every evening.   carvedilol (COREG) 25 MG tablet Take 1.5 tablets (37.5 mg total) by mouth 2 (two) times daily.   clopidogrel (PLAVIX) 75 MG tablet Take 1 tablet (75 mg total) by mouth daily.   dapagliflozin propanediol (FARXIGA) 10 MG TABS tablet Take 1 tablet (10 mg total) by mouth daily.   hydrALAZINE (APRESOLINE) 25 MG tablet Take 25 mg by mouth 3 (three) times daily.   isosorbide mononitrate (IMDUR) 30 MG 24 hr tablet Take 0.5 tablets (15 mg total) by  mouth daily.   sacubitril-valsartan (ENTRESTO) 97-103 MG Take 1 tablet by mouth 2 (two) times daily.   spironolactone (ALDACTONE) 25 MG tablet Take 0.5 tablets (12.5 mg total) by mouth daily.    Allergies:  Allergies  Allergen Reactions   Mushroom Extract Complex Anaphylaxis    Social History   Socioeconomic History   Marital status: Single    Spouse name: Not on file   Number of children: Not on file   Years of education: Not on file   Highest education level: Not on file  Occupational History   Not on file  Tobacco Use   Smoking status: Light Smoker   Smokeless tobacco: Never  Substance and Sexual Activity   Alcohol use: Not Currently   Drug use: Not on file   Sexual activity: Yes  Other Topics Concern   Not on file  Social History Narrative   Not on file   Social Determinants of Health   Financial Resource Strain: Not on file  Food Insecurity: Not on file  Transportation Needs: Not on file  Physical Activity: Not on file  Stress: Not on file  Social Connections: Not on file  Intimate Partner Violence: Not on file     Family History  Problem Relation Age of Onset   CAD Father      ROS:  Please see the history of present illness.     All other systems reviewed and negative.    Physical Exam:  Blood pressure 124/82, pulse 69, height '5\' 9"'$  (1.753 m), weight 220 lb (99.8 kg), SpO2 98 %. General: Well developed, well nourished male in no acute distress. Head: Normocephalic, atraumatic, sclera non-icteric, no xanthomas, nares are without discharge. EENT: normal  Lymph Nodes:  none Neck: Negative for carotid bruits. JVD not elevated. Back:without scoliosis kyphosis  Lungs: Clear bilaterally to auscultation without wheezes, rales, or rhonchi. Breathing is unlabored. Heart: RRR with S1 S2. No  murmur . No rubs, or gallops appreciated. Abdomen: Soft, non-tender, non-distended with normoactive bowel sounds. No hepatomegaly. No rebound/guarding. No obvious  abdominal masses. Msk:  Strength and tone appear normal for age. Extremities: No clubbing or cyanosis. No  edema.  Distal pedal pulses are 2+ and equal bilaterally. Skin: Warm and Dry Neuro: Alert and oriented X 3. CN III-XII intact Grossly normal sensory and motor function . Psych:  Responds to questions appropriately with a normal affect.        EKG: Sinus at 64 Intervals 19/11/40 T wave inversions 3, F, V5, V6, 1 Q wave in lead V2   Assessment and Plan:  Hypertrophic  cardiomyopathy  Hypertension  HFrecEF  Tobacco abuse  Coronary artery disease with recent stenting  Syncope/near syncope  Positional lightheadedness    The patient has hypertrophic cardiomyopathy with risk factors including septal hypertrophy greater than 30 mm, LGE greater than 10, nonsustained ventricular tachycardia  and recent syncope although the history does not suggest arrhythmic syncope.  We have discussed the role of ICD for primary prevention and sudden cardiac risk reduction.  Discussed the potential risks of device implantation including not limited to death perforation infection lead dislodgment and inappropriate shock  He is presyncope has been related to changes in position in the context of a hot work environment..  Encouraged him to be aggressive with fluid repletion.  His lightheadedness with changes in position particularly becoming recumbent, associated with nystagmus suggest a noncardiac cause, his heart rate was normal to auscultation.  We will arrange for neurological consultation.  \Encouraged to stop smoking          Virl Axe

## 2021-10-23 NOTE — Patient Instructions (Addendum)
Medication Instructions:  Your physician recommends that you continue on your current medications as directed. Please refer to the Current Medication list given to you today.  *If you need a refill on your cardiac medications before your next appointment, please call your pharmacy*  Lab Work: YOU WILL HAVE LABS DRAWN TODAY: CBC AND BMP.    Follow-Up:  SEE INSTRUCTION LETTER.    Important Information About Sugar

## 2021-10-31 NOTE — Pre-Procedure Instructions (Signed)
Attempted to call patient regarding procedure instruction.  No answer

## 2021-11-01 ENCOUNTER — Ambulatory Visit (HOSPITAL_COMMUNITY)
Admission: RE | Admit: 2021-11-01 | Discharge: 2021-11-01 | Disposition: A | Discharge status: Home / Self Care | Payer: Commercial Managed Care - HMO | Attending: Internal Medicine | Admitting: Internal Medicine

## 2021-11-01 ENCOUNTER — Ambulatory Visit (HOSPITAL_COMMUNITY): Payer: Commercial Managed Care - HMO

## 2021-11-01 ENCOUNTER — Encounter (HOSPITAL_COMMUNITY)
Admission: RE | Disposition: A | Discharge status: Home / Self Care | Payer: Commercial Managed Care - HMO | Source: Home / Self Care | Attending: Internal Medicine

## 2021-11-01 ENCOUNTER — Other Ambulatory Visit: Payer: Self-pay

## 2021-11-01 DIAGNOSIS — Z955 Presence of coronary angioplasty implant and graft: Secondary | ICD-10-CM | POA: Insufficient documentation

## 2021-11-01 DIAGNOSIS — F17200 Nicotine dependence, unspecified, uncomplicated: Secondary | ICD-10-CM | POA: Diagnosis not present

## 2021-11-01 DIAGNOSIS — I5042 Chronic combined systolic (congestive) and diastolic (congestive) heart failure: Secondary | ICD-10-CM | POA: Insufficient documentation

## 2021-11-01 DIAGNOSIS — I110 Hypertensive heart disease with heart failure: Secondary | ICD-10-CM | POA: Diagnosis not present

## 2021-11-01 DIAGNOSIS — I421 Obstructive hypertrophic cardiomyopathy: Secondary | ICD-10-CM | POA: Diagnosis not present

## 2021-11-01 DIAGNOSIS — R42 Dizziness and giddiness: Secondary | ICD-10-CM | POA: Diagnosis not present

## 2021-11-01 DIAGNOSIS — I2510 Atherosclerotic heart disease of native coronary artery without angina pectoris: Secondary | ICD-10-CM | POA: Diagnosis not present

## 2021-11-01 DIAGNOSIS — I422 Other hypertrophic cardiomyopathy: Secondary | ICD-10-CM | POA: Diagnosis present

## 2021-11-01 DIAGNOSIS — R55 Syncope and collapse: Secondary | ICD-10-CM | POA: Diagnosis not present

## 2021-11-01 HISTORY — PX: ICD IMPLANT: EP1208

## 2021-11-01 SURGERY — ICD IMPLANT

## 2021-11-01 MED ORDER — SODIUM CHLORIDE 0.9 % IV SOLN
80.0000 mg | INTRAVENOUS | Status: AC
  Administered 2021-11-01: 80 mg

## 2021-11-01 MED ORDER — SODIUM CHLORIDE 0.9 % IV SOLN: INTRAVENOUS | Status: DC

## 2021-11-01 MED ORDER — HEPARIN (PORCINE) IN NACL 1000-0.9 UT/500ML-% IV SOLN
INTRAVENOUS | Status: DC | PRN
  Administered 2021-11-01: 500 mL

## 2021-11-01 MED ORDER — HEPARIN (PORCINE) IN NACL 1000-0.9 UT/500ML-% IV SOLN
INTRAVENOUS | Status: AC
  Filled 2021-11-01: qty 500

## 2021-11-01 MED ORDER — ONDANSETRON HCL 4 MG/2ML IJ SOLN: 4.0000 mg | Freq: Four times a day (QID) | INTRAMUSCULAR | Status: DC | PRN

## 2021-11-01 MED ORDER — CLOPIDOGREL BISULFATE 75 MG PO TABS: ORAL_TABLET | ORAL | 0 refills | Status: DC

## 2021-11-01 MED ORDER — SODIUM CHLORIDE 0.9 % IV SOLN
INTRAVENOUS | Status: AC
  Filled 2021-11-01: qty 2

## 2021-11-01 MED ORDER — LIDOCAINE HCL (PF) 1 % IJ SOLN
INTRAMUSCULAR | Status: AC
  Filled 2021-11-01: qty 30

## 2021-11-01 MED ORDER — FENTANYL CITRATE (PF) 100 MCG/2ML IJ SOLN
INTRAMUSCULAR | Status: AC
  Filled 2021-11-01: qty 2

## 2021-11-01 MED ORDER — LIDOCAINE HCL (PF) 1 % IJ SOLN
INTRAMUSCULAR | Status: DC | PRN
  Administered 2021-11-01: 60 mL

## 2021-11-01 MED ORDER — MIDAZOLAM HCL 5 MG/5ML IJ SOLN
INTRAMUSCULAR | Status: AC
  Filled 2021-11-01: qty 5

## 2021-11-01 MED ORDER — CHLORHEXIDINE GLUCONATE 4 % EX LIQD
4.0000 | Freq: Once | CUTANEOUS | Status: DC
  Filled 2021-11-01: qty 60

## 2021-11-01 MED ORDER — CEFAZOLIN SODIUM-DEXTROSE 2-4 GM/100ML-% IV SOLN
2.0000 g | INTRAVENOUS | Status: AC
  Administered 2021-11-01: 2 g via INTRAVENOUS

## 2021-11-01 MED ORDER — CEFAZOLIN SODIUM-DEXTROSE 2-4 GM/100ML-% IV SOLN
INTRAVENOUS | Status: AC
  Filled 2021-11-01: qty 100

## 2021-11-01 MED ORDER — MIDAZOLAM HCL 5 MG/5ML IJ SOLN
INTRAMUSCULAR | Status: DC | PRN
  Administered 2021-11-01: 2 mg via INTRAVENOUS
  Administered 2021-11-01: 1 mg via INTRAVENOUS

## 2021-11-01 MED ORDER — ACETAMINOPHEN 325 MG PO TABS
325.0000 mg | ORAL_TABLET | ORAL | Status: DC | PRN
  Administered 2021-11-01: 650 mg via ORAL
  Filled 2021-11-01: qty 2

## 2021-11-01 SURGICAL SUPPLY — 7 items
CABLE SURGICAL S-101-97-12 (CABLE) ×1 IMPLANT
ICD VIGILANT VR D232 (Pacemaker) IMPLANT
LEAD RELIANCE 0673 IMPLANT
PAD DEFIB RADIO PHYSIO CONN (PAD) ×1 IMPLANT
SHEATH 8FR PRELUDE SNAP 13 (SHEATH) IMPLANT
TRAY PACEMAKER INSERTION (PACKS) ×1 IMPLANT
WIRE MICRO SET SILHO 5FR 7 (SHEATH) IMPLANT

## 2021-11-01 NOTE — Discharge Instructions (Signed)
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 After 7 days, you may progress as below.  You should remove your sling 24 hours after your procedure, unless otherwise instructed by your provider.     Wednesday November 08, 2021  Thursday November 09, 2021 Friday November 10, 2021 Saturday November 11, 2021   Do not lift, push, pull, or carry anything over 10 pounds with the affected arm until 6 weeks (Wednesday December 13, 2021 ) after your procedure.   You may drive AFTER your wound check, unless you have been told otherwise by your provider.   Ask your healthcare provider when you can go back to work   INCISION/Dressing If you are on a blood thinner such as Coumadin, Xarelto, Eliquis, Plavix, or Pradaxa please confirm with your provider when this should be resumed. 9/10  If large square, outer bandage is left in place, this can be removed after 24 hours from your procedure. Do not remove steri-strips or glue as below.   Monitor your defibrillator site for redness, swelling, and drainage. Call the device clinic at [phone] if you experience these symptoms or fever/chills.  If your incision is sealed with Steri-strips or staples, you may shower 7 days after your procedure or when told by your provider. Do not remove the steri-strips or let the shower hit directly on your site. You may wash around your site with soap and water.    If you were discharged in a sling, please do not wear this during the day more than 48 hours after your surgery unless otherwise instructed. This may increase the risk of stiffness and soreness in your shoulder.   Avoid lotions, ointments, or perfumes over your incision until it is well-healed.  You may use a hot tub or a pool AFTER your wound check appointment if the incision is completely closed.  Your ICD is designed to  protect you from life threatening heart rhythms. Because of this, you may receive a shock.   1 shock with no symptoms:  Call the office during business hours. 1 shock with symptoms (chest pain, chest pressure, dizziness, lightheadedness, shortness of breath, overall feeling unwell):  Call 911. If you experience 2 or more shocks in 24 hours:  Call 911. If you receive a shock, you should not drive for 6 months per the [place] DMV IF you receive appropriate therapy from your ICD.   ICD Alerts:  Some alerts are vibratory and others beep. These are NOT emergencies. Please call our office to let us know. If this occurs at night or on weekends, it can wait until the next business day. Send a remote transmission.  If your device is capable of reading fluid status (for heart failure), you will be offered monthly monitoring to review this with you.   DEVICE MANAGEMENT Remote monitoring is used to monitor your ICD from home. This monitoring is scheduled every 91 days by our office. It allows [place] to keep an eye on the functioning of your device to ensure it is working properly. You will routinely see your Electrophysiologist annually (more often if necessary).   You should receive your ID card for your new device in 4-8 weeks. Keep this card with you at all times once received. Consider wearing a medical alert bracelet or necklace.  Your ICD  may be MRI compatible. This will be discussed at your  next office visit/wound check.  You should avoid contact with strong electric or magnetic fields.   Do not use amateur (ham) radio equipment or electric (arc) welding torches. MP3 player headphones with magnets should not be used. Some devices are safe to use if held at least 12 inches (30 cm) from your defibrillator. These include power tools, lawn mowers, and speakers. If you are unsure if something is safe to use, ask your health care provider.  When using your cell phone, hold it to the ear that is on the opposite side  from the defibrillator. Do not leave your cell phone in a pocket over the defibrillator.  You may safely use electric blankets, heating pads, computers, and microwave ovens.  Call the office right away if: You have chest pain. You feel more than one shock. You feel more short of breath than you have felt before. You feel more light-headed than you have felt before. Your incision starts to open up.  This information is not intended to replace advice given to you by your health care provider. Make sure you discuss any questions you have with your health care provider.

## 2021-11-02 ENCOUNTER — Encounter (HOSPITAL_COMMUNITY): Payer: Self-pay | Admitting: Internal Medicine

## 2021-11-03 ENCOUNTER — Encounter: Payer: Self-pay | Admitting: Internal Medicine

## 2021-11-03 ENCOUNTER — Telehealth: Payer: Self-pay

## 2021-11-03 NOTE — Telephone Encounter (Signed)
Follow-up after same day discharge: Implant date: 11/01/2021 MD: Virl Axe, MD Device: [place] VIGILANT EL ICD Location: Left Chest   Wound check visit: 11/15/2021 @ 8:40 AM 90 day MD follow-up: 02/02/2022 @ 2:00 AM  Remote Transmission received:Yes  Dressing/sling removed: TBD  Confirm OAC restart on:  N/A  Attempted to complete same day d/c. No answer, Unable to leave VM d/t mailbox full.

## 2021-11-08 ENCOUNTER — Ambulatory Visit: Payer: Commercial Managed Care - HMO | Attending: Internal Medicine

## 2021-11-08 DIAGNOSIS — I422 Other hypertrophic cardiomyopathy: Secondary | ICD-10-CM | POA: Diagnosis not present

## 2021-11-08 LAB — CUP PACEART INCLINIC DEVICE CHECK
Date Time Interrogation Session: 20230913000000
HighPow Impedance: 68 Ohm
Implantable Lead Implant Date: 20230906
Implantable Lead Location: 753860
Implantable Lead Model: 673
Implantable Lead Serial Number: 201402
Implantable Pulse Generator Implant Date: 20230906
Lead Channel Impedance Value: 369 Ohm
Lead Channel Pacing Threshold Amplitude: 0.6 V
Lead Channel Pacing Threshold Pulse Width: 0.4 ms
Lead Channel Sensing Intrinsic Amplitude: 14.3 mV
Lead Channel Setting Pacing Amplitude: 3.5 V
Lead Channel Setting Pacing Pulse Width: 0.4 ms
Lead Channel Setting Sensing Sensitivity: 0.5 mV
Pulse Gen Serial Number: 316727

## 2021-11-08 NOTE — Patient Instructions (Signed)

## 2021-11-08 NOTE — Progress Notes (Signed)

## 2021-11-15 ENCOUNTER — Ambulatory Visit: Payer: 59

## 2021-11-15 NOTE — H&P (Signed)
No change from Office visit

## 2021-11-16 NOTE — H&P (Signed)
No change since clinic visit

## 2021-11-21 ENCOUNTER — Encounter (HOSPITAL_COMMUNITY)
Admission: RE | Admit: 2021-11-21 | Discharge: 2021-11-21 | Disposition: A | Discharge status: Home / Self Care | Payer: Commercial Managed Care - HMO | Source: Ambulatory Visit | Attending: Cardiology | Admitting: Cardiology

## 2021-11-21 ENCOUNTER — Telehealth (HOSPITAL_COMMUNITY): Payer: Self-pay | Admitting: *Deleted

## 2021-11-21 ENCOUNTER — Encounter (HOSPITAL_COMMUNITY): Payer: Self-pay

## 2021-11-21 VITALS — BP 142/84 | HR 65 | Ht 69.0 in | Wt 225.1 lb

## 2021-11-21 DIAGNOSIS — Z955 Presence of coronary angioplasty implant and graft: Secondary | ICD-10-CM | POA: Insufficient documentation

## 2021-11-21 NOTE — Progress Notes (Signed)
Cardiac Individual Treatment Plan  Patient Details  Name: Thomas Spears MRN: [id number] Date of Birth: [birth year] Referring Provider:   Flowsheet Row CARDIAC REHAB PHASE II ORIENTATION from 11/21/2021 in [place]  Referring Provider Gwyndolyn Kaufman, MD       Initial Encounter Date:  [place] PHASE II ORIENTATION from 11/21/2021 in [place]  Date 11/21/21       Visit Diagnosis: 09/20/21 S/P DES RCA, S/P ICD implant 11/01/21  Patient's Home Medications on Admission:  Current Outpatient Medications:    acetaminophen (TYLENOL) 500 MG tablet, Take 2 tablets (1,000 mg total) by mouth every 6 (six) hours as needed (pain)., Disp: 30 tablet, Rfl: 0   aspirin EC 81 MG tablet, Take 1 tablet (81 mg total) by mouth every evening. Swallow whole., Disp: 30 tablet, Rfl: 3   atorvastatin (LIPITOR) 40 MG tablet, Take 1 tablet (40 mg total) by mouth every evening., Disp: 90 tablet, Rfl: 3   carvedilol (COREG) 25 MG tablet, Take 1.5 tablets (37.5 mg total) by mouth 2 (two) times daily., Disp: 270 tablet, Rfl: 3   clopidogrel (PLAVIX) 75 MG tablet, TAKE 1 TABLET(75 MG) BY MOUTH DAILY, Disp: 30 tablet, Rfl: 0   dapagliflozin propanediol (FARXIGA) 10 MG TABS tablet, Take 1 tablet (10 mg total) by mouth daily., Disp: 30 tablet, Rfl: 5   hydrALAZINE (APRESOLINE) 25 MG tablet, Take 25 mg by mouth 3 (three) times daily., Disp: , Rfl:    isosorbide mononitrate (IMDUR) 30 MG 24 hr tablet, Take 0.5 tablets (15 mg total) by mouth daily., Disp: 45 tablet, Rfl: 3   sacubitril-valsartan (ENTRESTO) 97-103 MG, Take 1 tablet by mouth 2 (two) times daily., Disp: 180 tablet, Rfl: 3   spironolactone (ALDACTONE) 25 MG tablet, Take 0.5 tablets (12.5 mg total) by mouth daily., Disp: 30 tablet, Rfl: 1  Past Medical History: Past Medical History:  Diagnosis Date   Aortic regurgitation    Ascending aorta dilation (HCC)    CAD  (coronary artery disease), native coronary artery 09/20/2021   Chronic combined systolic and diastolic CHF (congestive heart failure) ([place])    Community acquired pneumonia    HLD (hyperlipidemia)    Hypertension    Hypertensive crisis 05/22/2020   Hypertrophic cardiomyopathy ([place])    Left ventricular hypertrophy    Mitral regurgitation    Mobitz type 1 second degree AV block    NSVT (nonsustained ventricular tachycardia) (HCC)    Premature atrial contractions    PVC's (premature ventricular contractions)    Tension headache 06/21/2020   Tobacco use    Tobacco use 09/20/2021    Tobacco Use: Social History   Tobacco Use  Smoking Status Every Day   Packs/day: 0.50   Years: 37.00   Total pack years: 18.50   Types: Cigarettes  Smokeless Tobacco Never  Tobacco Comments   Patient given 1-800-quit-now.  Agreed to having referral faxed for smoking cessation    Labs: Review Flowsheet       Latest Ref Rng & Units 05/23/2020 12/16/2020 09/04/2021  Labs for ITP Cardiac and Pulmonary Rehab  Cholestrol 100 - 199 mg/dL 137  119  -  LDL (calc) 0 - 99 mg/dL 96  64  -  HDL-C >39 mg/dL 25  28  -  Trlycerides 0 - 149 mg/dL 80  154  -  Hemoglobin A1c 4.8 - 5.6 % 5.4  - -  TCO2 22 - 32 mmol/L - - 26  Capillary Blood Glucose: No results found for: "GLUCAP"   Exercise Target Goals: Exercise Program Goal: Individual exercise prescription set using results from initial 6 min walk test and THRR while considering  patient's activity barriers and safety.   Exercise Prescription Goal: Initial exercise prescription builds to 30-45 minutes a day of aerobic activity, 2-3 days per week.  Home exercise guidelines will be given to patient during program as part of exercise prescription that the participant will acknowledge.  Activity Barriers & Risk Stratification:  Activity Barriers & Cardiac Risk Stratification - 11/21/21 1153       Activity Barriers & Cardiac Risk Stratification    Activity Barriers Joint Problems;Deconditioning;Balance Concerns    Cardiac Risk Stratification High             6 Minute Walk:  6 Minute Walk     Row Name 11/21/21 0908         6 Minute Walk   Phase Initial     Distance 1419 feet     Walk Time 6 minutes     # of Rest Breaks 0     MPH 2.7     METS 3.97     RPE 9     Perceived Dyspnea  0     VO2 Peak 13.9     Symptoms Yes (comment)     Comments Feet are tired due to walkin in shoes with steel toes     Resting HR 67 bpm     Resting BP 142/84     Resting Oxygen Saturation  98 %     Exercise Oxygen Saturation  during 6 min walk 97 %     Max Ex. HR 81 bpm     Max Ex. BP 140/90     2 Minute Post BP 128/86              Oxygen Initial Assessment:   Oxygen Re-Evaluation:   Oxygen Discharge (Final Oxygen Re-Evaluation):   Initial Exercise Prescription:  Initial Exercise Prescription - 11/21/21 1200       Date of Initial Exercise RX and Referring Provider   Date 11/21/21    Referring Provider Gwyndolyn Kaufman, MD    Expected Discharge Date 11/27/21      Recumbant Bike   Level 2    RPM 60    Watts 60    Minutes 15    METs 3.9      Track   Laps 14    Minutes 15    METs 2.62             Perform Capillary Blood Glucose checks as needed.  Exercise Prescription Changes:   Exercise Comments:   Exercise Goals and Review:   Exercise Goals     Row Name 11/21/21 1200             Exercise Goals   Increase Physical Activity Yes       Intervention Provide advice, education, support and counseling about physical activity/exercise needs.;Develop an individualized exercise prescription for aerobic and resistive training based on initial evaluation findings, risk stratification, comorbidities and participant's personal goals.       Expected Outcomes Short Term: Attend rehab on a regular basis to increase amount of physical activity.;Long Term: Add in home exercise to make exercise part of routine  and to increase amount of physical activity.;Long Term: Exercising regularly at least 3-5 days a week.       Increase Strength and Stamina Yes  Intervention Provide advice, education, support and counseling about physical activity/exercise needs.;Develop an individualized exercise prescription for aerobic and resistive training based on initial evaluation findings, risk stratification, comorbidities and participant's personal goals.       Expected Outcomes Short Term: Increase workloads from initial exercise prescription for resistance, speed, and METs.;Short Term: Perform resistance training exercises routinely during rehab and add in resistance training at home;Long Term: Improve cardiorespiratory fitness, muscular endurance and strength as measured by increased METs and functional capacity (6MWT)       Able to understand and use rate of perceived exertion (RPE) scale Yes       Intervention Provide education and explanation on how to use RPE scale       Expected Outcomes Short Term: Able to use RPE daily in rehab to express subjective intensity level;Long Term:  Able to use RPE to guide intensity level when exercising independently       Knowledge and understanding of Target Heart Rate Range (THRR) Yes       Intervention Provide education and explanation of THRR including how the numbers were predicted and where they are located for reference       Expected Outcomes Short Term: Able to state/look up THRR;Short Term: Able to use daily as guideline for intensity in rehab;Long Term: Able to use THRR to govern intensity when exercising independently       Understanding of Exercise Prescription Yes       Intervention Provide education, explanation, and written materials on patient's individual exercise prescription       Expected Outcomes Short Term: Able to explain program exercise prescription;Long Term: Able to explain home exercise prescription to exercise independently                 Exercise Goals Re-Evaluation :   Discharge Exercise Prescription (Final Exercise Prescription Changes):   Nutrition:  Target Goals: Understanding of nutrition guidelines, daily intake of sodium '1500mg'$ , cholesterol '200mg'$ , calories 30% from fat and 7% or less from saturated fats, daily to have 5 or more servings of fruits and vegetables.  Biometrics:  Pre Biometrics - 11/21/21 0800       Pre Biometrics   Waist Circumference 45.75 inches    Hip Circumference 44.5 inches    Waist to Hip Ratio 1.03 %    Triceps Skinfold 16 mm    % Body Fat 31.7 %    Grip Strength 38 kg    Flexibility 17 in    Single Leg Stand 3.36 seconds              Nutrition Therapy Plan and Nutrition Goals:   Nutrition Assessments:  MEDIFICTS Score Key: ?70 Need to make dietary changes  40-70 Heart Healthy Diet ? 40 Therapeutic Level Cholesterol Diet    Picture Your Plate Scores: <27 Unhealthy dietary pattern with much room for improvement. 41-50 Dietary pattern unlikely to meet recommendations for good health and room for improvement. 51-60 More healthful dietary pattern, with some room for improvement.  >60 Healthy dietary pattern, although there may be some specific behaviors that could be improved.    Nutrition Goals Re-Evaluation:   Nutrition Goals Re-Evaluation:   Nutrition Goals Discharge (Final Nutrition Goals Re-Evaluation):   Psychosocial: Target Goals: Acknowledge presence or absence of significant depression and/or stress, maximize coping skills, provide positive support system. Participant is able to verbalize types and ability to use techniques and skills needed for reducing stress and depression.  Initial Review & Psychosocial Screening:  Initial Psych  Review & Screening - 11/21/21 1244       Initial Review   Current issues with Current Anxiety/Panic;Current Depression;Current Stress Concerns   Gerald Stabs reports having some depression due to his recent hospitaliztion  and ICD placement.   Source of Stress Concerns Chronic Illness;Financial    Comments Gerald Stabs has some stress concerns due to being without income during his cardiac event. Gerald Stabs was out of work for a week      [place]? Yes   Gerald Stabs has his fiancee and his 70 year old daughter for support     Barriers   Psychosocial barriers to participate in program The patient should benefit from training in stress management and relaxation.      Screening Interventions   Interventions Encouraged to exercise;Provide feedback about the scores to participant    Expected Outcomes Long Term Goal: Stressors or current issues are controlled or eliminated.;Long Term goal: The participant improves quality of Life and PHQ9 Scores as seen by post scores and/or verbalization of changes             Quality of Life Scores:  Quality of Life - 11/21/21 1205       Quality of Life   Select Quality of Life      Quality of Life Scores   Health/Function Pre 27.2 %    Socioeconomic Pre 27.88 %    Psych/Spiritual Pre 30 %    Family Pre 30 %    GLOBAL Pre 28.31 %            Scores of 19 and below usually indicate a poorer quality of life in these areas.  A difference of  2-3 points is a clinically meaningful difference.  A difference of 2-3 points in the total score of the Quality of Life Index has been associated with significant improvement in overall quality of life, self-image, physical symptoms, and general health in studies assessing change in quality of life.  PHQ-9: Review Flowsheet  More data exists      11/21/2021 07/13/2020 06/21/2020 06/07/2020 05/31/2020  Depression screen PHQ 2/9  Decreased Interest 0 0 0 0 0  Down, Depressed, Hopeless 1 0 0 0 0  PHQ - 2 Score 1 0 0 0 0  Altered sleeping - 0 0 0 1  Tired, decreased energy - 0 0 0 0  Change in appetite - 0 0 0 0  Feeling bad or failure about yourself  - 0 0 0 0  Trouble concentrating - 0 0 0 0  Moving slowly or  fidgety/restless - 0 0 0 0  Suicidal thoughts - 0 0 0 0  PHQ-9 Score - 0 0 0 1  Difficult doing work/chores - - Not difficult at all Not difficult at all Somewhat difficult   Interpretation of Total Score  Total Score Depression Severity:  1-4 = Minimal depression, 5-9 = Mild depression, 10-14 = Moderate depression, 15-19 = Moderately severe depression, 20-27 = Severe depression   Psychosocial Evaluation and Intervention:   Psychosocial Re-Evaluation:   Psychosocial Discharge (Final Psychosocial Re-Evaluation):   Vocational Rehabilitation: Provide vocational rehab assistance to qualifying candidates.   Vocational Rehab Evaluation & Intervention:  Vocational Rehab - 11/21/21 1247       Initial Vocational Rehab Evaluation & Intervention   Assessment shows need for Vocational Rehabilitation No   Gerald Stabs works full time and does not need vocational rehab at this time  Education: Education Goals: Education classes will be provided on a weekly basis, covering required topics. Participant will state understanding/return demonstration of topics presented.     Core Videos: Exercise    Move It!  Clinical staff conducted group or individual video education with verbal and written material and guidebook.  Patient learns the recommended Pritikin exercise program. Exercise with the goal of living a long, healthy life. Some of the health benefits of exercise include controlled diabetes, healthier blood pressure levels, improved cholesterol levels, improved heart and lung capacity, improved sleep, and better body composition. Everyone should speak with their doctor before starting or changing an exercise routine.  Biomechanical Limitations Clinical staff conducted group or individual video education with verbal and written material and guidebook.  Patient learns how biomechanical limitations can impact exercise and how we can mitigate and possibly overcome limitations to have  an impactful and balanced exercise routine.  Body Composition Clinical staff conducted group or individual video education with verbal and written material and guidebook.  Patient learns that body composition (ratio of muscle mass to fat mass) is a key component to assessing overall fitness, rather than body weight alone. Increased fat mass, especially visceral belly fat, can put [place] at increased risk for metabolic syndrome, type 2 diabetes, heart disease, and even death. It is recommended to combine diet and exercise (cardiovascular and resistance training) to improve your body composition. Seek guidance from your physician and exercise physiologist before implementing an exercise routine.  Exercise Action Plan Clinical staff conducted group or individual video education with verbal and written material and guidebook.  Patient learns the recommended strategies to achieve and enjoy long-term exercise adherence, including variety, self-motivation, self-efficacy, and positive decision making. Benefits of exercise include fitness, good health, weight management, more energy, better sleep, less stress, and overall well-being.  Medical   Heart Disease Risk Reduction Clinical staff conducted group or individual video education with verbal and written material and guidebook.  Patient learns our heart is our most vital organ as it circulates oxygen, nutrients, white blood cells, and hormones throughout the entire body, and carries waste away. Data supports a plant-based eating plan like the Pritikin Program for its effectiveness in slowing progression of and reversing heart disease. The video provides a number of recommendations to address heart disease.   Metabolic Syndrome and Belly Fat  Clinical staff conducted group or individual video education with verbal and written material and guidebook.  Patient learns what metabolic syndrome is, how it leads to heart disease, and how one can reverse it and keep it  from coming back. You have metabolic syndrome if you have 3 of the following 5 criteria: abdominal obesity, high blood pressure, high triglycerides, low HDL cholesterol, and high blood sugar.  Hypertension and Heart Disease Clinical staff conducted group or individual video education with verbal and written material and guidebook.  Patient learns that high blood pressure, or hypertension, is very common in the [place]. Hypertension is largely due to excessive salt intake, but other important risk factors include being overweight, physical inactivity, drinking too much alcohol, smoking, and not eating enough potassium from fruits and vegetables. High blood pressure is a leading risk factor for heart attack, stroke, congestive heart failure, dementia, kidney failure, and premature death. Long-term effects of excessive salt intake include stiffening of the arteries and thickening of heart muscle and organ damage. Recommendations include ways to reduce hypertension and the risk of heart disease.  Diseases of Our Time - Focusing on Diabetes Clinical  staff conducted group or individual video education with verbal and written material and guidebook.  Patient learns why the best way to stop diseases of our time is prevention, through food and other lifestyle changes. Medicine (such as prescription pills and surgeries) is often only a Band-Aid on the problem, not a long-term solution. Most common diseases of our time include obesity, type 2 diabetes, hypertension, heart disease, and cancer. The Pritikin Program is recommended and has been proven to help reduce, reverse, and/or prevent the damaging effects of metabolic syndrome.  Nutrition   Overview of the Pritikin Eating Plan  Clinical staff conducted group or individual video education with verbal and written material and guidebook.  Patient learns about the [place] for disease risk reduction. The [place] emphasizes a wide  variety of unrefined, minimally-processed carbohydrates, like fruits, vegetables, whole grains, and legumes. Go, Caution, and Stop food choices are explained. Plant-based and lean animal proteins are emphasized. Rationale provided for low sodium intake for blood pressure control, low added sugars for blood sugar stabilization, and low added fats and oils for coronary artery disease risk reduction and weight management.  Calorie Density  Clinical staff conducted group or individual video education with verbal and written material and guidebook.  Patient learns about calorie density and how it impacts the Pritikin Eating Plan. Knowing the characteristics of the food you choose will help you decide whether those foods will lead to weight gain or weight loss, and whether you want to consume more or less of them. Weight loss is usually a side effect of the Pritikin Eating Plan because of its focus on low calorie-dense foods.  Label Reading  Clinical staff conducted group or individual video education with verbal and written material and guidebook.  Patient learns about the Pritikin recommended label reading guidelines and corresponding recommendations regarding calorie density, added sugars, sodium content, and whole grains.  Dining Out - Part 1  Clinical staff conducted group or individual video education with verbal and written material and guidebook.  Patient learns that restaurant meals can be sabotaging because they can be so high in calories, fat, sodium, and/or sugar. Patient learns recommended strategies on how to positively address this and avoid unhealthy pitfalls.  Facts on Fats  Clinical staff conducted group or individual video education with verbal and written material and guidebook.  Patient learns that lifestyle modifications can be just as effective, if not more so, as many medications for lowering your risk of heart disease. A Pritikin lifestyle can help to reduce your risk of  inflammation and atherosclerosis (cholesterol build-up, or plaque, in the artery walls). Lifestyle interventions such as dietary choices and physical activity address the cause of atherosclerosis. A review of the types of fats and their impact on blood cholesterol levels, along with dietary recommendations to reduce fat intake is also included.  Nutrition Action Plan  Clinical staff conducted group or individual video education with verbal and written material and guidebook.  Patient learns how to incorporate Pritikin recommendations into their lifestyle. Recommendations include planning and keeping personal health goals in mind as an important part of their success.  Healthy Mind-Set    Healthy Minds, Bodies, Hearts  Clinical staff conducted group or individual video education with verbal and written material and guidebook.  Patient learns how to identify when they are stressed. Video will discuss the impact of that stress, as well as the many benefits of stress management. Patient will also be introduced to stress management techniques. The way  we think, act, and feel has an impact on our hearts.  How Our Thoughts Can Heal Our Hearts  Clinical staff conducted group or individual video education with verbal and written material and guidebook.  Patient learns that negative thoughts can cause depression and anxiety. This can result in negative lifestyle behavior and serious health problems. Cognitive behavioral therapy is an effective method to help control our thoughts in order to change and improve our emotional outlook.  Additional Videos:  Exercise    Improving Performance  Clinical staff conducted group or individual video education with verbal and written material and guidebook.  Patient learns to use a non-linear approach by alternating intensity levels and lengths of time spent exercising to help burn more calories and lose more body fat. Cardiovascular exercise helps improve heart health,  metabolism, hormonal balance, blood sugar control, and recovery from fatigue. Resistance training improves strength, endurance, balance, coordination, reaction time, metabolism, and muscle mass. Flexibility exercise improves circulation, posture, and balance. Seek guidance from your physician and exercise physiologist before implementing an exercise routine and learn your capabilities and proper form for all exercise.  Introduction to Yoga  Clinical staff conducted group or individual video education with verbal and written material and guidebook.  Patient learns about yoga, a discipline of the coming together of mind, breath, and body. The benefits of yoga include improved flexibility, improved range of motion, better posture and core strength, increased lung function, weight loss, and positive self-image. Yoga's heart health benefits include lowered blood pressure, healthier heart rate, decreased cholesterol and triglyceride levels, improved immune function, and reduced stress. Seek guidance from your physician and exercise physiologist before implementing an exercise routine and learn your capabilities and proper form for all exercise.  Medical   Aging: Enhancing Your Quality of Life  Clinical staff conducted group or individual video education with verbal and written material and guidebook.  Patient learns key strategies and recommendations to stay in good physical health and enhance quality of life, such as prevention strategies, having an advocate, securing a [place], and keeping a list of medications and system for tracking them. It also discusses how to avoid risk for bone loss.  Biology of Weight Control  Clinical staff conducted group or individual video education with verbal and written material and guidebook.  Patient learns that weight gain occurs because we consume more calories than we burn (eating more, moving less). Even if your body weight is normal, you  may have higher ratios of fat compared to muscle mass. Too much body fat puts you at increased risk for cardiovascular disease, heart attack, stroke, type 2 diabetes, and obesity-related cancers. In addition to exercise, following the [place] can help reduce your risk.  Decoding Lab Results  Clinical staff conducted group or individual video education with verbal and written material and guidebook.  Patient learns that lab test reflects one measurement whose values change over time and are influenced by many factors, including medication, stress, sleep, exercise, food, hydration, pre-existing medical conditions, and more. It is recommended to use the knowledge from this video to become more involved with your lab results and evaluate your numbers to speak with your doctor.   Diseases of Our Time - Overview  Clinical staff conducted group or individual video education with verbal and written material and guidebook.  Patient learns that according to the CDC, 50% to 70% of chronic diseases (such as obesity, type 2 diabetes, elevated lipids, hypertension, and heart  disease) are avoidable through lifestyle improvements including healthier food choices, listening to satiety cues, and increased physical activity.  Sleep Disorders Clinical staff conducted group or individual video education with verbal and written material and guidebook.  Patient learns how good quality and duration of sleep are important to overall health and well-being. Patient also learns about sleep disorders and how they impact health along with recommendations to address them, including discussing with a physician.  Nutrition  Dining Out - Part 2 Clinical staff conducted group or individual video education with verbal and written material and guidebook.  Patient learns how to plan ahead and communicate in order to maximize their dining experience in a healthy and nutritious manner. Included are recommended food choices  based on the type of restaurant the patient is visiting.   Fueling a Best boy conducted group or individual video education with verbal and written material and guidebook.  There is a strong connection between our food choices and our health. Diseases like obesity and type 2 diabetes are very prevalent and are in large-part due to lifestyle choices. The Pritikin Eating Plan provides plenty of food and hunger-curbing satisfaction. It is easy to follow, affordable, and helps reduce health risks.  Menu Workshop  Clinical staff conducted group or individual video education with verbal and written material and guidebook.  Patient learns that restaurant meals can sabotage health goals because they are often packed with calories, fat, sodium, and sugar. Recommendations include strategies to plan ahead and to communicate with the manager, chef, or server to help order a healthier meal.  Planning Your Eating Strategy  Clinical staff conducted group or individual video education with verbal and written material and guidebook.  Patient learns about the [place] and its benefit of reducing the risk of disease. The [place] does not focus on calories. Instead, it emphasizes high-quality, nutrient-rich foods. By knowing the characteristics of the foods, we choose, we can determine their calorie density and make informed decisions.  Targeting Your Nutrition Priorities  Clinical staff conducted group or individual video education with verbal and written material and guidebook.  Patient learns that lifestyle habits have a tremendous impact on disease risk and progression. This video provides eating and physical activity recommendations based on your personal health goals, such as reducing LDL cholesterol, losing weight, preventing or controlling type 2 diabetes, and reducing high blood pressure.  Vitamins and Minerals  Clinical staff conducted group or individual video  education with verbal and written material and guidebook.  Patient learns different ways to obtain key vitamins and minerals, including through a recommended healthy diet. It is important to discuss all supplements you take with your doctor.   Healthy Mind-Set    Smoking Cessation  Clinical staff conducted group or individual video education with verbal and written material and guidebook.  Patient learns that cigarette smoking and tobacco addiction pose a serious health risk which affects millions of people. Stopping smoking will significantly reduce the risk of heart disease, lung disease, and many forms of cancer. Recommended strategies for quitting are covered, including working with your doctor to develop a successful plan.  Culinary   Becoming a Financial trader conducted group or individual video education with verbal and written material and guidebook.  Patient learns that cooking at home can be healthy, cost-effective, quick, and puts them in control. Keys to cooking healthy recipes will include looking at your recipe, assessing your equipment needs, planning ahead, making it simple,  choosing cost-effective seasonal ingredients, and limiting the use of added fats, salts, and sugars.  Cooking - Breakfast and Snacks  Clinical staff conducted group or individual video education with verbal and written material and guidebook.  Patient learns how important breakfast is to satiety and nutrition through the entire day. Recommendations include key foods to eat during breakfast to help stabilize blood sugar levels and to prevent overeating at meals later in the day. Planning ahead is also a key component.  Cooking - Human resources officer conducted group or individual video education with verbal and written material and guidebook.  Patient learns eating strategies to improve overall health, including an approach to cook more at home. Recommendations include thinking of  animal protein as a side on your plate rather than center stage and focusing instead on lower calorie dense options like vegetables, fruits, whole grains, and plant-based proteins, such as beans. Making sauces in large quantities to freeze for later and leaving the skin on your vegetables are also recommended to maximize your experience.  Cooking - Healthy Salads and Dressing Clinical staff conducted group or individual video education with verbal and written material and guidebook.  Patient learns that vegetables, fruits, whole grains, and legumes are the foundations of the [place]. Recommendations include how to incorporate each of these in flavorful and healthy salads, and how to create homemade salad dressings. Proper handling of ingredients is also covered. Cooking - Soups and [employer] - Soups and Desserts Clinical staff conducted group or individual video education with verbal and written material and guidebook.  Patient learns that Pritikin soups and desserts make for easy, nutritious, and delicious snacks and meal components that are low in sodium, fat, sugar, and calorie density, while high in vitamins, minerals, and filling fiber. Recommendations include simple and healthy ideas for soups and desserts.   Overview     The Pritikin Solution Program Overview Clinical staff conducted group or individual video education with verbal and written material and guidebook.  Patient learns that the results of the [place] Program have been documented in more than 100 articles published in peer-reviewed journals, and the benefits include reducing risk factors for (and, in some cases, even reversing) high cholesterol, high blood pressure, type 2 diabetes, obesity, and more! An overview of the three key pillars of the Pritikin Program will be covered: eating well, doing regular exercise, and having a healthy mind-set.  WORKSHOPS  Exercise: Exercise Basics: Building Your Action  Plan Clinical staff led group instruction and group discussion with PowerPoint presentation and patient guidebook. To enhance the learning environment the use of posters, models and videos may be added. At the conclusion of this workshop, patients will comprehend the difference between physical activity and exercise, as well as the benefits of incorporating both, into their routine. Patients will understand the FITT (Frequency, Intensity, Time, and Type) principle and how to use it to build an exercise action plan. In addition, safety concerns and other considerations for exercise and cardiac rehab will be addressed by the presenter. The purpose of this lesson is to promote a comprehensive and effective weekly exercise routine in order to improve patients' overall level of fitness.   Managing Heart Disease: Your Path to a Healthier Heart Clinical staff led group instruction and group discussion with PowerPoint presentation and patient guidebook. To enhance the learning environment the use of posters, models and videos may be added.At the conclusion of this workshop, patients will understand the anatomy and physiology  of the heart. Additionally, they will understand how Pritikin's three pillars impact the risk factors, the progression, and the management of heart disease.  The purpose of this lesson is to provide a high-level overview of the heart, heart disease, and how the Pritikin lifestyle positively impacts risk factors.  Exercise Biomechanics Clinical staff led group instruction and group discussion with PowerPoint presentation and patient guidebook. To enhance the learning environment the use of posters, models and videos may be added. Patients will learn how the structural parts of their bodies function and how these functions impact their daily activities, movement, and exercise. Patients will learn how to promote a neutral spine, learn how to manage pain, and identify ways to improve  their physical movement in order to promote healthy living. The purpose of this lesson is to expose patients to common physical limitations that impact physical activity. Participants will learn practical ways to adapt and manage aches and pains, and to minimize their effect on regular exercise. Patients will learn how to maintain good posture while sitting, walking, and lifting.  Balance Training and Fall Prevention  Clinical staff led group instruction and group discussion with PowerPoint presentation and patient guidebook. To enhance the learning environment the use of posters, models and videos may be added. At the conclusion of this workshop, patients will understand the importance of their sensorimotor skills (vision, proprioception, and the vestibular system) in maintaining their ability to balance as they age. Patients will apply a variety of balancing exercises that are appropriate for their current level of function. Patients will understand the common causes for poor balance, possible solutions to these problems, and ways to modify their physical environment in order to minimize their fall risk. The purpose of this lesson is to teach patients about the importance of maintaining balance as they age and ways to minimize their risk of falling.  WORKSHOPS   Nutrition:  Fueling a Scientist, research (physical sciences) led group instruction and group discussion with PowerPoint presentation and patient guidebook. To enhance the learning environment the use of posters, models and videos may be added. Patients will review the foundational principles of the [place] and understand what constitutes a serving size in each of the food groups. Patients will also learn Pritikin-friendly foods that are better choices when away from home and review make-ahead meal and snack options. Calorie density will be reviewed and applied to three nutrition priorities: weight maintenance, weight loss, and weight  gain. The purpose of this lesson is to reinforce (in a group setting) the key concepts around what patients are recommended to eat and how to apply these guidelines when away from home by planning and selecting Pritikin-friendly options. Patients will understand how calorie density may be adjusted for different weight management goals.  Mindful Eating  Clinical staff led group instruction and group discussion with PowerPoint presentation and patient guidebook. To enhance the learning environment the use of posters, models and videos may be added. Patients will briefly review the concepts of the [place] and the importance of low-calorie dense foods. The concept of mindful eating will be introduced as well as the importance of paying attention to internal hunger signals. Triggers for non-hunger eating and techniques for dealing with triggers will be explored. The purpose of this lesson is to provide patients with the opportunity to review the basic principles of the [place], discuss the value of eating mindfully and how to measure internal cues of hunger and fullness using the Hunger Scale.  Patients will also discuss reasons for non-hunger eating and learn strategies to use for controlling emotional eating.  Targeting Your Nutrition Priorities Clinical staff led group instruction and group discussion with PowerPoint presentation and patient guidebook. To enhance the learning environment the use of posters, models and videos may be added. Patients will learn how to determine their genetic susceptibility to disease by reviewing their family history. Patients will gain insight into the importance of diet as part of an overall healthy lifestyle in mitigating the impact of genetics and other environmental insults. The purpose of this lesson is to provide patients with the opportunity to assess their personal nutrition priorities by looking at their family history, their own health history  and current risk factors. Patients will also be able to discuss ways of prioritizing and modifying the [employer] for their highest risk areas  Menu  Clinical staff led group instruction and group discussion with PowerPoint presentation and patient guidebook. To enhance the learning environment the use of posters, models and videos may be added. Using menus brought in from [employer], or printed from [employer], patients will apply the [place] dining out guidelines that were presented in the [employer] video. Patients will also be able to practice these guidelines in a variety of provided scenarios. The purpose of this lesson is to provide patients with the opportunity to practice hands-on learning of the [place] with actual menus and practice scenarios.  Label Reading Clinical staff led group instruction and group discussion with PowerPoint presentation and patient guidebook. To enhance the learning environment the use of posters, models and videos may be added. Patients will review and discuss the Pritikin label reading guidelines presented in Pritikin's Label Reading Educational series video. Using fool labels brought in from local grocery stores and markets, patients will apply the label reading guidelines and determine if the packaged food meet the Pritikin guidelines. The purpose of this lesson is to provide patients with the opportunity to review, discuss, and practice hands-on learning of the Pritikin Label Reading guidelines with actual packaged food labels. [place] Workshops are designed to teach patients ways to prepare quick, simple, and affordable recipes at home. The importance of nutrition's role in chronic disease risk reduction is reflected in its emphasis in the overall Pritikin program. By learning how to prepare essential core Pritikin Eating Plan recipes, patients will increase control over  what they eat; be able to customize the flavor of foods without the use of added salt, sugar, or fat; and improve the quality of the food they consume. By learning a set of core recipes which are easily assembled, quickly prepared, and affordable, patients are more likely to prepare more healthy foods at home. These workshops focus on convenient breakfasts, simple entres, side dishes, and desserts which can be prepared with minimal effort and are consistent with nutrition recommendations for cardiovascular risk reduction. Cooking [employer] are taught by a Engineer, materials (RD) who has been trained by the [employer]. The chef or RD has a clear understanding of the importance of minimizing - if not completely eliminating - added fat, sugar, and sodium in recipes. Throughout the series of [place] Workshop sessions, patients will learn about healthy ingredients and efficient methods of cooking to build confidence in their capability to prepare    Cooking School weekly topics:  Adding Flavor- Sodium-Free  Fast and Healthy Breakfasts  Powerhouse Plant-Based Proteins  Satisfying  Salads and Dressings  Simple Sides and Sauces  International Cuisine-Spotlight on the [place] Zones  Delicious Desserts  Savory Soups  [employer] - Meals in a Snap  Tasty Appetizers and Snacks  Comforting Weekend Breakfasts  One-Pot Wonders   Fast Evening Meals  Easy Entertaining  Personalizing Your Pritikin Plate  WORKSHOPS   Healthy Mindset (Psychosocial): New Thoughts, New Behaviors Clinical staff led group instruction and group discussion with PowerPoint presentation and patient guidebook. To enhance the learning environment the use of posters, models and videos may be added. Patients will learn and practice techniques for developing effective health and lifestyle goals. Patients will be able to effectively apply the goal setting process learned to develop at least one new  personal goal.  The purpose of this lesson is to expose patients to a new skill set of behavior modification techniques such as techniques setting SMART goals, overcoming barriers, and achieving new thoughts and new behaviors.  Managing Moods and Relationships Clinical staff led group instruction and group discussion with PowerPoint presentation and patient guidebook. To enhance the learning environment the use of posters, models and videos may be added. Patients will learn how emotional and chronic stress factors can impact their health and relationships. They will learn healthy ways to manage their moods and utilize positive coping mechanisms. In addition, ICR patients will learn ways to improve communication skills. The purpose of this lesson is to expose patients to ways of understanding how one's mood and health are intimately connected. Developing a healthy outlook can help build positive relationships and connections with others. Patients will understand the importance of utilizing effective communication skills that include actively listening and being heard. They will learn and understand the importance of the "4 Cs" and especially Connections in fostering of a Healthy Mind-Set.  Healthy Sleep for a Healthy Heart Clinical staff led group instruction and group discussion with PowerPoint presentation and patient guidebook. To enhance the learning environment the use of posters, models and videos may be added. At the conclusion of this workshop, patients will be able to demonstrate knowledge of the importance of sleep to overall health, well-being, and quality of life. They will understand the symptoms of, and treatments for, common sleep disorders. Patients will also be able to identify daytime and nighttime behaviors which impact sleep, and they will be able to apply these tools to help manage sleep-related challenges. The purpose of this lesson is to provide patients with a general overview of sleep  and outline the importance of quality sleep. Patients will learn about a few of the most common sleep disorders. Patients will also be introduced to the concept of "sleep hygiene," and discover ways to self-manage certain sleeping problems through simple daily behavior changes. Finally, the workshop will motivate patients by clarifying the links between quality sleep and their goals of heart-healthy living.   Recognizing and Reducing Stress Clinical staff led group instruction and group discussion with PowerPoint presentation and patient guidebook. To enhance the learning environment the use of posters, models and videos may be added. At the conclusion of this workshop, patients will be able to understand the types of stress reactions, differentiate between acute and chronic stress, and recognize the impact that chronic stress has on their health. They will also be able to apply different coping mechanisms, such as reframing negative self-talk. Patients will have the opportunity to practice a variety of stress management techniques, such as deep abdominal breathing, progressive muscle relaxation, and/or guided imagery.  The purpose of this  lesson is to educate patients on the role of stress in their lives and to provide healthy techniques for coping with it.  Learning Barriers/Preferences:  Learning Barriers/Preferences - 11/21/21 1205       Learning Barriers/Preferences   Learning Barriers None    Learning Preferences Pictoral;Video;Computer/Internet             Education Topics:  Knowledge Questionnaire Score:  Knowledge Questionnaire Score - 11/21/21 1205       Knowledge Questionnaire Score   Pre Score 22/28             Core Components/Risk Factors/Patient Goals at Admission:  Personal Goals and Risk Factors at Admission - 11/21/21 1206       Core Components/Risk Factors/Patient Goals on Admission    Weight Management Yes    Intervention Weight Management: Develop a  combined nutrition and exercise program designed to reach desired caloric intake, while maintaining appropriate intake of nutrient and fiber, sodium and fats, and appropriate energy expenditure required for the weight goal.;Weight Management: Provide education and appropriate resources to help participant work on and attain dietary goals.    Tobacco Cessation Yes    Number of packs per day .5    Intervention Assist the participant in steps to quit. Provide individualized education and counseling about committing to Tobacco Cessation, relapse prevention, and pharmacological support that can be provided by physician.;Advice worker, assist with locating and accessing local/national Quit Smoking programs, and support quit date choice.    Expected Outcomes Short Term: Will demonstrate readiness to quit, by selecting a quit date.;Short Term: Will quit all tobacco product use, adhering to prevention of relapse plan.;Long Term: Complete abstinence from all tobacco products for at least 12 months from quit date.    Hypertension Yes    Intervention Provide education on lifestyle modifcations including regular physical activity/exercise, weight management, moderate sodium restriction and increased consumption of fresh fruit, vegetables, and low fat dairy, alcohol moderation, and smoking cessation.;Monitor prescription use compliance.    Expected Outcomes Short Term: Continued assessment and intervention until BP is < 140/30m HG in hypertensive participants. < 130/835mHG in hypertensive participants with diabetes, heart failure or chronic kidney disease.;Long Term: Maintenance of blood pressure at goal levels.    Lipids Yes    Intervention Provide education and support for participant on nutrition & aerobic/resistive exercise along with prescribed medications to achieve LDL '70mg'$ , HDL >'40mg'$ .    Expected Outcomes Short Term: Participant states understanding of desired cholesterol values and is  compliant with medications prescribed. Participant is following exercise prescription and nutrition guidelines.;Long Term: Cholesterol controlled with medications as prescribed, with individualized exercise RX and with personalized nutrition plan. Value goals: LDL < '70mg'$ , HDL > 40 mg.    Stress Yes    Intervention Offer individual and/or small group education and counseling on adjustment to heart disease, stress management and health-related lifestyle change. Teach and support self-help strategies.;Refer participants experiencing significant psychosocial distress to appropriate mental health specialists for further evaluation and treatment. When possible, include family members and significant others in education/counseling sessions.    Expected Outcomes Short Term: Participant demonstrates changes in health-related behavior, relaxation and other stress management skills, ability to obtain effective social support, and compliance with psychotropic medications if prescribed.;Long Term: Emotional wellbeing is indicated by absence of clinically significant psychosocial distress or social isolation.             Core Components/Risk Factors/Patient Goals Review:    Core Components/Risk Factors/Patient Goals at Discharge (Final  Review):    ITP Comments:  ITP Comments     Row Name 11/21/21 1037           ITP Comments Dr Fransico Him MD, Medical Director. Pritikin Education Transport planner. Initial Orientation Packet Reveiwed with the patient                Comments: Participant attended orientation for the cardiac rehabilitation program on  11/21/2021  to perform initial intake and exercise walk test. Patient introduced to the [place] education and orientation packet was reviewed. Completed 6-minute walk test, measurements, initial ITP, and exercise prescription. Vital signs stable. Telemetry-normal sinus rhythm, first degree heart block t wave inversion this has  been previously documented. Gerald Stabs did report having some fatigue due to wearing steel toe shoes this morning, otherwise asymptomatic.Harrell Gave RN BSN   Service time was from 0800 to [phone].

## 2021-11-21 NOTE — Progress Notes (Signed)
Cardiac Rehab Medication Review by a Nurse  Does the patient  feel that his/her medications are working for him/her?  YES   Has the patient been experiencing any side effects to the medications prescribed?   NO  Does the patient measure his/her own blood pressure or blood glucose at home?  YES   Does the patient have any problems obtaining medications due to transportation or finances?    No  Understanding of regimen: fair Understanding of indications: fair Potential of compliance: good    Nurse  comments: Thomas Spears is taking his medications as prescribed and has a fair understanding of what his medications are for. Thomas Spears checks his blood pressures two- three times a way.    Harrell Gave RN 11/21/2021 12:50 PM

## 2021-11-21 NOTE — Telephone Encounter (Signed)
-----   Message from Shirley Friar, PA-C sent at 11/21/2021  8:19 AM EDT ----- Regarding: RE: Cardiac rehb s/p ICD placement Good morning! Thank so much for reaching out!   Here are the instructions we give patients post implant!  1. Do not lift your arm above shoulder height for 1 week after your procedure.  After 7 days, you may progress as below.  8 days -> hand to same ear 9 days -> hand to top of head 10 days -> hand to opposite shoulder Still no reaching the affected arm BEHIND your back, or lifting something that is above your head.   Do not lift, push, pull, or carry anything over 10 pounds with the affected arm until 6 weeks after your procedure.   Hopefully this helps!  Frontal plane range of motion is clear after 10 days. Nothing in the affected hand heavier than a gallon of milk x 6 weeks.    ----- Message ----- From: Rowe Pavy, RN Sent: 11/21/2021   8:15 AM EDT To: Shirley Friar, PA-C Subject: Cardiac rehb s/p ICD placement                  Jonni Sanger,  The above patient is scheduled for Cardiac rehab orientation/Walk test this morning.  Noted in his history, recent ICD placement on 9/6.   Completed wound check on 9/13 satisfactorily. Tahjae is tentatively scheduled to begin exercise next week.  Ok to proceed as scheduled? Ok to use upper body exercises s/p 6 weeks - 10/18?  Hand weights limitation?  Thanks so much, we are looking forward to working with him in rehab.  Cherre Huger, BSN Cardiac and Training and development officer

## 2021-11-27 ENCOUNTER — Ambulatory Visit (HOSPITAL_COMMUNITY): Payer: Commercial Managed Care - HMO

## 2021-11-29 ENCOUNTER — Telehealth (HOSPITAL_COMMUNITY): Payer: Self-pay | Admitting: *Deleted

## 2021-11-29 ENCOUNTER — Ambulatory Visit (HOSPITAL_COMMUNITY): Payer: Commercial Managed Care - HMO

## 2021-11-29 NOTE — Telephone Encounter (Signed)
Pt absent from Cardiac rehab x 2 sessions with no call or message left. Called and spoke to pt who is 'sick" in the bed for the last 3 days.  Pt reports feeling feverish, achy and very tired.  Plans to contact his PCP for an appt and/or further instructions.  Reminded pt that he will need to be symptom free x 48 hours without the use of medication However should he rule in positive for flu or Covid the time out will be extended.  Will contact pt toward the end of this week for check in. Cherre Huger, BSN Cardiac and Training and development officer

## 2021-12-01 ENCOUNTER — Ambulatory Visit (HOSPITAL_COMMUNITY): Payer: Commercial Managed Care - HMO

## 2021-12-04 ENCOUNTER — Ambulatory Visit (HOSPITAL_COMMUNITY): Payer: Commercial Managed Care - HMO

## 2021-12-06 ENCOUNTER — Ambulatory Visit (HOSPITAL_COMMUNITY): Payer: Commercial Managed Care - HMO

## 2021-12-07 ENCOUNTER — Telehealth (HOSPITAL_COMMUNITY): Payer: Self-pay | Admitting: *Deleted

## 2021-12-07 NOTE — Progress Notes (Signed)
Cardiac Individual Treatment Plan  Patient Details  Name: Thomas Spears MRN: [id number] Date of Birth: 12/20/1975 Referring Provider:   Flowsheet Row CARDIAC REHAB PHASE II ORIENTATION from 11/21/2021 in [facility]  Referring Provider Gwyndolyn Kaufman, MD       Initial Encounter Date:  [place] PHASE II ORIENTATION from 11/21/2021 in [facility]  Date 11/21/21       Visit Diagnosis: 09/20/21 S/P DES RCA, S/P ICD implant 11/01/21  Patient's Home Medications on Admission:  Current Outpatient Medications:    acetaminophen (TYLENOL) 500 MG tablet, Take 2 tablets (1,000 mg total) by mouth every 6 (six) hours as needed (pain)., Disp: 30 tablet, Rfl: 0   aspirin EC 81 MG tablet, Take 1 tablet (81 mg total) by mouth every evening. Swallow whole., Disp: 30 tablet, Rfl: 3   atorvastatin (LIPITOR) 40 MG tablet, Take 1 tablet (40 mg total) by mouth every evening., Disp: 90 tablet, Rfl: 3   carvedilol (COREG) 25 MG tablet, Take 1.5 tablets (37.5 mg total) by mouth 2 (two) times daily., Disp: 270 tablet, Rfl: 3   clopidogrel (PLAVIX) 75 MG tablet, TAKE 1 TABLET(75 MG) BY MOUTH DAILY, Disp: 30 tablet, Rfl: 0   dapagliflozin propanediol (FARXIGA) 10 MG TABS tablet, Take 1 tablet (10 mg total) by mouth daily., Disp: 30 tablet, Rfl: 5   hydrALAZINE (APRESOLINE) 25 MG tablet, Take 25 mg by mouth 3 (three) times daily., Disp: , Rfl:    isosorbide mononitrate (IMDUR) 30 MG 24 hr tablet, Take 0.5 tablets (15 mg total) by mouth daily., Disp: 45 tablet, Rfl: 3   sacubitril-valsartan (ENTRESTO) 97-103 MG, Take 1 tablet by mouth 2 (two) times daily., Disp: 180 tablet, Rfl: 3   spironolactone (ALDACTONE) 25 MG tablet, Take 0.5 tablets (12.5 mg total) by mouth daily., Disp: 30 tablet, Rfl: 1  Past Medical History: Past Medical History:  Diagnosis Date   Aortic regurgitation    Ascending aorta dilation (HCC)    CAD  (coronary artery disease), native coronary artery 09/20/2021   Chronic combined systolic and diastolic CHF (congestive heart failure) ([facility])    Community acquired pneumonia    HLD (hyperlipidemia)    Hypertension    Hypertensive crisis 05/22/2020   Hypertrophic cardiomyopathy ([facility])    Left ventricular hypertrophy    Mitral regurgitation    Mobitz type 1 second degree AV block    NSVT (nonsustained ventricular tachycardia) (HCC)    Premature atrial contractions    PVC's (premature ventricular contractions)    Tension headache 06/21/2020   Tobacco use    Tobacco use 09/20/2021    Tobacco Use: Social History   Tobacco Use  Smoking Status Every Day   Packs/day: 0.50   Years: 37.00   Total pack years: 18.50   Types: Cigarettes  Smokeless Tobacco Never  Tobacco Comments   Patient given 1-800-quit-now.  Agreed to having referral faxed for smoking cessation    Labs: Review Flowsheet       Latest Ref Rng & Units 05/23/2020 12/16/2020 09/04/2021  Labs for ITP Cardiac and Pulmonary Rehab  Cholestrol 100 - 199 mg/dL 137  119  -  LDL (calc) 0 - 99 mg/dL 96  64  -  HDL-C >39 mg/dL 25  28  -  Trlycerides 0 - 149 mg/dL 80  154  -  Hemoglobin A1c 4.8 - 5.6 % 5.4  - -  TCO2 22 - 32 mmol/L - - 26  Capillary Blood Glucose: No results found for: "GLUCAP"   Exercise Target Goals: Exercise Program Goal: Individual exercise prescription set using results from initial 6 min walk test and THRR while considering  patient's activity barriers and safety.   Exercise Prescription Goal: Initial exercise prescription builds to 30-45 minutes a day of aerobic activity, 2-3 days per week.  Home exercise guidelines will be given to patient during program as part of exercise prescription that the participant will acknowledge.  Activity Barriers & Risk Stratification:  Activity Barriers & Cardiac Risk Stratification - 11/21/21 1153       Activity Barriers & Cardiac Risk Stratification    Activity Barriers Joint Problems;Deconditioning;Balance Concerns    Cardiac Risk Stratification High             6 Minute Walk:  6 Minute Walk     Row Name 11/21/21 0908         6 Minute Walk   Phase Initial     Distance 1419 feet     Walk Time 6 minutes     # of Rest Breaks 0     MPH 2.7     METS 3.97     RPE 9     Perceived Dyspnea  0     VO2 Peak 13.9     Symptoms Yes (comment)     Comments Feet are tired due to walkin in shoes with steel toes     Resting HR 67 bpm     Resting BP 142/84     Resting Oxygen Saturation  98 %     Exercise Oxygen Saturation  during 6 min walk 97 %     Max Ex. HR 81 bpm     Max Ex. BP 140/90     2 Minute Post BP 128/86              Oxygen Initial Assessment:   Oxygen Re-Evaluation:   Oxygen Discharge (Final Oxygen Re-Evaluation):   Initial Exercise Prescription:  Initial Exercise Prescription - 11/21/21 1200       Date of Initial Exercise RX and Referring Provider   Date 11/21/21    Referring Provider Gwyndolyn Kaufman, MD    Expected Discharge Date 01/03/22      Recumbant Bike   Level 2    RPM 60    Watts 60    Minutes 15    METs 3.9      Track   Laps 14    Minutes 15    METs 2.62      Prescription Details   Frequency (times per week) 3    Duration Progress to 30 minutes of continuous aerobic without signs/symptoms of physical distress      Intensity   THRR 40-80% of Max Heartrate 70-139    Ratings of Perceived Exertion 11-13    Perceived Dyspnea 0-4      Progression   Progression Continue progressive overload as per policy without signs/symptoms or physical distress.      Resistance Training   Training Prescription Yes    Weight 4 lbs    Reps 10-15             Perform Capillary Blood Glucose checks as needed.  Exercise Prescription Changes:   Exercise Comments:   Exercise Goals and Review:   Exercise Goals     Row Name 11/21/21 1200             Exercise Goals   Increase  Physical Activity Yes  Intervention Provide advice, education, support and counseling about physical activity/exercise needs.;Develop an individualized exercise prescription for aerobic and resistive training based on initial evaluation findings, risk stratification, comorbidities and participant's personal goals.       Expected Outcomes Short Term: Attend rehab on a regular basis to increase amount of physical activity.;Long Term: Add in home exercise to make exercise part of routine and to increase amount of physical activity.;Long Term: Exercising regularly at least 3-5 days a week.       Increase Strength and Stamina Yes       Intervention Provide advice, education, support and counseling about physical activity/exercise needs.;Develop an individualized exercise prescription for aerobic and resistive training based on initial evaluation findings, risk stratification, comorbidities and participant's personal goals.       Expected Outcomes Short Term: Increase workloads from initial exercise prescription for resistance, speed, and METs.;Short Term: Perform resistance training exercises routinely during rehab and add in resistance training at home;Long Term: Improve cardiorespiratory fitness, muscular endurance and strength as measured by increased METs and functional capacity (6MWT)       Able to understand and use rate of perceived exertion (RPE) scale Yes       Intervention Provide education and explanation on how to use RPE scale       Expected Outcomes Short Term: Able to use RPE daily in rehab to express subjective intensity level;Long Term:  Able to use RPE to guide intensity level when exercising independently       Knowledge and understanding of Target Heart Rate Range (THRR) Yes       Intervention Provide education and explanation of THRR including how the numbers were predicted and where they are located for reference       Expected Outcomes Short Term: Able to state/look up THRR;Short  Term: Able to use daily as guideline for intensity in rehab;Long Term: Able to use THRR to govern intensity when exercising independently       Understanding of Exercise Prescription Yes       Intervention Provide education, explanation, and written materials on patient's individual exercise prescription       Expected Outcomes Short Term: Able to explain program exercise prescription;Long Term: Able to explain home exercise prescription to exercise independently                Exercise Goals Re-Evaluation :   Discharge Exercise Prescription (Final Exercise Prescription Changes):   Nutrition:  Target Goals: Understanding of nutrition guidelines, daily intake of sodium '1500mg'$ , cholesterol '200mg'$ , calories 30% from fat and 7% or less from saturated fats, daily to have 5 or more servings of fruits and vegetables.  Biometrics:  Pre Biometrics - 11/21/21 0800       Pre Biometrics   Waist Circumference 45.75 inches    Hip Circumference 44.5 inches    Waist to Hip Ratio 1.03 %    Triceps Skinfold 16 mm    % Body Fat 31.7 %    Grip Strength 38 kg    Flexibility 17 in    Single Leg Stand 3.36 seconds              Nutrition Therapy Plan and Nutrition Goals:   Nutrition Assessments:  MEDIFICTS Score Key: ?70 Need to make dietary changes  40-70 Heart Healthy Diet ? 40 Therapeutic Level Cholesterol Diet    Picture Your Plate Scores: <35 Unhealthy dietary pattern with much room for improvement. 41-50 Dietary pattern unlikely to meet recommendations for good health and  room for improvement. 51-60 More healthful dietary pattern, with some room for improvement.  >60 Healthy dietary pattern, although there may be some specific behaviors that could be improved.    Nutrition Goals Re-Evaluation:   Nutrition Goals Re-Evaluation:   Nutrition Goals Discharge (Final Nutrition Goals Re-Evaluation):   Psychosocial: Target Goals: Acknowledge presence or absence of  significant depression and/or stress, maximize coping skills, provide positive support system. Participant is able to verbalize types and ability to use techniques and skills needed for reducing stress and depression.  Initial Review & Psychosocial Screening:  Initial Psych Review & Screening - 11/21/21 1244       Initial Review   Current issues with Current Anxiety/Panic;Current Depression;Current Stress Concerns   Thomas Spears reports having some depression due to his recent hospitaliztion and ICD placement.   Source of Stress Concerns Chronic Illness;Financial    Comments Thomas Spears has some stress concerns due to being without income during his cardiac event. Thomas Spears was out of work for a week      Bath? Yes   Thomas Spears has his fiancee and his 80 year old daughter for support     Barriers   Psychosocial barriers to participate in program The patient should benefit from training in stress management and relaxation.      Screening Interventions   Interventions Encouraged to exercise;Provide feedback about the scores to participant    Expected Outcomes Long Term Goal: Stressors or current issues are controlled or eliminated.;Long Term goal: The participant improves quality of Life and PHQ9 Scores as seen by post scores and/or verbalization of changes             Quality of Life Scores:  Quality of Life - 11/21/21 1205       Quality of Life   Select Quality of Life      Quality of Life Scores   Health/Function Pre 27.2 %    Socioeconomic Pre 27.88 %    Psych/Spiritual Pre 30 %    Family Pre 30 %    GLOBAL Pre 28.31 %            Scores of 19 and below usually indicate a poorer quality of life in these areas.  A difference of  2-3 points is a clinically meaningful difference.  A difference of 2-3 points in the total score of the Quality of Life Index has been associated with significant improvement in overall quality of life, self-image, physical symptoms,  and general health in studies assessing change in quality of life.  PHQ-9: Review Flowsheet  More data exists      11/21/2021 07/13/2020 06/21/2020 06/07/2020 05/31/2020  Depression screen PHQ 2/9  Decreased Interest 0 0 0 0 0  Down, Depressed, Hopeless 1 0 0 0 0  PHQ - 2 Score 1 0 0 0 0  Altered sleeping - 0 0 0 1  Tired, decreased energy - 0 0 0 0  Change in appetite - 0 0 0 0  Feeling bad or failure about yourself  - 0 0 0 0  Trouble concentrating - 0 0 0 0  Moving slowly or fidgety/restless - 0 0 0 0  Suicidal thoughts - 0 0 0 0  PHQ-9 Score - 0 0 0 1  Difficult doing work/chores - - Not difficult at all Not difficult at all Somewhat difficult   Interpretation of Total Score  Total Score Depression Severity:  1-4 = Minimal depression, 5-9 = Mild depression, 10-14 =  Moderate depression, 15-19 = Moderately severe depression, 20-27 = Severe depression   Psychosocial Evaluation and Intervention:   Psychosocial Re-Evaluation:   Psychosocial Discharge (Final Psychosocial Re-Evaluation):   Vocational Rehabilitation: Provide vocational rehab assistance to qualifying candidates.   Vocational Rehab Evaluation & Intervention:  Vocational Rehab - 11/21/21 1247       Initial Vocational Rehab Evaluation & Intervention   Assessment shows need for Vocational Rehabilitation No   Thomas Spears works full time and does not need vocational rehab at this time            Education: Education Goals: Education classes will be provided on a weekly basis, covering required topics. Participant will state understanding/return demonstration of topics presented.     Core Videos: Exercise    Move It!  Clinical staff conducted group or individual video education with verbal and written material and guidebook.  Patient learns the recommended Pritikin exercise program. Exercise with the goal of living a long, healthy life. Some of the health benefits of exercise include controlled diabetes, healthier  blood pressure levels, improved cholesterol levels, improved heart and lung capacity, improved sleep, and better body composition. Everyone should speak with their doctor before starting or changing an exercise routine.  Biomechanical Limitations Clinical staff conducted group or individual video education with verbal and written material and guidebook.  Patient learns how biomechanical limitations can impact exercise and how we can mitigate and possibly overcome limitations to have an impactful and balanced exercise routine.  Body Composition Clinical staff conducted group or individual video education with verbal and written material and guidebook.  Patient learns that body composition (ratio of muscle mass to fat mass) is a key component to assessing overall fitness, rather than body weight alone. Increased fat mass, especially visceral belly fat, can put [place] at increased risk for metabolic syndrome, type 2 diabetes, heart disease, and even death. It is recommended to combine diet and exercise (cardiovascular and resistance training) to improve your body composition. Seek guidance from your physician and exercise physiologist before implementing an exercise routine.  Exercise Action Plan Clinical staff conducted group or individual video education with verbal and written material and guidebook.  Patient learns the recommended strategies to achieve and enjoy long-term exercise adherence, including variety, self-motivation, self-efficacy, and positive decision making. Benefits of exercise include fitness, good health, weight management, more energy, better sleep, less stress, and overall well-being.  Medical   Heart Disease Risk Reduction Clinical staff conducted group or individual video education with verbal and written material and guidebook.  Patient learns our heart is our most vital organ as it circulates oxygen, nutrients, white blood cells, and hormones throughout the entire body, and  carries waste away. Data supports a plant-based eating plan like the Pritikin Program for its effectiveness in slowing progression of and reversing heart disease. The video provides a number of recommendations to address heart disease.   Metabolic Syndrome and Belly Fat  Clinical staff conducted group or individual video education with verbal and written material and guidebook.  Patient learns what metabolic syndrome is, how it leads to heart disease, and how one can reverse it and keep it from coming back. You have metabolic syndrome if you have 3 of the following 5 criteria: abdominal obesity, high blood pressure, high triglycerides, low HDL cholesterol, and high blood sugar.  Hypertension and Heart Disease Clinical staff conducted group or individual video education with verbal and written material and guidebook.  Patient learns that high blood pressure, or hypertension, is very  common in the [place]. Hypertension is largely due to excessive salt intake, but other important risk factors include being overweight, physical inactivity, drinking too much alcohol, smoking, and not eating enough potassium from fruits and vegetables. High blood pressure is a leading risk factor for heart attack, stroke, congestive heart failure, dementia, kidney failure, and premature death. Long-term effects of excessive salt intake include stiffening of the arteries and thickening of heart muscle and organ damage. Recommendations include ways to reduce hypertension and the risk of heart disease.  Diseases of Our Time - Focusing on Diabetes Clinical staff conducted group or individual video education with verbal and written material and guidebook.  Patient learns why the best way to stop diseases of our time is prevention, through food and other lifestyle changes. Medicine (such as prescription pills and surgeries) is often only a Band-Aid on the problem, not a long-term solution. Most common diseases of our time  include obesity, type 2 diabetes, hypertension, heart disease, and cancer. The Pritikin Program is recommended and has been proven to help reduce, reverse, and/or prevent the damaging effects of metabolic syndrome.  Nutrition   Overview of the Pritikin Eating Plan  Clinical staff conducted group or individual video education with verbal and written material and guidebook.  Patient learns about the [place] for disease risk reduction. The [place] emphasizes a wide variety of unrefined, minimally-processed carbohydrates, like fruits, vegetables, whole grains, and legumes. Go, Caution, and Stop food choices are explained. Plant-based and lean animal proteins are emphasized. Rationale provided for low sodium intake for blood pressure control, low added sugars for blood sugar stabilization, and low added fats and oils for coronary artery disease risk reduction and weight management.  Calorie Density  Clinical staff conducted group or individual video education with verbal and written material and guidebook.  Patient learns about calorie density and how it impacts the Pritikin Eating Plan. Knowing the characteristics of the food you choose will help you decide whether those foods will lead to weight gain or weight loss, and whether you want to consume more or less of them. Weight loss is usually a side effect of the Pritikin Eating Plan because of its focus on low calorie-dense foods.  Label Reading  Clinical staff conducted group or individual video education with verbal and written material and guidebook.  Patient learns about the Pritikin recommended label reading guidelines and corresponding recommendations regarding calorie density, added sugars, sodium content, and whole grains.  Dining Out - Part 1  Clinical staff conducted group or individual video education with verbal and written material and guidebook.  Patient learns that restaurant meals can be sabotaging because they  can be so high in calories, fat, sodium, and/or sugar. Patient learns recommended strategies on how to positively address this and avoid unhealthy pitfalls.  Facts on Fats  Clinical staff conducted group or individual video education with verbal and written material and guidebook.  Patient learns that lifestyle modifications can be just as effective, if not more so, as many medications for lowering your risk of heart disease. A Pritikin lifestyle can help to reduce your risk of inflammation and atherosclerosis (cholesterol build-up, or plaque, in the artery walls). Lifestyle interventions such as dietary choices and physical activity address the cause of atherosclerosis. A review of the types of fats and their impact on blood cholesterol levels, along with dietary recommendations to reduce fat intake is also included.  Nutrition Action Plan  Clinical staff conducted group or individual video education  with verbal and written material and guidebook.  Patient learns how to incorporate Pritikin recommendations into their lifestyle. Recommendations include planning and keeping personal health goals in mind as an important part of their success.  Healthy Mind-Set    Healthy Minds, Bodies, Hearts  Clinical staff conducted group or individual video education with verbal and written material and guidebook.  Patient learns how to identify when they are stressed. Video will discuss the impact of that stress, as well as the many benefits of stress management. Patient will also be introduced to stress management techniques. The way we think, act, and feel has an impact on our hearts.  How Our Thoughts Can Heal Our Hearts  Clinical staff conducted group or individual video education with verbal and written material and guidebook.  Patient learns that negative thoughts can cause depression and anxiety. This can result in negative lifestyle behavior and serious health problems. Cognitive behavioral therapy is an  effective method to help control our thoughts in order to change and improve our emotional outlook.  Additional Videos:  Exercise    Improving Performance  Clinical staff conducted group or individual video education with verbal and written material and guidebook.  Patient learns to use a non-linear approach by alternating intensity levels and lengths of time spent exercising to help burn more calories and lose more body fat. Cardiovascular exercise helps improve heart health, metabolism, hormonal balance, blood sugar control, and recovery from fatigue. Resistance training improves strength, endurance, balance, coordination, reaction time, metabolism, and muscle mass. Flexibility exercise improves circulation, posture, and balance. Seek guidance from your physician and exercise physiologist before implementing an exercise routine and learn your capabilities and proper form for all exercise.  Introduction to Yoga  Clinical staff conducted group or individual video education with verbal and written material and guidebook.  Patient learns about yoga, a discipline of the coming together of mind, breath, and body. The benefits of yoga include improved flexibility, improved range of motion, better posture and core strength, increased lung function, weight loss, and positive self-image. Yoga's heart health benefits include lowered blood pressure, healthier heart rate, decreased cholesterol and triglyceride levels, improved immune function, and reduced stress. Seek guidance from your physician and exercise physiologist before implementing an exercise routine and learn your capabilities and proper form for all exercise.  Medical   Aging: Enhancing Your Quality of Life  Clinical staff conducted group or individual video education with verbal and written material and guidebook.  Patient learns key strategies and recommendations to stay in good physical health and enhance quality of life, such as prevention  strategies, having an advocate, securing a [place], and keeping a list of medications and system for tracking them. It also discusses how to avoid risk for bone loss.  Biology of Weight Control  Clinical staff conducted group or individual video education with verbal and written material and guidebook.  Patient learns that weight gain occurs because we consume more calories than we burn (eating more, moving less). Even if your body weight is normal, you may have higher ratios of fat compared to muscle mass. Too much body fat puts you at increased risk for cardiovascular disease, heart attack, stroke, type 2 diabetes, and obesity-related cancers. In addition to exercise, following the [place] can help reduce your risk.  Decoding Lab Results  Clinical staff conducted group or individual video education with verbal and written material and guidebook.  Patient learns that lab test reflects one measurement  whose values change over time and are influenced by many factors, including medication, stress, sleep, exercise, food, hydration, pre-existing medical conditions, and more. It is recommended to use the knowledge from this video to become more involved with your lab results and evaluate your numbers to speak with your doctor.   Diseases of Our Time - Overview  Clinical staff conducted group or individual video education with verbal and written material and guidebook.  Patient learns that according to the CDC, 50% to 70% of chronic diseases (such as obesity, type 2 diabetes, elevated lipids, hypertension, and heart disease) are avoidable through lifestyle improvements including healthier food choices, listening to satiety cues, and increased physical activity.  Sleep Disorders Clinical staff conducted group or individual video education with verbal and written material and guidebook.  Patient learns how good quality and duration of sleep are important to  overall health and well-being. Patient also learns about sleep disorders and how they impact health along with recommendations to address them, including discussing with a physician.  Nutrition  Dining Out - Part 2 Clinical staff conducted group or individual video education with verbal and written material and guidebook.  Patient learns how to plan ahead and communicate in order to maximize their dining experience in a healthy and nutritious manner. Included are recommended food choices based on the type of restaurant the patient is visiting.   Fueling a Best boy conducted group or individual video education with verbal and written material and guidebook.  There is a strong connection between our food choices and our health. Diseases like obesity and type 2 diabetes are very prevalent and are in large-part due to lifestyle choices. The Pritikin Eating Plan provides plenty of food and hunger-curbing satisfaction. It is easy to follow, affordable, and helps reduce health risks.  Menu Workshop  Clinical staff conducted group or individual video education with verbal and written material and guidebook.  Patient learns that restaurant meals can sabotage health goals because they are often packed with calories, fat, sodium, and sugar. Recommendations include strategies to plan ahead and to communicate with the manager, chef, or server to help order a healthier meal.  Planning Your Eating Strategy  Clinical staff conducted group or individual video education with verbal and written material and guidebook.  Patient learns about the [place] and its benefit of reducing the risk of disease. The [place] does not focus on calories. Instead, it emphasizes high-quality, nutrient-rich foods. By knowing the characteristics of the foods, we choose, we can determine their calorie density and make informed decisions.  Targeting Your Nutrition Priorities  Clinical staff  conducted group or individual video education with verbal and written material and guidebook.  Patient learns that lifestyle habits have a tremendous impact on disease risk and progression. This video provides eating and physical activity recommendations based on your personal health goals, such as reducing LDL cholesterol, losing weight, preventing or controlling type 2 diabetes, and reducing high blood pressure.  Vitamins and Minerals  Clinical staff conducted group or individual video education with verbal and written material and guidebook.  Patient learns different ways to obtain key vitamins and minerals, including through a recommended healthy diet. It is important to discuss all supplements you take with your doctor.   Healthy Mind-Set    Smoking Cessation  Clinical staff conducted group or individual video education with verbal and written material and guidebook.  Patient learns that cigarette smoking and tobacco addiction pose a serious health risk  which affects millions of people. Stopping smoking will significantly reduce the risk of heart disease, lung disease, and many forms of cancer. Recommended strategies for quitting are covered, including working with your doctor to develop a successful plan.  Culinary   Becoming a Financial trader conducted group or individual video education with verbal and written material and guidebook.  Patient learns that cooking at home can be healthy, cost-effective, quick, and puts them in control. Keys to cooking healthy recipes will include looking at your recipe, assessing your equipment needs, planning ahead, making it simple, choosing cost-effective seasonal ingredients, and limiting the use of added fats, salts, and sugars.  Cooking - Breakfast and Snacks  Clinical staff conducted group or individual video education with verbal and written material and guidebook.  Patient learns how important breakfast is to satiety and nutrition  through the entire day. Recommendations include key foods to eat during breakfast to help stabilize blood sugar levels and to prevent overeating at meals later in the day. Planning ahead is also a key component.  Cooking - Human resources officer conducted group or individual video education with verbal and written material and guidebook.  Patient learns eating strategies to improve overall health, including an approach to cook more at home. Recommendations include thinking of animal protein as a side on your plate rather than center stage and focusing instead on lower calorie dense options like vegetables, fruits, whole grains, and plant-based proteins, such as beans. Making sauces in large quantities to freeze for later and leaving the skin on your vegetables are also recommended to maximize your experience.  Cooking - Healthy Salads and Dressing Clinical staff conducted group or individual video education with verbal and written material and guidebook.  Patient learns that vegetables, fruits, whole grains, and legumes are the foundations of the [place]. Recommendations include how to incorporate each of these in flavorful and healthy salads, and how to create homemade salad dressings. Proper handling of ingredients is also covered. Cooking - Soups and [employer] - Soups and Desserts Clinical staff conducted group or individual video education with verbal and written material and guidebook.  Patient learns that Pritikin soups and desserts make for easy, nutritious, and delicious snacks and meal components that are low in sodium, fat, sugar, and calorie density, while high in vitamins, minerals, and filling fiber. Recommendations include simple and healthy ideas for soups and desserts.   Overview     The Pritikin Solution Program Overview Clinical staff conducted group or individual video education with verbal and written material and guidebook.  Patient learns that the  results of the [place] Program have been documented in more than 100 articles published in peer-reviewed journals, and the benefits include reducing risk factors for (and, in some cases, even reversing) high cholesterol, high blood pressure, type 2 diabetes, obesity, and more! An overview of the three key pillars of the Pritikin Program will be covered: eating well, doing regular exercise, and having a healthy mind-set.  WORKSHOPS  Exercise: Exercise Basics: Building Your Action Plan Clinical staff led group instruction and group discussion with PowerPoint presentation and patient guidebook. To enhance the learning environment the use of posters, models and videos may be added. At the conclusion of this workshop, patients will comprehend the difference between physical activity and exercise, as well as the benefits of incorporating both, into their routine. Patients will understand the FITT (Frequency, Intensity, Time, and Type) principle and how to use it to  build an exercise action plan. In addition, safety concerns and other considerations for exercise and cardiac rehab will be addressed by the presenter. The purpose of this lesson is to promote a comprehensive and effective weekly exercise routine in order to improve patients' overall level of fitness.   Managing Heart Disease: Your Path to a Healthier Heart Clinical staff led group instruction and group discussion with PowerPoint presentation and patient guidebook. To enhance the learning environment the use of posters, models and videos may be added.At the conclusion of this workshop, patients will understand the anatomy and physiology of the heart. Additionally, they will understand how Pritikin's three pillars impact the risk factors, the progression, and the management of heart disease.  The purpose of this lesson is to provide a high-level overview of the heart, heart disease, and how the Pritikin lifestyle positively impacts risk  factors.  Exercise Biomechanics Clinical staff led group instruction and group discussion with PowerPoint presentation and patient guidebook. To enhance the learning environment the use of posters, models and videos may be added. Patients will learn how the structural parts of their bodies function and how these functions impact their daily activities, movement, and exercise. Patients will learn how to promote a neutral spine, learn how to manage pain, and identify ways to improve their physical movement in order to promote healthy living. The purpose of this lesson is to expose patients to common physical limitations that impact physical activity. Participants will learn practical ways to adapt and manage aches and pains, and to minimize their effect on regular exercise. Patients will learn how to maintain good posture while sitting, walking, and lifting.  Balance Training and Fall Prevention  Clinical staff led group instruction and group discussion with PowerPoint presentation and patient guidebook. To enhance the learning environment the use of posters, models and videos may be added. At the conclusion of this workshop, patients will understand the importance of their sensorimotor skills (vision, proprioception, and the vestibular system) in maintaining their ability to balance as they age. Patients will apply a variety of balancing exercises that are appropriate for their current level of function. Patients will understand the common causes for poor balance, possible solutions to these problems, and ways to modify their physical environment in order to minimize their fall risk. The purpose of this lesson is to teach patients about the importance of maintaining balance as they age and ways to minimize their risk of falling.  WORKSHOPS   Nutrition:  Fueling a Scientist, research (physical sciences) led group instruction and group discussion with PowerPoint presentation and patient guidebook. To enhance  the learning environment the use of posters, models and videos may be added. Patients will review the foundational principles of the [place] and understand what constitutes a serving size in each of the food groups. Patients will also learn Pritikin-friendly foods that are better choices when away from home and review make-ahead meal and snack options. Calorie density will be reviewed and applied to three nutrition priorities: weight maintenance, weight loss, and weight gain. The purpose of this lesson is to reinforce (in a group setting) the key concepts around what patients are recommended to eat and how to apply these guidelines when away from home by planning and selecting Pritikin-friendly options. Patients will understand how calorie density may be adjusted for different weight management goals.  Mindful Eating  Clinical staff led group instruction and group discussion with PowerPoint presentation and patient guidebook. To enhance the learning environment the use of posters,  models and videos may be added. Patients will briefly review the concepts of the [place] and the importance of low-calorie dense foods. The concept of mindful eating will be introduced as well as the importance of paying attention to internal hunger signals. Triggers for non-hunger eating and techniques for dealing with triggers will be explored. The purpose of this lesson is to provide patients with the opportunity to review the basic principles of the [place], discuss the value of eating mindfully and how to measure internal cues of hunger and fullness using the Hunger Scale. Patients will also discuss reasons for non-hunger eating and learn strategies to use for controlling emotional eating.  Targeting Your Nutrition Priorities Clinical staff led group instruction and group discussion with PowerPoint presentation and patient guidebook. To enhance the learning environment the use of posters,  models and videos may be added. Patients will learn how to determine their genetic susceptibility to disease by reviewing their family history. Patients will gain insight into the importance of diet as part of an overall healthy lifestyle in mitigating the impact of genetics and other environmental insults. The purpose of this lesson is to provide patients with the opportunity to assess their personal nutrition priorities by looking at their family history, their own health history and current risk factors. Patients will also be able to discuss ways of prioritizing and modifying the [place] for their highest risk areas  Menu  Clinical staff led group instruction and group discussion with PowerPoint presentation and patient guidebook. To enhance the learning environment the use of posters, models and videos may be added. Using menus brought in from [employer], or printed from [employer], patients will apply the [place] dining out guidelines that were presented in the [employer] video. Patients will also be able to practice these guidelines in a variety of provided scenarios. The purpose of this lesson is to provide patients with the opportunity to practice hands-on learning of the [place] with actual menus and practice scenarios.  Label Reading Clinical staff led group instruction and group discussion with PowerPoint presentation and patient guidebook. To enhance the learning environment the use of posters, models and videos may be added. Patients will review and discuss the Pritikin label reading guidelines presented in Pritikin's Label Reading Educational series video. Using fool labels brought in from local grocery stores and markets, patients will apply the label reading guidelines and determine if the packaged food meet the Pritikin guidelines. The purpose of this lesson is to provide patients with the opportunity to review, discuss, and  practice hands-on learning of the Pritikin Label Reading guidelines with actual packaged food labels. [place] Workshops are designed to teach patients ways to prepare quick, simple, and affordable recipes at home. The importance of nutrition's role in chronic disease risk reduction is reflected in its emphasis in the overall Pritikin program. By learning how to prepare essential core Pritikin Eating Plan recipes, patients will increase control over what they eat; be able to customize the flavor of foods without the use of added salt, sugar, or fat; and improve the quality of the food they consume. By learning a set of core recipes which are easily assembled, quickly prepared, and affordable, patients are more likely to prepare more healthy foods at home. These workshops focus on convenient breakfasts, simple entres, side dishes, and desserts which can be prepared with minimal effort and are consistent with nutrition recommendations for cardiovascular  risk reduction. Cooking [employer] are taught by a Engineer, materials (RD) who has been trained by the [employer]. The chef or RD has a clear understanding of the importance of minimizing - if not completely eliminating - added fat, sugar, and sodium in recipes. Throughout the series of [place] Workshop sessions, patients will learn about healthy ingredients and efficient methods of cooking to build confidence in their capability to prepare    Cooking School weekly topics:  Adding Flavor- Sodium-Free  Fast and Healthy Breakfasts  Powerhouse Plant-Based Proteins  Satisfying Salads and Dressings  Simple Sides and Sauces  International Cuisine-Spotlight on the [place] Zones  Delicious Desserts  Savory Soups  Efficiency Cooking - Meals in a Snap  Tasty Appetizers and Snacks  Comforting Weekend Breakfasts  One-Pot Wonders   Fast Evening Meals  Easy [place] (Psychosocial): New Thoughts, New Behaviors Clinical staff led group instruction and group discussion with PowerPoint presentation and patient guidebook. To enhance the learning environment the use of posters, models and videos may be added. Patients will learn and practice techniques for developing effective health and lifestyle goals. Patients will be able to effectively apply the goal setting process learned to develop at least one new personal goal.  The purpose of this lesson is to expose patients to a new skill set of behavior modification techniques such as techniques setting SMART goals, overcoming barriers, and achieving new thoughts and new behaviors.  Managing Moods and Relationships Clinical staff led group instruction and group discussion with PowerPoint presentation and patient guidebook. To enhance the learning environment the use of posters, models and videos may be added. Patients will learn how emotional and chronic stress factors can impact their health and relationships. They will learn healthy ways to manage their moods and utilize positive coping mechanisms. In addition, ICR patients will learn ways to improve communication skills. The purpose of this lesson is to expose patients to ways of understanding how one's mood and health are intimately connected. Developing a healthy outlook can help build positive relationships and connections with others. Patients will understand the importance of utilizing effective communication skills that include actively listening and being heard. They will learn and understand the importance of the "4 Cs" and especially Connections in fostering of a Healthy Mind-Set.  Healthy Sleep for a Healthy Heart Clinical staff led group instruction and group discussion with PowerPoint presentation and patient guidebook. To enhance the learning environment the use of posters, models and videos may be added. At the  conclusion of this workshop, patients will be able to demonstrate knowledge of the importance of sleep to overall health, well-being, and quality of life. They will understand the symptoms of, and treatments for, common sleep disorders. Patients will also be able to identify daytime and nighttime behaviors which impact sleep, and they will be able to apply these tools to help manage sleep-related challenges. The purpose of this lesson is to provide patients with a general overview of sleep and outline the importance of quality sleep. Patients will learn about a few of the most common sleep disorders. Patients will also be introduced to the concept of "sleep hygiene," and discover ways to self-manage certain sleeping problems through simple daily behavior changes. Finally, the workshop will motivate patients by clarifying the links between quality sleep and their goals of heart-healthy living.   Recognizing and Reducing Stress Clinical staff led group instruction and group discussion  with PowerPoint presentation and patient guidebook. To enhance the learning environment the use of posters, models and videos may be added. At the conclusion of this workshop, patients will be able to understand the types of stress reactions, differentiate between acute and chronic stress, and recognize the impact that chronic stress has on their health. They will also be able to apply different coping mechanisms, such as reframing negative self-talk. Patients will have the opportunity to practice a variety of stress management techniques, such as deep abdominal breathing, progressive muscle relaxation, and/or guided imagery.  The purpose of this lesson is to educate patients on the role of stress in their lives and to provide healthy techniques for coping with it.  Learning Barriers/Preferences:  Learning Barriers/Preferences - 11/21/21 1205       Learning Barriers/Preferences   Learning Barriers None    Learning Preferences  Pictoral;Video;Computer/Internet             Education Topics:  Knowledge Questionnaire Score:  Knowledge Questionnaire Score - 11/21/21 1205       Knowledge Questionnaire Score   Pre Score 22/28             Core Components/Risk Factors/Patient Goals at Admission:  Personal Goals and Risk Factors at Admission - 11/21/21 1206       Core Components/Risk Factors/Patient Goals on Admission    Weight Management Yes    Intervention Weight Management: Develop a combined nutrition and exercise program designed to reach desired caloric intake, while maintaining appropriate intake of nutrient and fiber, sodium and fats, and appropriate energy expenditure required for the weight goal.;Weight Management: Provide education and appropriate resources to help participant work on and attain dietary goals.    Tobacco Cessation Yes    Number of packs per day .5    Intervention Assist the participant in steps to quit. Provide individualized education and counseling about committing to Tobacco Cessation, relapse prevention, and pharmacological support that can be provided by physician.;Advice worker, assist with locating and accessing local/national Quit Smoking programs, and support quit date choice.    Expected Outcomes Short Term: Will demonstrate readiness to quit, by selecting a quit date.;Short Term: Will quit all tobacco product use, adhering to prevention of relapse plan.;Long Term: Complete abstinence from all tobacco products for at least 12 months from quit date.    Hypertension Yes    Intervention Provide education on lifestyle modifcations including regular physical activity/exercise, weight management, moderate sodium restriction and increased consumption of fresh fruit, vegetables, and low fat dairy, alcohol moderation, and smoking cessation.;Monitor prescription use compliance.    Expected Outcomes Short Term: Continued assessment and intervention until BP is < 140/29m  HG in hypertensive participants. < 130/832mHG in hypertensive participants with diabetes, heart failure or chronic kidney disease.;Long Term: Maintenance of blood pressure at goal levels.    Lipids Yes    Intervention Provide education and support for participant on nutrition & aerobic/resistive exercise along with prescribed medications to achieve LDL '70mg'$ , HDL >'40mg'$ .    Expected Outcomes Short Term: Participant states understanding of desired cholesterol values and is compliant with medications prescribed. Participant is following exercise prescription and nutrition guidelines.;Long Term: Cholesterol controlled with medications as prescribed, with individualized exercise RX and with personalized nutrition plan. Value goals: LDL < '70mg'$ , HDL > 40 mg.    Stress Yes    Intervention Offer individual and/or small group education and counseling on adjustment to heart disease, stress management and health-related lifestyle change. Teach and support self-help strategies.;Refer participants  experiencing significant psychosocial distress to appropriate mental health specialists for further evaluation and treatment. When possible, include family members and significant others in education/counseling sessions.    Expected Outcomes Short Term: Participant demonstrates changes in health-related behavior, relaxation and other stress management skills, ability to obtain effective social support, and compliance with psychotropic medications if prescribed.;Long Term: Emotional wellbeing is indicated by absence of clinically significant psychosocial distress or social isolation.             Core Components/Risk Factors/Patient Goals Review:    Core Components/Risk Factors/Patient Goals at Discharge (Final Review):    ITP Comments:  ITP Comments     Row Name 11/21/21 1037 12/07/21 0756         ITP Comments Dr Fransico Him MD, Medical Director. Pritikin Education Transport planner. Initial  Orientation Packet Reveiwed with the patient Thomas Spears had completed orientation/assessment hopeful to start soon once he is feeling better and no longer having any symptoms.               Comments: Adonai had completed his orientation/assessment session.  Unfortunately developed respiratory symptoms and fatigue.  Unable to return to exercise until symptom free. Cherre Huger, BSN Cardiac and Training and development officer

## 2021-12-07 NOTE — Telephone Encounter (Signed)
Called to check in with Thomas Spears to see how he is coming along with his sickness.  Unable to reach pt on the number completed - not a working number. Called Thomas Spears who is listed as a significant other and has active DPR on file.  Spoke to Thomas Spears who reports that Thomas Spears is feeling better however he has returned to work and he doesn't know if he can do Cardiac Rehab.  Pt works in [employer] from 7:30 to 6:00. Reviewed with Thomas Spears options we could do if he was able to modify his work day. Such as two day a week for shorter than 8 week duration.  Thomas Spears will have Thomas Spears give [place] a call tomorrow. Thomas Spears, BSN Cardiac and Training and development officer

## 2021-12-08 ENCOUNTER — Ambulatory Visit (HOSPITAL_COMMUNITY): Payer: Commercial Managed Care - HMO

## 2021-12-11 ENCOUNTER — Ambulatory Visit (HOSPITAL_COMMUNITY): Payer: Commercial Managed Care - HMO

## 2021-12-13 ENCOUNTER — Ambulatory Visit (HOSPITAL_COMMUNITY): Payer: Commercial Managed Care - HMO

## 2021-12-15 ENCOUNTER — Ambulatory Visit (HOSPITAL_COMMUNITY): Payer: Commercial Managed Care - HMO

## 2021-12-17 NOTE — Progress Notes (Unsigned)
Cardiology Office Note:    Date:  12/18/2021   ID:  Thomas Spears, DOB 1975/06/20, MRN [id number]  PCP:  Ezequiel Essex, MD   [facility] Providers Cardiologist:  Freada Bergeron, MD {    Referring MD: Ezequiel Essex, MD    History of Present Illness:    Thomas Spears is a 46 y.o. male with a hx of systolic heart failure with LVEF 25% 04/2020 that improved to 60-65% in 02/2021, HCM on CMR on 07/2020, HTN, mild-to-moderate MR, severe aortic dilation measuring 71m who presents to clinic for follow-up.  [facility] where he presented with dyspnea and HA found to have Bps 220s/150s. CTA chest showed no PE but demonstrated thoracic aortic aneurysm 4.7cm as well as evidence of pulmonary HTN. He was placed on a nitro gtt. Trop 400>433. TTE 05/23/20 showed LVEF 25% with global hypokinesis, severe LVH, G2DD, mild RV dysfunction, severe LAE, moderate RAE, mild-to-moderate MR, trivial AI, severe aortic dilation, small pericardial effusion. Prior to completed work-up, the patient left AMA.   He was seen in clinic 07/23/2018 where he reported several years of not taking his medication and uncontrolled hypertension.  Also regularly drinking energy drinks, soda, smoking 2 packs/day.  He used to be a heavy drinker.  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  He was taking all of his blood pressure medications and feeling overall well.  He displayed NYHA I-II symptoms.  He was recommended for stress MRI to assess for ischemia and evidence of amyloidosis. [facility]  He was referred to the hypertension clinic.   Seen by pharmacy team 08/09/20 and Carvedilol increased to 12.'5mg'$  QD.    He had cardiac stress MRI 08/19/20 showing basal to mid inferior stress perfusion defect consistent with ischemia.  He had moderate LV dilation with moderate systolic  dysfunction LVEF 34%.  Asymmetric LV hypertrophy measuring up to 39 mL basal septum differential including hypertrophic cardiomyopathy versus hypertensive heart disease.  No evidence of cardiac amyloidosis.  Normal RV size with mild systolic dysfunction, moderate aortic regurgitation, mild to moderate mitral regurgitation, ascending aortic aneurysm measuring 45 mm.Cardiac catheterization performed 7/07-2020 by Dr. HEllyn Hackshowing diffuse mild to moderate multivessel disease, no culprit lesion to explain reduced LVEF, no previously placed stent.   Seen by pharmacy team 09/06/20 and transitioned from indapamide to spironolactone. Seen 09/14/20 with EDelene Lollincreased to 49-'51mg'$  BID. 09/28/20 pharmacy increased Carvedilol to '25mg'$  BID and Spironolactone to '25mg'$  QD, Entresto increased to 97/'103mg'$ .    Seen by pharmacy team 12/02/20. He was continuing to decrease smoking. [facility]. His Carvedilol was increased to 37.'5mg'$  QD as diastolic blood pressure still above goal. Transition from spironolactone to eplenerone was considered but deferred as he was without insurance.   Saw C27.$[id number]$11/2020 where he was doing well. BP 130-140s at that time. Echo on 03/20/21 showed improved LVEF to 60-65%.  Seen in clinic on 03/2021 where he was having atypical chest pain when laying flat at night but was otherwise doing well.   He has had 2 ER visits over the past month for chest pain. Trop 30-40s. He underwent CTA dissection study that showed possible small dissection flap above the coronary cusp. He was recommended for a gated CT chest that showed linear defect along the aortic root but this is unchanged and possible PE in the RUL,  but d-dimer was normal with greater suspicion that this was related to a pulmonary nodule. Dr. Roxan Hockey was consulted who recommended repeat CTA which was negative for any dissection or PE. He was discharged home.  Was last seen in  clinic by Dr. Caryl Comes on 11/01/21 for consideration of ICD which he underwent on 11/01/21.  Today, ***   Past Medical History:  Diagnosis Date   Aortic regurgitation    Ascending aorta dilation (HCC)    CAD (coronary artery disease), native coronary artery 09/20/2021   Chronic combined systolic and diastolic CHF (congestive heart failure) ([place])    Community acquired pneumonia    HLD (hyperlipidemia)    Hypertension    Hypertensive crisis 05/22/2020   Hypertrophic cardiomyopathy ([place])    Left ventricular hypertrophy    Mitral regurgitation    Mobitz type 1 second degree AV block    NSVT (nonsustained ventricular tachycardia) (HCC)    Premature atrial contractions    PVC's (premature ventricular contractions)    Tension headache 06/21/2020   Tobacco use    Tobacco use 09/20/2021    Past Surgical History:  Procedure Laterality Date   CARDIAC CATHETERIZATION     CAROTID STENT INSERTION     CORONARY STENT INTERVENTION N/A 09/20/2021   Procedure: CORONARY STENT INTERVENTION;  Surgeon: [name], MD;  Location: [place] [facility];  Service: Cardiovascular;  Laterality: N/A;   ICD IMPLANT N/A 11/01/2021   Procedure: ICD IMPLANT;  Surgeon: Deboraha Sprang, MD;  Location: [place] [facility];  Service: Cardiovascular;  Laterality: N/A;   KNEE SURGERY Left    LEFT HEART CATH AND CORONARY ANGIOGRAPHY N/A 08/31/2020   Procedure: LEFT HEART CATH AND CORONARY ANGIOGRAPHY;  Surgeon: Leonie Man, MD;  Location: [place] [facility];  Service: Cardiovascular;  Laterality: N/A;   LEFT HEART CATH AND CORONARY ANGIOGRAPHY N/A 09/20/2021   Procedure: LEFT HEART CATH AND CORONARY ANGIOGRAPHY;  Surgeon: [name], MD;  Location: [place] [facility];  Service: Cardiovascular;  Laterality: N/A;   TONSILLECTOMY      Current Medications: No outpatient medications have been marked as taking for the 12/19/21 encounter (Appointment) with Freada Bergeron, MD.     Allergies:   Mushroom  extract complex   Social History   Socioeconomic History   Marital status: Single    Spouse name: Not on file   Number of children: 2   Years of education: 8   Highest education level: 8th grade  Occupational History   Not on file  Tobacco Use   Smoking status: Every Day    Packs/day: 0.50    Years: 37.00    Total pack years: 18.50    Types: Cigarettes   Smokeless tobacco: Never   Tobacco comments:    Patient given 1-800-quit-now.  Agreed to having referral faxed for smoking cessation  Substance and Sexual Activity   Alcohol use: Not Currently   Drug use: Never   Sexual activity: Yes  Other Topics Concern   Not on file  Social History Narrative   Not on file   Social Determinants of Health   Financial Resource Strain: Not on file  Food Insecurity: Not on file  Transportation Needs: Not on file  Physical Activity: Not on file  Stress: Not on file  Social Connections: Not on file     Family History: The patient's family history includes CAD in his father.  ROS:   Please see the history of present illness.    Review  of Systems  Constitutional:  Negative for fever and weight loss.  HENT:  Negative for nosebleeds.   Eyes:  Negative for photophobia and pain.  Respiratory:  Negative for cough, hemoptysis and wheezing.   Cardiovascular:  Positive for chest pain and palpitations. Negative for orthopnea, claudication, leg swelling and PND.  Gastrointestinal:  Negative for abdominal pain and vomiting.  Genitourinary:  Negative for frequency and urgency.  Musculoskeletal:  Negative for falls.  Neurological:  Positive for headaches. Negative for tingling and seizures.     EKGs/Labs/Other Studies Reviewed:    The following studies were reviewed today: CTA chest 08/2021: FINDINGS: Cardiovascular: There is preferential opacification of the thoracic aorta. The aortic valve is trileaflet. The thoracic aorta is normal in course and caliber without evidence of intramural  hematoma or dissection. The thoracic aorta is mildly dilated measuring 5.7 by 5.7 cm at the sinuses of Valsalva (image # 81/14 and 89/9). The ascending aorta is mildly dilated measuring 4.1 x 4.2 cm (image # 81/14 and 107/15). The descending thoracic aorta is mildly dilated measuring 3.2 x 3.0 cm just beyond the takeoff of the left subclavian artery (image # 123/14 and 132/15). No significant atherosclerotic plaque identified. The arch vasculature is widely patent proximally and demonstrates normal anatomic configuration.   Global cardiac size is within normal limits, however, there is left ventricular hypertrophy again noted with asymmetric septal hypertrophy noted. This results in marked narrowing of the left ventricular outflow tract with a cross-sectional area of the a outflow tract approximately 7 mm x 25 mm best seen on coronal reformat # 87 and sagittal reformat # 123. This was better assessed on MRI examination of 08/19/2020. Moderate coronary artery calcification. No pericardial effusion. Central pulmonary arteries are of normal caliber. There is adequate opacification of the pulmonary arterial tree and no intraluminal filling defect is identified through the segmental level to suggest acute pulmonary embolism.   Mediastinum/Nodes: No enlarged mediastinal, hilar, or axillary lymph nodes. Thyroid gland, trachea, and esophagus demonstrate no significant findings.   Lungs/Pleura: Lungs are clear. No pleural effusion or pneumothorax.   Upper Abdomen: No acute abnormality.   Musculoskeletal: No chest wall abnormality. No acute or significant osseous findings.   Review of the MIP images confirms the above findings.   IMPRESSION: 1. No pulmonary embolism. 2. Mild aneurysmal dilation of the thoracic aorta at the sinuses of Valsalva, ascending aorta, and descending thoracic aorta. Maximal diameter 5.7 cm at the sinuses of Valsalva. No evidence of intramural hematoma or  dissection. 3. 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 This was better assessed on MRI examination of 08/19/2020. 4. Moderate coronary artery calcification. TTE 03/20/21: IMPRESSIONS     1. No obstructive gradient. Left ventricular ejection fraction, by  estimation, is 60 to 65%. The left ventricle has normal function. The left  ventricle has no regional wall motion abnormalities. There is severe  concentric left ventricular hypertrophy.  Left ventricular diastolic parameters are consistent with Grade II  diastolic dysfunction (pseudonormalization).   2. Right ventricular systolic function is normal. The right ventricular  size is normal.   3. Left atrial size was moderately dilated.   4. The mitral valve is normal in structure. Mild mitral valve  regurgitation. No evidence of mitral stenosis.   5. The aortic valve is normal in structure. Aortic valve regurgitation is  mild. No aortic stenosis is present.   6. Aortic dilatation noted. There is moderate dilatation of  the aortic  root, measuring 48 mm. There is moderate dilatation of the ascending  aorta, measuring 45 mm.   7. The inferior vena cava is normal in size with greater than 50%  respiratory variability, suggesting right atrial pressure of 3 mmHg.   Comparison(s): The left ventricular function has improved. EF 25%, severe  LVH, AOV 24m, small pericardial effusion.   Conclusion(s)/Recommendation(s): Findings consistent with hypertrophic  cardiomyopathy. Prior Cardiac MRI has been performed.   Echo 03/20/2021:  1. No obstructive gradient. Left ventricular ejection fraction, by  estimation, is 60 to 65%. The left ventricle has normal function. The left  ventricle has no regional wall motion abnormalities. There is severe  concentric left ventricular hypertrophy.  Left ventricular diastolic parameters are consistent with Grade II   diastolic dysfunction (pseudonormalization).   2. Right ventricular systolic function is normal. The right ventricular  size is normal.   3. Left atrial size was moderately dilated.   4. The mitral valve is normal in structure. Mild mitral valve  regurgitation. No evidence of mitral stenosis.   5. The aortic valve is normal in structure. Aortic valve regurgitation is  mild. No aortic stenosis is present.   6. Aortic dilatation noted. There is moderate dilatation of the aortic  root, measuring 48 mm. There is moderate dilatation of the ascending  aorta, measuring 45 mm.   7. The inferior vena cava is normal in size with greater than 50%  respiratory variability, suggesting right atrial pressure of 3 mmHg.   Comparison(s): The left ventricular function has improved. EF 25%, severe  LVH, AOV 585m small pericardial effusion.   Conclusion(s)/Recommendation(s): Findings consistent with hypertrophic  cardiomyopathy. Prior Cardiac MRI has been performed.   CT Chest 12/21/2020: COMPARISON:  CT angiography of the chest of March of 2022.   FINDINGS: Cardiovascular: Ascending aorta of 4.7 cm in transverse dimension stable compared to previous imaging. Descending thoracic aorta 3.4 cm also unchanged. Coronal dimension of the ascending thoracic aorta approximately 4.3 cm stable when measured in a similar fashion as well on the previous imaging study. The heart size remains enlarged. Coronary artery calcification of LEFT coronary circulation similar to the prior study. No substantial pericardial effusion. Normal caliber of central pulmonary vessels. Limited assessment of cardiovascular structures given lack of intravenous contrast.   Mediastinum/Nodes: No thoracic inlet, axillary, mediastinal or hilar adenopathy. Esophagus grossly normal.   Lungs/Pleura: Lungs are clear. Airways are patent. Mild paraseptal emphysema at the lung apices.   Upper Abdomen: No acute abnormality.    Musculoskeletal: No chest wall mass or suspicious bone lesions identified.   IMPRESSION: Stable aneurysmal dilation of the ascending thoracic aorta measuring up to 4.7 cm in transverse dimension. 4.3 cm in greatest coronal dimension unchanged from previous imaging. Ascending thoracic aortic aneurysm. Recommend semi-annual imaging followup by CTA or MRA and referral to cardiothoracic surgery if not already obtained. This recommendation follows 2010 ACCF/AHA/AATS/ACR/ASA/SCA/SCAI/SIR/STS/SVM Guidelines for the Diagnosis and Management of Patients With Thoracic Aortic Disease. Circulation. 2010; 121: : O756-E332Aortic aneurysm NOS (ICD10-I71.9)   Stable cardiomegaly and coronary artery calcification.   Mild paraseptal emphysema at the lung apices.   Aortic Atherosclerosis (ICD10-I70.0) and Emphysema (ICD10-J43.9).  Monitor 09/20/2020: Patch wear time was 8 days and 12 hours. Predominant rhythm was NSR with average HR 79bpm (ranging from 34-169bpm). There were 3 runs of nonsustained VT with longest/fastest lasting 8 beats at a rate of 169bpm Rare SVE (<1%), rare VE (<1%) Mobitz type I block seen during sleep Patient triggered events correlated  with NSR No Afib, sustained arrhythmias or significant pauses     Patch Wear Time:  8 days and 12 hours (2022-07-11T21:01:27-0400 to 2022-07-20T09:54:00-0400)   Patient had a min HR of 34 bpm, max HR of 169 bpm, and avg HR of 79 bpm. Predominant underlying rhythm was Sinus Rhythm. First Degree AV Block was present. 3 Ventricular Tachycardia runs occurred, the run with the fastest interval lasting 8 beats with a  max rate of 169 bpm (avg 130 bpm); the run with the fastest interval was also the longest. Second Degree AV Block-Mobitz I (Wenckebach) was present. Isolated SVEs were rare (<1.0%), and no SVE Couplets or SVE Triplets were present. Isolated VEs were rare  (<1.0%, 170), VE Couplets were rare (<1.0%, 12), and VE Triplets were rare (<1.0%,  6). Ventricular Trigeminy was present.   LHC 11/01/26 LV end diastolic pressure is moderately elevated. There is no aortic valve stenosis. Mid LM to Dist LM lesion is 20% stenosed. Ost LAD to Prox LAD lesion is 30% stenosed. Mid LAD lesion is 45% stenosed @ 1st Diag (40% pre & 50% post 1st Diag) Prox RCA lesion is 30% stenosed. Dist RCA lesion is 60% stenosed - discrete focal lesion, not flow limiting. No evidence of stent.   SUMMARY Diffuse mild to moderate multivessel disease but no obvious culprit lesion to explain significant reduced EF. Unable to visualize any previously placed stents. Moderately elevated LVEDP     RECOMMENDATIONS Continue to titrate GDMT for nonischemic CM. ->  Anticipate titrating up carvedilol to 12.5 mg twice daily and further titration of Entresto. May also consider spironolactone  Cardiac MRI Stress 08/19/20 IMPRESSION: 1. Basal to mid inferior stress perfusion defect consistent with ischemia   2. Moderate LV dilatation with moderate systolic dysfunction (EF 41%)   3. Asymmetric LV hypertrophy measuring up to 78m in basal septum (117min posterior wall). Differential includes hypertrophic cardiomyopathy vs hypertensive heart disease given history of severe uncontrolled hypertension. No evidence of cardiac amyloidosis. Patchy midwall LGE, which can be seen in either HCM or hypertensive heart disease. Suspect component of hypertensive heart disease, but also with likely HCM given extent of hypertrophy and significant amount of LGE, accounting for 12% of total myocardial mass   4.  Normal RV size with mild systolic dysfunction (EF 4232%  5.  Moderate aortic regurgitation (regurgitant fraction 27%)   6.  Mild to moderate mitral regurgitation (regurgitant fraction 21%)   7.  Ascending aortic aneurysm measuring 4573m TTE 05/23/20: IMPRESSIONS   1. Left ventricular ejection fraction, by estimation, is 25%. The left  ventricle has severely  decreased function. The left ventricle demonstrates  global hypokinesis. The left ventricular internal cavity size was mildly  dilated. There is severe left  ventricular hypertrophy raising concern cardiac amyloidosis versus  long-standing severe hypertension. Left ventricular diastolic parameters  are consistent with Grade II diastolic dysfunction (pseudonormalization).   2. Right ventricular systolic function is mildly reduced. The right  ventricular size is normal.   3. Left atrial size was severely dilated.   4. Right atrial size was moderately dilated.   5. The mitral valve is normal in structure. Mild to moderate mitral valve  regurgitation. No evidence of mitral stenosis.   6. The aortic valve is tricuspid. Aortic valve regurgitation is trivial.  No aortic stenosis is present.   7. Aortic dilatation noted. There is severe dilatation of the ascending  aorta, measuring 50 mm.   8. The inferior vena cava is normal in  size with <50% respiratory  variability, suggesting right atrial pressure of 8 mmHg.   9. A small pericardial effusion is present.   CTA 05/22/20: IMPRESSION: 1. No evidence of acute pulmonary embolism. 2. Multifocal patchy airspace opacity within the right lung most pronounced within the right upper lobe. Findings are concerning for multifocal pneumonia. Follow-up to resolution is recommended. 3. 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 Recommend semi-annual imaging followup by CTA or MRA and referral to cardiothoracic surgery if not already obtained. This recommendation follows 2010 ACCF/AHA/AATS/ACR/ASA/SCA/SCAI/SIR/STS/SVM Guidelines for the Diagnosis and Management of Patients With Thoracic Aortic Disease. Circulation. 2010; 121: J009-F818. Aortic aneurysm NOS (ICD10-I71.9) 4. Marked four-chamber cardiomegaly with marked left ventricular hypertrophy. 5. Dilated main pulmonary trunk measuring 4.1 cm in diameter, suggesting  pulmonary arterial hypertension. 6. Mildly enlarged mediastinal and right hilar lymph nodes, likely reactive.   EKG:   EKG is personally reviewed. 04/14/2021: EKG was not ordered. 08/19/2020 (Dr. Gardiner Rhyme): NSR, LVH with repolarization. 07/22/2020: EKG was not ordered. 09/18/21: Sinus bradycardia, LVH with strain, HR 53  Recent Labs: 09/06/2021: ALT 30 10/23/2021: BUN 24; Creatinine, Ser 1.35; Hemoglobin 14.2; Platelets 252; Potassium 4.6; Sodium 137   Recent Lipid Panel    Component Value Date/Time   CHOL 119 12/16/2020 0846   TRIG 154 (H) 12/16/2020 0846   HDL 28 (L) 12/16/2020 0846   CHOLHDL 4.3 12/16/2020 0846   CHOLHDL 5.5 05/23/2020 0239   VLDL 16 05/23/2020 0239   LDLCALC 64 12/16/2020 0846     Risk Assessment/Calculations:       Physical Exam:    VS:  There were no vitals taken for this visit.    Wt Readings from Last 3 Encounters:  11/21/21 225 lb 1.4 oz (102.1 kg)  11/01/21 220 lb (99.8 kg)  10/23/21 220 lb (99.8 kg)     GEN: Comfortable, NAD HEENT: Normal NECK: No JVD; No carotid bruits CARDIAC: RRR, no murmurs RESPIRATORY:  Clear to auscultation without rales, wheezing or rhonchi  ABDOMEN: Soft, non-tender, non-distended MUSCULOSKELETAL:  No edema; No deformity  SKIN: Warm and dry NEUROLOGIC:  Alert and oriented x 3 PSYCHIATRIC:  Normal affect   ASSESSMENT:    No diagnosis found.   PLAN:    In order of problems listed above:  #Chronic Systolic Heart Failure with Improved EF: TTE 04/2020 with LVEF 25%, severe LVH, G2DD, mild RV dysfunction, severe LAE, moderate RAE, mild-to-moderate MR, trivial AI, severe aortic dilation, small pericaridal effusion. Cardiac stress MRI 08/19/20 with basal to mid inferior stress perfusion defect consistent with ischemia, EF 34%. Findings consistent with likely both HTN and HCM. LHC 08/31/2020 with 60% distal RCA and no other obstructive disease to suggest ischemic drop in EF. Suspect HF secondary to HTN. Now on GDMT.  Repeat TTE 02/2021 with improved EF 60-65%, G2DD, severe LVH, normal RV, mild MR. -TTE 02/2021 with improved EF 25>>60-65% -Continue entresto 97/'103mg'$  BID -Continue coreg 37.'5mg'$  BID -Continue farxiga '10mg'$  daily -Continue spironolactone '25mg'$  daily -Continue hydralazine to '25mg'$  TID -Continue imdur '15mg'$  qHS -Low Na diet  #HCM: #Hypertensive heart disease: CMR with severe hypertrophy that was most prominent in the basal septum that measured up to 34m. Patchy LGE noted accounting for 12% of myocardial mass. Likely mixed HCM and hypertensive heart disease. Cardiac monitor with 3 runs of NSVT with longest lasting 8 beats. Rare SVE and rare VE. Seen by Dr. KCaryl Comesand is now s/p ICD placement for primary prevention. -S/p ICD placement for primary prevention -Continue entresto  97/'103mg'$  BID -Continue coreg 37.'5mg'$  BID -Continue spironolactone '25mg'$  daily -Continue hydralazine '25mg'$  TID -Continue imdur '15mg'$  qHS  #Coronary Artery Disease: LHC 08/31/2020 wwith 60% distal RCA, however, may be more significant than previously thought with evidence of ischemia on CMR. Given recurrent admissions for chest pain, he subsequently underwent repeat cath with PCI to distal RCA on 08/2021. -Continue ASA '81mg'$  daily -Continue plavix '75mg'$  daily -Continue liptor '40mg'$  daily -Continue coreg 37.'5mg'$  BID  #Severe ascending aortic dilation: 4.7 cm by CTA 05/22/2020.  Ascending aorta 45 mm by cardiac stress MRI. No evidence of dissection on CTA chest in 08/2021. -Follows with Dr. Orvan Seen of cardiothoracic surgery.  #HLD -Continue liptor '40mg'$  daily -LDL 64 11/2020  #Moderate AI: #Mild to moderate MR: Moderate AI on CMR and appeared mild on TTE 02/2021. Mild MR noted on TTE 02/2021. -Serial TTE monitoring; next in 1-2 years  #Tobacco use: -Smoking cessation counseling   Follow-up in 6 months.  Medication Adjustments/Labs and Tests Ordered: Current medicines are reviewed at length with the patient today.  Concerns  regarding medicines are outlined above.   No orders of the defined types were placed in this encounter.  No orders of the defined types were placed in this encounter.  There are no Patient Instructions on file for this visit.    Signed, Freada Bergeron, MD  12/18/2021 9:24 AM    [place]

## 2021-12-18 ENCOUNTER — Ambulatory Visit (HOSPITAL_COMMUNITY): Payer: Commercial Managed Care - HMO

## 2021-12-19 ENCOUNTER — Encounter: Payer: Self-pay | Admitting: Nurse Practitioner

## 2021-12-19 ENCOUNTER — Encounter: Payer: Commercial Managed Care - HMO | Attending: Cardiology | Admitting: Cardiology

## 2021-12-19 ENCOUNTER — Encounter: Payer: Self-pay | Admitting: Cardiology

## 2021-12-19 VITALS — BP 136/86 | HR 59 | Ht 69.0 in | Wt 221.2 lb

## 2021-12-19 DIAGNOSIS — I351 Nonrheumatic aortic (valve) insufficiency: Secondary | ICD-10-CM | POA: Insufficient documentation

## 2021-12-19 DIAGNOSIS — K921 Melena: Secondary | ICD-10-CM | POA: Insufficient documentation

## 2021-12-19 DIAGNOSIS — I5042 Chronic combined systolic (congestive) and diastolic (congestive) heart failure: Secondary | ICD-10-CM | POA: Diagnosis not present

## 2021-12-19 DIAGNOSIS — I428 Other cardiomyopathies: Secondary | ICD-10-CM | POA: Insufficient documentation

## 2021-12-19 DIAGNOSIS — I2510 Atherosclerotic heart disease of native coronary artery without angina pectoris: Secondary | ICD-10-CM | POA: Insufficient documentation

## 2021-12-19 DIAGNOSIS — I422 Other hypertrophic cardiomyopathy: Secondary | ICD-10-CM | POA: Diagnosis not present

## 2021-12-19 DIAGNOSIS — Z95810 Presence of automatic (implantable) cardiac defibrillator: Secondary | ICD-10-CM | POA: Insufficient documentation

## 2021-12-19 DIAGNOSIS — I10 Essential (primary) hypertension: Secondary | ICD-10-CM | POA: Insufficient documentation

## 2021-12-19 DIAGNOSIS — I340 Nonrheumatic mitral (valve) insufficiency: Secondary | ICD-10-CM | POA: Insufficient documentation

## 2021-12-19 DIAGNOSIS — I7121 Aneurysm of the ascending aorta, without rupture: Secondary | ICD-10-CM | POA: Insufficient documentation

## 2021-12-19 DIAGNOSIS — R519 Headache, unspecified: Secondary | ICD-10-CM | POA: Insufficient documentation

## 2021-12-19 MED ORDER — ATORVASTATIN CALCIUM 40 MG PO TABS: 40.0000 mg | ORAL_TABLET | Freq: Every evening | ORAL | 3 refills | Status: DC

## 2021-12-19 MED ORDER — ENTRESTO 97-103 MG PO TABS: 1.0000 | ORAL_TABLET | Freq: Two times a day (BID) | ORAL | 3 refills | Status: DC

## 2021-12-19 MED ORDER — PANTOPRAZOLE SODIUM 40 MG PO TBEC: 40.0000 mg | DELAYED_RELEASE_TABLET | Freq: Every day | ORAL | 11 refills | Status: DC

## 2021-12-19 NOTE — Patient Instructions (Signed)
Medication Instructions:   START TAKING PANTOPRAZOLE 40 MG BY MOUTH DAILY  *If you need a refill on your cardiac medications before your next appointment, please call your pharmacy*   You have been referred to GASTRO[facility]OLOGY-[facility]--FOR BLACK TARRY STOOLS--THIS IS AN URGENT REFERRAL   You have been referred to [place]    Lab Work:  [employer] [facility] DIFF AND LIPIDS  If you have labs (blood work) drawn today and your tests are completely normal, you will receive your results only by: MyChart Message (if you have MyChart) OR A paper copy in the mail If you have any lab test that is abnormal or we need to change your treatment, we will call you to review the results.    Follow-Up:  3 MONTHS WITH DR. Johney Frame IN THE OFFICE    Important Information About Sugar

## 2021-12-19 NOTE — Progress Notes (Signed)
Cardiology Office Note:    Date:  12/19/2021   ID:  Thomas Spears, DOB 06-Mar-1975, MRN [id number]  PCP:  Ezequiel Essex, MD   [facility] Providers Cardiologist:  Freada Bergeron, MD {    Referring MD: Ezequiel Essex, MD    History of Present Illness:    Thomas Spears is a 46 y.o. male with a hx of systolic heart failure with LVEF 25% 04/2020 that improved to 60-65% in 02/2021, HCM on CMR on 07/2020, HTN, mild-to-moderate MR, severe aortic dilation measuring 73m who presents to clinic for follow-up.  [facility] hypertensive crisis where he presented with dyspnea and HA found to have Bps 220s/150s. CTA chest showed no PE but demonstrated thoracic aortic aneurysm 4.7cm as well as evidence of pulmonary HTN. He was placed on a nitro gtt. Trop 400>433. TTE 05/23/20 showed LVEF 25% with global hypokinesis, severe LVH, G2DD, mild RV dysfunction, severe LAE, moderate RAE, mild-to-moderate MR, trivial AI, severe aortic dilation, small pericardial effusion. Prior to completed work-up, the patient left AMA.   He was seen in clinic 07/23/2018 where he reported several years of not taking his medication and uncontrolled hypertension.  Also regularly drinking energy drinks, soda, smoking 2 packs/day.  He used to be a heavy drinker.  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  He was taking all of his blood pressure medications and feeling overall well.  He displayed NYHA I-II symptoms.  He was recommended for stress MRI to assess for ischemia and evidence of amyloidosis. His [facility]rol[facility] transition to carvedilol.  He was referred to the hypertension clinic.   Seen by pharmacy team 08/09/20 and Carvedilol increased to 12.'5mg'$  QD.    He had cardiac stress MRI 08/19/20 showing basal to mid inferior stress perfusion defect consistent with ischemia.  He had moderate LV dilation with moderate systolic  dysfunction LVEF 34%.  Asymmetric LV hypertrophy measuring up to 39 mL basal septum differential including hypertrophic cardiomyopathy versus hypertensive heart disease.  No evidence of cardiac amyloidosis.  Normal RV size with mild systolic dysfunction, moderate aortic regurgitation, mild to moderate mitral regurgitation, ascending aortic aneurysm measuring 45 mm.Cardiac catheterization performed 7/07-2020 by Dr. HEllyn Hackshowing diffuse mild to moderate multivessel disease, no culprit lesion to explain reduced LVEF, no previously placed stent.   Seen by pharmacy team 09/06/20 and transitioned from indapamide to spironolactone. Seen 09/14/20 with EDelene Lollincreased to 49-'51mg'$  BID. 09/28/20 pharmacy increased Carvedilol to '25mg'$  BID and Spironolactone to '25mg'$  QD, Entresto increased to 97/'103mg'$ .    Seen by pharmacy team 12/02/20. He was continuing to decrease smoking. [facility]. His Carvedilol was increased to 37.'5mg'$  QD as diastolic blood pressure still above goal. Transition from spironolactone to eplenerone was considered but deferred as he was without insurance.   Saw C24.$[id number]$11/2020 where he was doing well. BP 130-140s at that time. Echo on 03/20/21 showed improved LVEF to 60-65%.  Seen in clinic on 03/2021 where he was having atypical chest pain when laying flat at night but was otherwise doing well.   He has had 2 ER visits over the past month for chest pain. Trop 30-40s. He underwent CTA dissection study that showed possible small dissection flap above the coronary cusp. He was recommended for a gated CT chest that showed linear defect along the aortic root but this is unchanged and possible PE in the RUL,  but d-dimer was normal with greater suspicion that this was related to a pulmonary nodule. Dr. Roxan Hockey was consulted who recommended repeat CTA which was negative for any dissection or PE. He was discharged home.  Was last seen in  clinic by Dr. Caryl Comes on 11/01/21 for consideration of ICD which he underwent on 11/01/21.  Today, he reports that he is not feeling well. He has been having headaches with pain behind his eyes. He has not checked his blood pressure while he is experiencing these headaches, but states when he does take his BP, he is consistently in the 120/80s. These HA do not occur with the imdur as he has been on a low dose of this medication for a long time and the HA started about 2 weeks ago. Notably, he has a history of chronic HA and is interested in seeing Neurology.   His heart has been racing but he has not experienced a shock from his ICD. He is having some episodes of lightheadedness right after he lies down, which has been ongoing but resolves within 1 minute. No syncope.   Since his placement procedure he has been having pain at the incision site. No exertional chest pain.  He also notes he has been having black stool that began about 1 week ago. Notably is on DAPT for recent PCI. Not on iron supplementation.  He has not had any alcohol in 2 months. He has tried to improve his diet and usually eats a healthy small snack about 5 times a day. For breakfast he will usually have a banana with applesauce  or something similar.     Past Medical History:  Diagnosis Date   Aortic regurgitation    Ascending aorta dilation (HCC)    CAD (coronary artery disease), native coronary artery 09/20/2021   Chronic combined systolic and diastolic CHF (congestive heart failure) ([place])    Community acquired pneumonia    HLD (hyperlipidemia)    Hypertension    Hypertensive crisis 05/22/2020   Hypertrophic cardiomyopathy ([place])    Left ventricular hypertrophy    Mitral regurgitation    Mobitz type 1 second degree AV block    NSVT (nonsustained ventricular tachycardia) (HCC)    Premature atrial contractions    PVC's (premature ventricular contractions)    Tension headache 06/21/2020   Tobacco use    Tobacco use  09/20/2021    Past Surgical History:  Procedure Laterality Date   CARDIAC CATHETERIZATION     CAROTID STENT INSERTION     CORONARY STENT INTERVENTION N/A 09/20/2021   Procedure: CORONARY STENT INTERVENTION;  Surgeon: [name], MD;  Location: [place] [facility];  Service: Cardiovascular;  Laterality: N/A;   ICD IMPLANT N/A 11/01/2021   Procedure: ICD IMPLANT;  Surgeon: Deboraha Sprang, MD;  Location: [place] [facility];  Service: Cardiovascular;  Laterality: N/A;   KNEE SURGERY Left    LEFT HEART CATH AND CORONARY ANGIOGRAPHY N/A 08/31/2020   Procedure: LEFT HEART CATH AND CORONARY ANGIOGRAPHY;  Surgeon: Leonie Man, MD;  Location: [place] [facility];  Service: Cardiovascular;  Laterality: N/A;   LEFT HEART CATH AND CORONARY ANGIOGRAPHY N/A 09/20/2021   Procedure: LEFT HEART CATH AND CORONARY ANGIOGRAPHY;  Surgeon: [name], MD;  Location: [place] [facility];  Service: Cardiovascular;  Laterality: N/A;   TONSILLECTOMY      Current Medications: No outpatient medications have been marked as taking for the 12/19/21 encounter (Appointment) with Freada Bergeron, MD.     Allergies:  Mushroom extract complex   Social History   Socioeconomic History   Marital status: Single    Spouse name: Not on file   Number of children: 2   Years of education: 8   Highest education level: 8th grade  Occupational History   Not on file  Tobacco Use   Smoking status: Every Day    Packs/day: 0.50    Years: 37.00    Total pack years: 18.50    Types: Cigarettes   Smokeless tobacco: Never   Tobacco comments:    Patient given 1-800-quit-now.  Agreed to having referral faxed for smoking cessation  Substance and Sexual Activity   Alcohol use: Not Currently   Drug use: Never   Sexual activity: Yes  Other Topics Concern   Not on file  Social History Narrative   Not on file   Social Determinants of Health   Financial Resource Strain: Not on file  Food Insecurity: Not on  file  Transportation Needs: Not on file  Physical Activity: Not on file  Stress: Not on file  Social Connections: Not on file     Family History: The patient's family history includes CAD in his father.  ROS:   Please see the history of present illness.    Review of Systems  Constitutional:  Negative for fever and weight loss.  HENT:  Negative for nosebleeds.   Eyes:  Positive for pain. Negative for photophobia.  Respiratory:  Negative for cough, hemoptysis and wheezing.   Cardiovascular:  Positive for palpitations. Negative for chest pain, orthopnea, claudication, leg swelling and PND.  Gastrointestinal:  Positive for blood in stool ((black tarry)). Negative for abdominal pain and vomiting.  Genitourinary:  Negative for frequency and urgency.  Musculoskeletal:  Negative for falls.  Neurological:  Positive for dizziness and headaches. Negative for tingling and seizures.     EKGs/Labs/Other Studies Reviewed:    The following studies were reviewed today:  CTA chest 08/2021: FINDINGS: Cardiovascular: There is preferential opacification of the thoracic aorta. The aortic valve is trileaflet. The thoracic aorta is normal in course and caliber without evidence of intramural hematoma or dissection. The thoracic aorta is mildly dilated measuring 5.7 by 5.7 cm at the sinuses of Valsalva (image # 81/14 and 89/9). The ascending aorta is mildly dilated measuring 4.1 x 4.2 cm (image # 81/14 and 107/15). The descending thoracic aorta is mildly dilated measuring 3.2 x 3.0 cm just beyond the takeoff of the left subclavian artery (image # 123/14 and 132/15). No significant atherosclerotic plaque identified. The arch vasculature is widely patent proximally and demonstrates normal anatomic configuration.   Global cardiac size is within normal limits, however, there is left ventricular hypertrophy again noted with asymmetric septal hypertrophy noted. This results in marked narrowing of the  left ventricular outflow tract with a cross-sectional area of the a outflow tract approximately 7 mm x 25 mm best seen on coronal reformat # 87 and sagittal reformat # 123. This was better assessed on MRI examination of 08/19/2020. Moderate coronary artery calcification. No pericardial effusion. Central pulmonary arteries are of normal caliber. There is adequate opacification of the pulmonary arterial tree and no intraluminal filling defect is identified through the segmental level to suggest acute pulmonary embolism.   Mediastinum/Nodes: No enlarged mediastinal, hilar, or axillary lymph nodes. Thyroid gland, trachea, and esophagus demonstrate no significant findings.   Lungs/Pleura: Lungs are clear. No pleural effusion or pneumothorax.   Upper Abdomen: No acute abnormality.   Musculoskeletal: No chest wall abnormality.  No acute or significant osseous findings.   Review of the MIP images confirms the above findings.   IMPRESSION: 1. No pulmonary embolism. 2. Mild aneurysmal dilation of the thoracic aorta at the sinuses of Valsalva, ascending aorta, and descending thoracic aorta. Maximal diameter 5.7 cm at the sinuses of Valsalva. No evidence of intramural hematoma or dissection. 3. 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 This was better assessed on MRI examination of 08/19/2020. 4. Moderate coronary artery calcification. TTE 03/20/21: IMPRESSIONS     1. No obstructive gradient. Left ventricular ejection fraction, by  estimation, is 60 to 65%. The left ventricle has normal function. The left  ventricle has no regional wall motion abnormalities. There is severe  concentric left ventricular hypertrophy.  Left ventricular diastolic parameters are consistent with Grade II  diastolic dysfunction (pseudonormalization).   2. Right ventricular systolic function is normal. The right  ventricular  size is normal.   3. Left atrial size was moderately dilated.   4. The mitral valve is normal in structure. Mild mitral valve  regurgitation. No evidence of mitral stenosis.   5. The aortic valve is normal in structure. Aortic valve regurgitation is  mild. No aortic stenosis is present.   6. Aortic dilatation noted. There is moderate dilatation of the aortic  root, measuring 48 mm. There is moderate dilatation of the ascending  aorta, measuring 45 mm.   7. The inferior vena cava is normal in size with greater than 50%  respiratory variability, suggesting right atrial pressure of 3 mmHg.   Comparison(s): The left ventricular function has improved. EF 25%, severe  LVH, AOV 13m, small pericardial effusion.   Conclusion(s)/Recommendation(s): Findings consistent with hypertrophic  cardiomyopathy. Prior Cardiac MRI has been performed.   Echo 03/20/2021:  1. No obstructive gradient. Left ventricular ejection fraction, by  estimation, is 60 to 65%. The left ventricle has normal function. The left  ventricle has no regional wall motion abnormalities. There is severe  concentric left ventricular hypertrophy.  Left ventricular diastolic parameters are consistent with Grade II  diastolic dysfunction (pseudonormalization).   2. Right ventricular systolic function is normal. The right ventricular  size is normal.   3. Left atrial size was moderately dilated.   4. The mitral valve is normal in structure. Mild mitral valve  regurgitation. No evidence of mitral stenosis.   5. The aortic valve is normal in structure. Aortic valve regurgitation is  mild. No aortic stenosis is present.   6. Aortic dilatation noted. There is moderate dilatation of the aortic  root, measuring 48 mm. There is moderate dilatation of the ascending  aorta, measuring 45 mm.   7. The inferior vena cava is normal in size with greater than 50%  respiratory variability, suggesting right atrial pressure of 3 mmHg.    Comparison(s): The left ventricular function has improved. EF 25%, severe  LVH, AOV 566m small pericardial effusion.   Conclusion(s)/Recommendation(s): Findings consistent with hypertrophic  cardiomyopathy. Prior Cardiac MRI has been performed.   CT Chest 12/21/2020: COMPARISON:  CT angiography of the chest of March of 2022.   FINDINGS: Cardiovascular: Ascending aorta of 4.7 cm in transverse dimension stable compared to previous imaging. Descending thoracic aorta 3.4 cm also unchanged. Coronal dimension of the ascending thoracic aorta approximately 4.3 cm stable when measured in a similar fashion as well on the previous imaging study. The heart size remains enlarged. Coronary artery calcification of LEFT coronary circulation similar to the  prior study. No substantial pericardial effusion. Normal caliber of central pulmonary vessels. Limited assessment of cardiovascular structures given lack of intravenous contrast.   Mediastinum/Nodes: No thoracic inlet, axillary, mediastinal or hilar adenopathy. Esophagus grossly normal.   Lungs/Pleura: Lungs are clear. Airways are patent. Mild paraseptal emphysema at the lung apices.   Upper Abdomen: No acute abnormality.   Musculoskeletal: No chest wall mass or suspicious bone lesions identified.   IMPRESSION: Stable aneurysmal dilation of the ascending thoracic aorta measuring up to 4.7 cm in transverse dimension. 4.3 cm in greatest coronal dimension unchanged from previous imaging. Ascending thoracic aortic aneurysm. Recommend semi-annual imaging followup by CTA or MRA and referral to cardiothoracic surgery if not already obtained. This recommendation follows 2010 ACCF/AHA/AATS/ACR/ASA/SCA/SCAI/SIR/STS/SVM Guidelines for the Diagnosis and Management of Patients With Thoracic Aortic Disease. Circulation. 2010; 121: O756-E332. Aortic aneurysm NOS (ICD10-I71.9)   Stable cardiomegaly and coronary artery calcification.   Mild  paraseptal emphysema at the lung apices.   Aortic Atherosclerosis (ICD10-I70.0) and Emphysema (ICD10-J43.9).  Monitor 09/20/2020: Patch wear time was 8 days and 12 hours. Predominant rhythm was NSR with average HR 79bpm (ranging from 34-169bpm). There were 3 runs of nonsustained VT with longest/fastest lasting 8 beats at a rate of 169bpm Rare SVE (<1%), rare VE (<1%) Mobitz type I block seen during sleep Patient triggered events correlated with NSR No Afib, sustained arrhythmias or significant pauses     Patch Wear Time:  8 days and 12 hours (2022-07-11T21:01:27-0400 to 2022-07-20T09:54:00-0400)   Patient had a min HR of 34 bpm, max HR of 169 bpm, and avg HR of 79 bpm. Predominant underlying rhythm was Sinus Rhythm. First Degree AV Block was present. 3 Ventricular Tachycardia runs occurred, the run with the fastest interval lasting 8 beats with a  max rate of 169 bpm (avg 130 bpm); the run with the fastest interval was also the longest. Second Degree AV Block-Mobitz I (Wenckebach) was present. Isolated SVEs were rare (<1.0%), and no SVE Couplets or SVE Triplets were present. Isolated VEs were rare  (<1.0%, 170), VE Couplets were rare (<1.0%, 12), and VE Triplets were rare (<1.0%, 6). Ventricular Trigeminy was present.   LHC 10/31/16 LV end diastolic pressure is moderately elevated. There is no aortic valve stenosis. Mid LM to Dist LM lesion is 20% stenosed. Ost LAD to Prox LAD lesion is 30% stenosed. Mid LAD lesion is 45% stenosed @ 1st Diag (40% pre & 50% post 1st Diag) Prox RCA lesion is 30% stenosed. Dist RCA lesion is 60% stenosed - discrete focal lesion, not flow limiting. No evidence of stent.   SUMMARY Diffuse mild to moderate multivessel disease but no obvious culprit lesion to explain significant reduced EF. Unable to visualize any previously placed stents. Moderately elevated LVEDP     RECOMMENDATIONS Continue to titrate GDMT for nonischemic CM. ->  Anticipate titrating  up carvedilol to 12.5 mg twice daily and further titration of Entresto. May also consider spironolactone  Cardiac MRI Stress 08/19/20 IMPRESSION: 1. Basal to mid inferior stress perfusion defect consistent with ischemia   2. Moderate LV dilatation with moderate systolic dysfunction (EF 84%)   3. Asymmetric LV hypertrophy measuring up to 78m in basal septum (159min posterior wall). Differential includes hypertrophic cardiomyopathy vs hypertensive heart disease given history of severe uncontrolled hypertension. No evidence of cardiac amyloidosis. Patchy midwall LGE, which can be seen in either HCM or hypertensive heart disease. Suspect component of hypertensive heart disease, but also with likely HCM given extent of hypertrophy and significant  amount of LGE, accounting for 12% of total myocardial mass   4.  Normal RV size with mild systolic dysfunction (EF 27%)   5.  Moderate aortic regurgitation (regurgitant fraction 27%)   6.  Mild to moderate mitral regurgitation (regurgitant fraction 21%)   7.  Ascending aortic aneurysm measuring 38m   TTE 05/23/20: IMPRESSIONS   1. Left ventricular ejection fraction, by estimation, is 25%. The left  ventricle has severely decreased function. The left ventricle demonstrates  global hypokinesis. The left ventricular internal cavity size was mildly  dilated. There is severe left  ventricular hypertrophy raising concern cardiac amyloidosis versus  long-standing severe hypertension. Left ventricular diastolic parameters  are consistent with Grade II diastolic dysfunction (pseudonormalization).   2. Right ventricular systolic function is mildly reduced. The right  ventricular size is normal.   3. Left atrial size was severely dilated.   4. Right atrial size was moderately dilated.   5. The mitral valve is normal in structure. Mild to moderate mitral valve  regurgitation. No evidence of mitral stenosis.   6. The aortic valve is tricuspid.  Aortic valve regurgitation is trivial.  No aortic stenosis is present.   7. Aortic dilatation noted. There is severe dilatation of the ascending  aorta, measuring 50 mm.   8. The inferior vena cava is normal in size with <50% respiratory  variability, suggesting right atrial pressure of 8 mmHg.   9. A small pericardial effusion is present.   CTA 05/22/20: IMPRESSION: 1. No evidence of acute pulmonary embolism. 2. Multifocal patchy airspace opacity within the right lung most pronounced within the right upper lobe. Findings are concerning for multifocal pneumonia. Follow-up to resolution is recommended. 3. 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 Recommend semi-annual imaging followup by CTA or MRA and referral to cardiothoracic surgery if not already obtained. This recommendation follows 2010 ACCF/AHA/AATS/ACR/ASA/SCA/SCAI/SIR/STS/SVM Guidelines for the Diagnosis and Management of Patients With Thoracic Aortic Disease. Circulation. 2010; 121:: [id number] Aortic aneurysm NOS (ICD10-I71.9) 4. Marked four-chamber cardiomegaly with marked left ventricular hypertrophy. 5. Dilated main pulmonary trunk measuring 4.1 cm in diameter, suggesting pulmonary arterial hypertension. 6. Mildly enlarged mediastinal and right hilar lymph nodes, likely reactive.   EKG:   EKG is personally reviewed. 12/18/2021: Sinus bradycardia. LVH, HR 59. 04/14/2021: EKG was not ordered. 08/19/2020 (Dr. SGardiner Rhyme: NSR, LVH with repolarization. 07/22/2020: EKG was not ordered. 09/18/21: Sinus bradycardia, LVH with strain, HR 53  Recent Labs: 09/06/2021: ALT 30 10/23/2021: BUN 24; Creatinine, Ser 1.35; Hemoglobin 14.2; Platelets 252; Potassium 4.6; Sodium 137   Recent Lipid Panel    Component Value Date/Time   CHOL 119 12/16/2020 0846   TRIG 154 (H) 12/16/2020 0846   HDL 28 (L) 12/16/2020 0846   CHOLHDL 4.3 12/16/2020 0846   CHOLHDL 5.5 05/23/2020 0239   VLDL 16 05/23/2020 0239    LDLCALC 64 12/16/2020 0846     Risk Assessment/Calculations:       Physical Exam:    VS:  There were no vitals taken for this visit.    Wt Readings from Last 3 Encounters:  11/21/21 225 lb 1.4 oz (102.1 kg)  11/01/21 220 lb (99.8 kg)  10/23/21 220 lb (99.8 kg)     GEN: Comfortable, NAD HEENT: Normal NECK: No JVD; No carotid bruits CARDIAC: RRR, no murmurs RESPIRATORY:  Clear to auscultation without rales, wheezing or rhonchi  ABDOMEN: Soft, non-tender, non-distended MUSCULOSKELETAL:  No edema; No deformity  SKIN: Warm and dry NEUROLOGIC:  Alert and oriented  x 3 PSYCHIATRIC:  Normal affect   ASSESSMENT:    No diagnosis found.   PLAN:    In order of problems listed above:  #Chronic Systolic Heart Failure with Improved EF: TTE 04/2020 with LVEF 25%, severe LVH, G2DD, mild RV dysfunction, severe LAE, moderate RAE, mild-to-moderate MR, trivial AI, severe aortic dilation, small pericaridal effusion. Cardiac stress MRI 08/19/20 with basal to mid inferior stress perfusion defect consistent with ischemia, EF 34%. Findings consistent with likely both HTN and HCM. LHC 08/31/2020 with 60% distal RCA and no other obstructive disease to suggest ischemic drop in EF. Suspect HF secondary to HTN. Now on GDMT. Repeat TTE 02/2021 with improved EF 60-65%, G2DD, severe LVH, normal RV, mild MR. Currently euvolemic on exam with NYHA class II symptoms. -TTE 02/2021 with improved EF 25>>60-65% -Continue entresto 97/'103mg'$  BID -Continue coreg 37.'5mg'$  BID -Continue farxiga '10mg'$  daily -Continue spironolactone '25mg'$  daily -Continue hydralazine '25mg'$  TID -Continue imdur '15mg'$  qHS -Low Na diet   #HCM: #Hypertensive heart disease: CMR with severe hypertrophy that was most prominent in the basal septum that measured up to 29m. Patchy LGE noted accounting for 12% of myocardial mass. Likely mixed HCM and hypertensive heart disease. Cardiac monitor with 3 runs of NSVT with longest lasting 8 beats. Rare SVE  and rare VE. Seen by Dr. KCaryl Comesand is now s/p ICD placement for primary prevention. -S/p ICD placement for primary prevention -Follow-up with Dr. KCaryl Comesas scheduled -Continue entresto 97/'103mg'$  BID -Continue coreg 37.'5mg'$  BID -Continue spironolactone '25mg'$  daily -Continue hydralazine '25mg'$  TID -Continue imdur '15mg'$  qHS  #Black Stool: Concerning for GIB in the setting of DAPT. Will check CBC today and refer to GI. He merits continued DAPT but could hold temporarily if needed EGD. -Check CBC -Start PPI -Refer to GI   #Coronary Artery Disease: LHC 08/31/2020 wwith 60% distal RCA, however, may be more significant than previously thought with evidence of ischemia on CMR. Given recurrent admissions for chest pain, he subsequently underwent repeat cath with PCI to distal RCA on 08/2021. -Continue ASA '81mg'$  daily -Continue plavix '75mg'$  daily -Continue liptor '40mg'$  daily -Continue coreg 37.'5mg'$  BID -Will refer to GI given black stool as above -Start PPI as above   #Severe ascending aortic dilation: 4.7 cm by CTA 05/22/2020.  Ascending aorta 45 mm by cardiac stress MRI. No evidence of dissection on CTA chest in 08/2021. -Follows with Dr. AOrvan Seenof cardiothoracic surgery.   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 Mild MR noted on TTE 02/2021. -Serial TTE monitoring; next in 1-2 years   #Tobacco use: -States he is not currently smoking  #Chronic HA: Not related to imdur and does not correlate with elevated BP. Requesting referral to neuro. -Refer to neuro     Follow-up in 3 months  Medication Adjustments/Labs and Tests Ordered: Current medicines are reviewed at length with the patient today.  Concerns regarding medicines are outlined above.   No orders of the defined types were placed in this encounter.  No orders of the defined types were placed in this encounter.  There  are no Patient Instructions on file for this visit.   I,Jessica Ford,acting as a sEducation administratorfor HFreada Bergeron MD.,have documented all relevant documentation on the behalf of HFreada Bergeron MD,as directed by  HFreada Bergeron MD while in the presence of HFreada Bergeron MD.   I, HGreer Ee  Johney Frame, MD, have reviewed all documentation for this visit. The documentation on 12/19/21 for the exam, diagnosis, procedures, and orders are all accurate and complete.    Jerene Pitch  12/19/2021 7:42 AM    [facility]

## 2021-12-20 ENCOUNTER — Ambulatory Visit (HOSPITAL_COMMUNITY): Payer: Commercial Managed Care - HMO

## 2021-12-20 LAB — CBC WITH DIFFERENTIAL/PLATELET
Basophils Absolute: 0.1 10*3/uL (ref 0.0–0.2)
Basos: 1 %
EOS (ABSOLUTE): 0.3 10*3/uL (ref 0.0–0.4)
Eos: 3 %
Hematocrit: 45.2 % (ref 37.5–51.0)
Hemoglobin: 15.2 g/dL (ref 13.0–17.7)
Immature Grans (Abs): 0 10*3/uL (ref 0.0–0.1)
Immature Granulocytes: 0 %
Lymphocytes Absolute: 1.6 10*3/uL (ref 0.7–3.1)
Lymphs: 17 %
MCH: 30 pg (ref 26.6–33.0)
MCHC: 33.6 g/dL (ref 31.5–35.7)
MCV: 89 fL (ref 79–97)
Monocytes Absolute: 0.7 10*3/uL (ref 0.1–0.9)
Monocytes: 7 %
Neutrophils Absolute: 6.9 10*3/uL (ref 1.4–7.0)
Neutrophils: 72 %
Platelets: 229 10*3/uL (ref 150–450)
RBC: 5.06 x10E6/uL (ref 4.14–5.80)
RDW: 12.9 % (ref 11.6–15.4)
WBC: 9.6 10*3/uL (ref 3.4–10.8)

## 2021-12-20 LAB — LIPID PANEL
Chol/HDL Ratio: 3.2 ratio (ref 0.0–5.0)
Cholesterol, Total: 102 mg/dL (ref 100–199)
HDL: 32 mg/dL — ABNORMAL LOW (ref >39)
LDL Chol Calc (NIH): 52 mg/dL (ref 0–99)
Triglycerides: 90 mg/dL (ref 0–149)
VLDL Cholesterol Cal: 18 mg/dL (ref 5–40)

## 2021-12-22 ENCOUNTER — Ambulatory Visit (HOSPITAL_COMMUNITY): Payer: Commercial Managed Care - HMO

## 2021-12-25 ENCOUNTER — Ambulatory Visit (HOSPITAL_COMMUNITY): Payer: Commercial Managed Care - HMO

## 2021-12-27 ENCOUNTER — Ambulatory Visit (HOSPITAL_COMMUNITY): Payer: Commercial Managed Care - HMO

## 2021-12-29 ENCOUNTER — Ambulatory Visit (HOSPITAL_COMMUNITY): Payer: Commercial Managed Care - HMO

## 2022-01-01 ENCOUNTER — Ambulatory Visit (HOSPITAL_COMMUNITY): Payer: Commercial Managed Care - HMO

## 2022-01-03 ENCOUNTER — Ambulatory Visit (HOSPITAL_COMMUNITY): Payer: Commercial Managed Care - HMO

## 2022-01-05 ENCOUNTER — Ambulatory Visit (HOSPITAL_COMMUNITY): Payer: Commercial Managed Care - HMO

## 2022-01-05 ENCOUNTER — Other Ambulatory Visit: Payer: Self-pay

## 2022-01-05 MED ORDER — HYDRALAZINE HCL 25 MG PO TABS: 25.0000 mg | ORAL_TABLET | Freq: Three times a day (TID) | ORAL | 3 refills | Status: DC

## 2022-01-08 ENCOUNTER — Ambulatory Visit (HOSPITAL_COMMUNITY): Payer: Commercial Managed Care - HMO

## 2022-01-09 ENCOUNTER — Encounter: Payer: Commercial Managed Care - HMO | Attending: Cardiology | Admitting: Genetic Counselor

## 2022-01-09 DIAGNOSIS — R519 Headache, unspecified: Secondary | ICD-10-CM | POA: Insufficient documentation

## 2022-01-09 DIAGNOSIS — K921 Melena: Secondary | ICD-10-CM | POA: Insufficient documentation

## 2022-01-09 DIAGNOSIS — I7121 Aneurysm of the ascending aorta, without rupture: Secondary | ICD-10-CM | POA: Insufficient documentation

## 2022-01-09 DIAGNOSIS — I422 Other hypertrophic cardiomyopathy: Secondary | ICD-10-CM | POA: Insufficient documentation

## 2022-01-09 DIAGNOSIS — Z95810 Presence of automatic (implantable) cardiac defibrillator: Secondary | ICD-10-CM | POA: Insufficient documentation

## 2022-01-09 DIAGNOSIS — I2510 Atherosclerotic heart disease of native coronary artery without angina pectoris: Secondary | ICD-10-CM | POA: Insufficient documentation

## 2022-01-09 DIAGNOSIS — I10 Essential (primary) hypertension: Secondary | ICD-10-CM | POA: Insufficient documentation

## 2022-01-09 DIAGNOSIS — I340 Nonrheumatic mitral (valve) insufficiency: Secondary | ICD-10-CM | POA: Insufficient documentation

## 2022-01-09 DIAGNOSIS — I351 Nonrheumatic aortic (valve) insufficiency: Secondary | ICD-10-CM | POA: Insufficient documentation

## 2022-01-09 DIAGNOSIS — I5042 Chronic combined systolic (congestive) and diastolic (congestive) heart failure: Secondary | ICD-10-CM | POA: Insufficient documentation

## 2022-01-09 DIAGNOSIS — I428 Other cardiomyopathies: Secondary | ICD-10-CM | POA: Insufficient documentation

## 2022-01-10 ENCOUNTER — Ambulatory Visit (HOSPITAL_COMMUNITY): Payer: Commercial Managed Care - HMO

## 2022-01-12 ENCOUNTER — Other Ambulatory Visit: Payer: Self-pay | Admitting: *Deleted

## 2022-01-12 ENCOUNTER — Ambulatory Visit (HOSPITAL_COMMUNITY): Payer: Commercial Managed Care - HMO

## 2022-01-15 ENCOUNTER — Ambulatory Visit (HOSPITAL_COMMUNITY): Payer: Commercial Managed Care - HMO

## 2022-01-16 ENCOUNTER — Other Ambulatory Visit: Payer: Self-pay

## 2022-01-16 MED ORDER — CARVEDILOL 25 MG PO TABS: 37.5000 mg | ORAL_TABLET | Freq: Two times a day (BID) | ORAL | 3 refills | Status: DC

## 2022-01-16 NOTE — Progress Notes (Signed)
Pre Test Genetic Consult  Referral Reason  Thomas Spears is referred for genetic consult and testing for the hypertrophic cardiomyopathy subsequent to cardiac imaging studies that detected cardiac wall thickness suggestive of HCM.  Thomas Spears (III.1 on pedigree) is a 46 year old Caucasian male who performs janitorial services at a Nurse, learning disability. He was evaluated by Dr. Johney Frame 2 years ago when he was referred for chest tightness and pains. Imaging studied at that time detected asymmetric basal septal thickness of 3.3 cm with scar burden of 12% and reduced EF of 34%.   Currently he complains of having heart flutters, dizziness and near syncope about 6 months ago when he nearly fell while walking to work.  Traditional Risk Factors Thomas Spears reports being diagnosed with HTN for the last 20 years and tells me that he stopped taking his blood pressure medication for the last 5-6 years.  Family history  Relation to Proband Pedigree # Current age Heart condition/age of onset Notes  Daughter IV.1 21 None Echo/EKG ND, Pregnant-expecting a girl, due date 05/01/22  Son IV.2 2 None         Father II.10 Deceased 4-vessel CABG Died of pneumonia @ 66  Paternal aunts- 2 II.1, II.9 66, Deceased None II.7- Died very young of?  Paternal uncles-7 II.2-II.8 II.2-II.7- Deceased II.8- 59 II.8-CABG 2 died of MVA, 1 FROM A GUNSHOT, OTHERS- UNKNOWN  Paternal grandfather I.1 Deceased ? Died @ ? - cause unknown  Paternal grandmother I.2 Deceased ? Died @ ? - cause unknown        Mother II.12 Deceased None Died of breast cancer @ 74  Maternal Aunts- 3  II.13, II.24-II.25 64, Deceased None II.53- Died of lung cancer II.25- Cause of death unknown  Maternal uncles-9 II.14-II.80 Deceased None Died of old age or poor health  Maternal grandfather I.3 Deceased None Died of MVA @ 85   Maternal grandmother I.4 Deceased None Died @ 74s - cause unknown   Genetics Thomas Spears was  counseled on the genetics of hypertrophic cardiomyopathy (HCM). I explained to the patient that this is an autosomal dominant condition with incomplete penetrance i.e. not all individuals harboring the HCM mutation will present clinically with HCM, and age-related penetrance where clinical presentation of HCM increases with advanced age.   Since HCM is an autosomal dominant condition, first degree-relatives should seek regular surveillance for HCM by EKG and echocardiogram.  Explained to him that frequency is typically determined by age, with those in their teens undergoing screening every year and those over the age of 25 getting screened every 3-5 years. Patient verbalized understanding of this.   I informed the patient that about 8-10% of HCM patients can have compound and digenic mutations for HCM. Also briefly discussed the inheritance pattern and treatment /management plans for the infiltrative cardiomyopathies that present as HCM phenocopies.   We walked through the process of genetic testing. I explained to the patient that genetic testing is a probabilistic test dependent upon age and severity of presentation, presence of risk factors for HCM and importantly family history of HCM or sudden death in first-degree relatives.   The potential outcomes of genetic testing and subsequent management of at-risk family members were discussed to manage expectations-  If a mutation is not identified, then all first-degree relatives should undergo regular screening for HCM. I emphasized that even if the genetic test is negative, it does not mean that he does not have HCM. A negative test result can be due to limitations of  the genetic test. Patient verbalized understanding of this.  There is also the likelihood of identifying a "Variant of unknown significance." This result means that the variant has not been detected in a statistically significant number of HCM patients and/or functional studies have not  been performed to verify its pathogenicity. This VUS can be tested in the family to see if it segregates with disease. If a VUS is found, first-degree relatives should undergo regular clinical screening for HCM.  If a pathogenic variant is reported, then first-degree family members can get tested for this variant. If they test positive, it is likely they will develop HCM. Considering variable expression and incomplete penetrance associated with HCM, it is not possible to predict when they will manifest clinically with HCM. It is recommended that family members that test positive for the familial pathogenic variant pursue clinical screening for HCM. Family members that test negative for the familial mutation need not pursue periodic screening for HCM, but seek care if symptoms develop.  Impression  Thomas Spears was found to have cardiac wall thickness suggestive of HCM at age 36. His early age of presentation and severity of symptoms is indicative of a genetic condition. However, there is no family history of HCM or sudden death making it highly likely that he has a de novo mutation for HCM or that there is reduced penetrance as his parents died relatively young.   Genetic testing for HCM is recommended. This test should include the sarcomeric genes implicated in HCM as well as the genes for the HCM phenocopies, such as Fabry disease, Danon disease, WPW syndrome and familial transthyretin amyloidosis.  In addition, we discussed the protections afforded by the Genetic Information Non-Discrimination Act (GINA). I explained to the patient that GINA protects him from losing his employment or health insurance based on his genotype. However, these protections do not cover life insurance and disability. Patient verbalized understanding of this and informs me that his children have life insurance.   Please note that the patient has not been counseled in this visit on personal, cultural, or ethical issues that he  may face due to his heart condition.   Plan After a thorough discussion of the risk and benefits of genetic testing for HCM, Thomas Spears states his intent to pursue genetic testing for HCM. However, his health insurance does not cover genetic testing. He is agreeable to pursue testing with a lab that has a sponsored program with a potential loss of genetic data privacy.    Lattie Corns, Ph.D, [facility] Clinical Molecular Geneticist

## 2022-01-17 ENCOUNTER — Ambulatory Visit (HOSPITAL_COMMUNITY): Payer: Commercial Managed Care - HMO

## 2022-01-19 ENCOUNTER — Ambulatory Visit (HOSPITAL_COMMUNITY): Payer: Commercial Managed Care - HMO

## 2022-01-22 ENCOUNTER — Ambulatory Visit (HOSPITAL_COMMUNITY): Payer: Commercial Managed Care - HMO

## 2022-01-23 NOTE — Progress Notes (Unsigned)
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   He presents to our office today as referred by cardiologist Dr. PJohney Framefor further evaluation regarding black stools on Plavix and ASA.  He previously passed a normal brown formed stool once daily for the past year he started passing 1 bowel movement 2 to 3 days weekly.  He started passing soft black-colored stools 2 to 3 days weekly for the past 2 months.  Hemoglobin level 15.2 on 12/19/2021.  No Pepto-Bismol or oral iron use.  He saw bright red blood on the toilet tissue and on the stool x 3 over the past year, most recent episode occurred 09/2021 which she attributes to having hemorrhoids. He has heartburn 2 to 3 times weekly x 5 to 6 years. He was started on Pantoprazole 414monce daily by Dr. PeJohney Framend his heartburn has significantly improved.  No dysphagia. No upper abdominal pain. He has lower abdominal pain twice weekly which lasts for 5 to 10 minutes x 4 to 5 months which he thinks is due to activity and lifting heavy objects while at work. He was taking Advil 4 tabs once or twice daily for headaches x 10 years which he stopped within the past year.  No prior history of GI bleed.  He denies ever having an EGD or screening colonoscopy.  No known family history of esophageal, gastric or colon cancer.  He intermittently feels lightheaded but denies having any chest pain, dizziness, palpitations or shortness of breath.  His AICD has never  fired.     Latest Ref Rng & Units 12/19/2021    8:51 AM 10/23/2021   10:02 AM 09/29/2021    9:11 AM  CBC  WBC 3.4 - 10.8 x10E3/uL 9.6  11.4  10.3   Hemoglobin 13.0 - 17.7 g/dL 15.2  14.2  14.3   Hematocrit 37.5 - 51.0 % 45.2  42.0  41.4   Platelets 150 - 450 x10E3/uL 229  252  254        Latest Ref Rng & Units 10/23/2021   10:02 AM 10/09/2021    4:13 PM 09/29/2021    9:11 AM  CMP  Glucose 70 - 99 mg/dL 94  81  92   BUN 6 - 24 mg/dL 24  21  33   Creatinine 0.76 - 1.27 mg/dL 1.35  1.32  1.61   Sodium 134 - 144 mmol/L 137  138  137   Potassium 3.5 - 5.2 mmol/L 4.6  4.4  4.2   Chloride 96 - 106 mmol/L 101  104  102   CO2 20 - 29 mmol/L _0 Calcium 8.7 - 10.2 mg/dL 10.0  9.5  9.6     ECHO 03/20/2021: No obstructive gradient. Left ventricular ejection fraction, by estimation, is 60 to 65%. The left ventricle has normal function. The left ventricle has no regional wall motion abnormalities. There is severe concentric left ventricular hypertrophy. Left ventricular diastolic parameters are consistent with Grade II diastolic dysfunction (pseudonormalization). 1. 2. Right ventricular  systolic function is normal. The right ventricular size is normal. 3. Left atrial size was moderately dilated. The mitral valve is normal in structure. Mild mitral valve regurgitation. No evidence of mitral stenosis. 4. The aortic valve is normal in structure. Aortic valve regurgitation is mild. No aortic stenosis is present. 5. Aortic dilatation noted. There is moderate dilatation of the aortic root, measuring 48 mm. There is moderate dilatation of the ascending aorta, measuring 45 mm. 6. The inferior vena cava is normal in size with greater than 50% respiratory variability, suggesting right atrial pressure of 3 mmHg. 7. Comparison(s): The left ventricular function has improved. EF 25%, severe LVH, AOV 33m, small pericardial effusion. Findings consistent with hypertrophic cardiomyopathy.    Past Medical History:  Diagnosis Date   Aortic regurgitation    Ascending aorta dilation (HCC)    CAD (coronary artery disease), native coronary artery 09/20/2021   Chronic combined systolic and diastolic CHF (congestive heart failure) ([place]    Community acquired pneumonia    HLD (hyperlipidemia)    Hypertension    Hypertensive crisis 05/22/2020   [place]    Left ventricular hypertrophy    Mitral regurgitation    Mobitz type 1 second degree AV block    NSVT (nonsustained ventricular tachycardia) (HCC)    Premature atrial contractions    PVC's (premature ventricular contractions)    Tension headache 06/21/2020   Tobacco use    Tobacco use 09/20/2021   Past Surgical History:  Procedure Laterality Date   CARDIAC CATHETERIZATION     CAROTID STENT INSERTION     CORONARY STENT INTERVENTION N/A 09/20/2021   Procedure: CORONARY STENT INTERVENTION;  Surgeon: CSherren Mocha MD;  [facility];  Service: Cardiovascular;  Laterality: N/A;   ICD IMPLANT N/A 11/01/2021   Procedure: ICD IMPLANT;  Surgeon: KDeboraha Sprang MD;  [facility];  Service: Cardiovascular;  Laterality: N/A;   KNEE SURGERY Left    LEFT HEART CATH AND CORONARY ANGIOGRAPHY N/A 08/31/2020   Procedure: LEFT HEART CATH AND CORONARY ANGIOGRAPHY;  Surgeon: HLeonie Man MD;  [facility];  Service: Cardiovascular;  Laterality: N/A;   LEFT HEART CATH AND CORONARY ANGIOGRAPHY N/A 09/20/2021   Procedure: LEFT HEART CATH AND CORONARY ANGIOGRAPHY;  Surgeon: CSherren Mocha MD;  [facility];  Service: Cardiovascular;  Laterality: N/A;   TONSILLECTOMY    Right arm cyst removal age 89years  Social History: He is married.  He has 1 son and 1 daughter.  He is aArmed forces training and education officer He smoked 1 1/2 to 2 packs of cigarettes daily for 20 to 25 years then quit smoking 3 to 4 months ago and has recently restarted smoking 4 cigarettes daily.  No alcohol x 6 months, he previously drank a few beers "every once in a while".  He denies drug use.  Family History: Father with history of heart disease. Mother died from breast cancer.  No known family history of esophageal, gastric or colon cancer.  Allergies  Allergen Reactions   Mushroom Extract Complex Anaphylaxis      Outpatient Encounter Medications as of 01/24/2022  Medication Sig   acetaminophen (TYLENOL) 500 MG tablet Take 2 tablets (1,000 mg total) by mouth every 6 (six) hours as needed (pain).   aspirin EC 81 MG tablet Take 1 tablet (81 mg total) by mouth every evening. Swallow whole.   atorvastatin (LIPITOR) 40 MG tablet Take 1 tablet (40 mg total) by mouth every  evening.   carvedilol (COREG) 25 MG tablet Take 1.5 tablets (37.5 mg total) by mouth 2 (two) times daily.   clopidogrel (PLAVIX) 75 MG tablet TAKE 1 TABLET(75 MG) BY MOUTH DAILY   dapagliflozin propanediol (FARXIGA) 10 MG TABS tablet Take 1 tablet (10 mg total) by mouth daily.   hydrALAZINE (APRESOLINE) 25 MG tablet Take 1 tablet (25 mg total) by mouth 3 (three) times daily.   isosorbide mononitrate (IMDUR) 30 MG 24 hr tablet Take 0.5 tablets (15 mg total) by mouth daily.   Na Sulfate-K Sulfate-Mg Sulf 17.5-3.13-1.6 GM/177ML SOLN Take 1 kit by mouth once for 1 dose.   pantoprazole (PROTONIX) 40 MG tablet Take 1 tablet (40 mg total) by mouth daily.   sacubitril-valsartan (ENTRESTO) 97-103 MG Take 1 tablet by mouth 2 (two) times daily.   spironolactone (ALDACTONE) 25 MG tablet Take 0.5 tablets (12.5 mg total) by mouth daily.   No facility-administered encounter medications on file as of 01/24/2022.    REVIEW OF SYSTEMS:  Gen: Denies fever, sweats or chills. No weight loss.  CV: Denies chest pain, palpitations or edema. Resp: Denies cough, shortness of breath of hemoptysis.  GI: See HPI.   GU : Denies urinary burning, blood in urine, increased urinary frequency or incontinence. MS: Denies joint pain, muscles  aches or weakness. Derm: Denies rash, itchiness, skin lesions or unhealing ulcers. Psych: Denies depression, anxiety memory loss. Heme: Denies bruising, easy bleeding. Neuro: + Headaches.  Endo:  Denies any problems with DM, thyroid or adrenal function.  PHYSICAL EXAM: BP (!) 146/90   Pulse 80   Ht _0  (1.753 m)   Wt 216 lb 8 oz (98.2 kg)   BMI 31.97 kg/m  General: 46 year old male in no acute distress.   Eyes:  Sclerae non-icteric, conjunctive pink. Ears: Normal auditory acuity. Mouth: Absent dentition.  No ulcers or lesions.  Neck: Supple, no lymphadenopathy or thyromegaly.  Lungs: Clear bilaterally to auscultation without wheezes, crackles or rhonchi. Heart: Regular rate and rhythm. No murmur, rub or gallop appreciated.  Abdomen: Soft, nontender, non distended. No masses. No hepatosplenomegaly. Normoactive bowel sounds x 4 quadrants.  Rectal: Patient declined exam, elects to proceed with colonoscopy for further evaluation. Patient to complete heme cards.  Musculoskeletal: Symmetrical with no gross deformities. Skin: Warm and dry. No rash or lesions on visible extremities. Extremities: No edema. Neurological: Alert oriented x 4, no focal deficits.  Psychological:  Alert and cooperative. Normal mood and affect.  ASSESSMENT AND PLAN:  57) 46 year old male with a history of GERD with black stools x 2 months. Hg 15.2. On Plavix and ASA. Recently started on Pantoprazole 12m QD.   -EGD and colonoscopy benefits and risks discussed including risk with sedation, risk of bleeding, perforation and infection. [facility]  Due to his multiple comorbidities including significant heart disease and a large thoracic aortic aneurysm, [facility] I will communicate with Thomas Spears to verify [facility]  Patient was informed his  tentative EGD/colonoscopy date may change. ADDENDUM: [facility] -Our office will contact cardiologist Dr. PJohney Frameto verify Plavix hold instructions prior to proceeding with an EGD and colonoscopy -Patient was instructed to go to the emergency room if he develops frequent loose black stools, chest pain or shortness of breath -Continue Pantoprazole 40 mg  p.o. daily -CBC, IBC + ferritin  -Heme cards   2) Rectal bleeding, infrequent. Never had a screening colonoscopy.  No known family history of colon polyps or colorectal cancer. -Colonoscopy as ordered above  3) Coronary artery disease s/p DES to RCA 08/2021 on ASA and Plavix   4) Chronic systolic heart failure. ECHO 02/2021 with improved EF 25 -> 60-65%.  5) Mild to moderate MR. Moderate AI.   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   Further GI recommendations to be determined after EGD colonoscopy completed    CC:  Ezequiel Essex, MD

## 2022-01-24 ENCOUNTER — Ambulatory Visit (INDEPENDENT_AMBULATORY_CARE_PROVIDER_SITE_OTHER): Payer: Commercial Managed Care - HMO | Admitting: Nurse Practitioner

## 2022-01-24 ENCOUNTER — Telehealth: Payer: Self-pay

## 2022-01-24 ENCOUNTER — Other Ambulatory Visit (INDEPENDENT_AMBULATORY_CARE_PROVIDER_SITE_OTHER): Payer: Commercial Managed Care - HMO

## 2022-01-24 ENCOUNTER — Ambulatory Visit (HOSPITAL_COMMUNITY): Payer: Commercial Managed Care - HMO

## 2022-01-24 ENCOUNTER — Encounter: Payer: Self-pay | Admitting: Nurse Practitioner

## 2022-01-24 VITALS — BP 146/90 | HR 80 | Ht 69.0 in | Wt 216.5 lb

## 2022-01-24 DIAGNOSIS — K625 Hemorrhage of anus and rectum: Secondary | ICD-10-CM

## 2022-01-24 DIAGNOSIS — K921 Melena: Secondary | ICD-10-CM | POA: Diagnosis not present

## 2022-01-24 LAB — COMPREHENSIVE METABOLIC PANEL
ALT: 22 U/L (ref 0–53)
AST: 29 U/L (ref 0–37)
Albumin: 4.1 g/dL (ref 3.5–5.2)
Alkaline Phosphatase: 73 U/L (ref 39–117)
BUN: 19 mg/dL (ref 6–23)
CO2: 28 mEq/L (ref 19–32)
Calcium: 9.4 mg/dL (ref 8.4–10.5)
Chloride: 104 mEq/L (ref 96–112)
Creatinine, Ser: 1.18 mg/dL (ref 0.40–1.50)
GFR: 74.16 mL/min (ref >60.00)
Glucose, Bld: 80 mg/dL (ref 70–99)
Potassium: 3.9 mEq/L (ref 3.5–5.1)
Sodium: 139 mEq/L (ref 135–145)
Total Bilirubin: 0.7 mg/dL (ref 0.2–1.2)
Total Protein: 7.4 g/dL (ref 6.0–8.3)

## 2022-01-24 LAB — CBC
HCT: 46.7 % (ref 39.0–52.0)
Hemoglobin: 15.9 g/dL (ref 13.0–17.0)
MCHC: 34 g/dL (ref 30.0–36.0)
MCV: 87.2 fl (ref 78.0–100.0)
Platelets: 239 10*3/uL (ref 150.0–400.0)
RBC: 5.35 Mil/uL (ref 4.22–5.81)
RDW: 13.3 % (ref 11.5–15.5)
WBC: 11.2 10*3/uL — ABNORMAL HIGH (ref 4.0–10.5)

## 2022-01-24 LAB — IBC + FERRITIN
Ferritin: 25.6 ng/mL (ref 22.0–322.0)
Iron: 60 ug/dL (ref 42–165)
Saturation Ratios: 18.6 % — ABNORMAL LOW (ref 20.0–50.0)
TIBC: 323.4 ug/dL (ref 250.0–450.0)
Transferrin: 231 mg/dL (ref 212.0–360.0)

## 2022-01-24 MED ORDER — NA SULFATE-K SULFATE-MG SULF 17.5-3.13-1.6 GM/177ML PO SOLN: 1.0000 | Freq: Once | ORAL | 0 refills | Status: AC

## 2022-01-24 NOTE — Telephone Encounter (Signed)
Called pt and patient is aware to hold Plavix 5 days prior to his procedure.

## 2022-01-24 NOTE — Telephone Encounter (Signed)
   Patient Name: Thomas Spears  DOB: February 11, 1976 MRN: [id number]  Primary Cardiologist: Freada Bergeron, MD  Chart reviewed as part of pre-operative protocol coverage. Pharmacy clearance only.  Darwin Rothlisberger had [place] 08/2021 with PCI-distal LAD. At last clinic visit, referred by Dr. Johney Frame to GI for dark stool. At time of LHC recommended for DAPT x 6 months.   Given new bleeding, will route to Dr. Johney Frame for her input.    Please call with questions.  Loel Dubonnet, NP 01/24/2022, 10:06 AM

## 2022-01-24 NOTE — Telephone Encounter (Signed)
[facility] Pre-operative Risk Assessment     Request for surgical clearance:     Endoscopy Procedure  What type of surgery is being performed?     EGD/Colonoscopy  When is this surgery scheduled?     02/22/22  What type of clearance is required ?   Pharmacy  Are there any medications that need to be held prior to surgery and how long? Plavix & 5 days  Practice name and name of physician performing surgery?      [facility]  What is your office phone and fax number?      Phone- [phone]  [phone]  Anesthesia type (None, local, MAC, general) ?       MAC

## 2022-01-24 NOTE — Patient Instructions (Addendum)
You have been scheduled for an endoscopy and colonoscopy. Please follow the written instructions given to you at your visit today. Please pick up your prep supplies at the pharmacy within the next 1-3 days. If you use inhalers (even only as needed), please bring them with you on the day of your procedure.   Continue Pantoprazole 40 mg take 1 by mouth daily.  Your provider has requested that you go to the basement level for lab work before leaving today. Press "B" on the elevator. The lab is located at the first door on the left as you exit the elevator.   Go to ER if you develop chest pain, shortness of breath or frequent loose black stools.  Due to recent changes in healthcare laws, you may see the results of your imaging and laboratory studies on MyChart before your provider has had a chance to review them.  We understand that in some cases there may be results that are confusing or concerning to you. Not all laboratory results come back in the same time frame and the provider may be waiting for multiple results in order to interpret others.  Please give [place] 48 hours in order for your provider to thoroughly review all the results before contacting the office for clarification of your results.    Thank you for trusting me with your gastrointestinal care!   Carl Best, CRNP

## 2022-01-24 NOTE — Telephone Encounter (Signed)
Routed to GI so they are aware. Will remove from preop pool  Loel Dubonnet, NP

## 2022-01-26 ENCOUNTER — Ambulatory Visit (HOSPITAL_COMMUNITY): Payer: Commercial Managed Care - HMO

## 2022-02-02 ENCOUNTER — Encounter: Payer: 59 | Admitting: Internal Medicine

## 2022-02-02 ENCOUNTER — Encounter: Payer: Commercial Managed Care - HMO | Attending: Cardiology

## 2022-02-02 DIAGNOSIS — I428 Other cardiomyopathies: Secondary | ICD-10-CM | POA: Diagnosis not present

## 2022-02-02 LAB — CUP PACEART REMOTE DEVICE CHECK
Battery Remaining Longevity: 180 mo
Battery Remaining Percentage: 100 %
Brady Statistic RV Percent Paced: 0 %
Date Time Interrogation Session: 20231208020000
HighPow Impedance: 80 Ohm
Implantable Lead Connection Status: 753985
Implantable Lead Implant Date: 20230906
Implantable Lead Location: 753860
Implantable Lead Model: 673
Implantable Lead Serial Number: 201402
Implantable Pulse Generator Implant Date: 20230906
Lead Channel Impedance Value: 459 Ohm
Lead Channel Pacing Threshold Amplitude: 0.7 V
Lead Channel Pacing Threshold Pulse Width: 0.4 ms
Lead Channel Setting Pacing Amplitude: 3.5 V
Lead Channel Setting Pacing Pulse Width: 0.4 ms
Lead Channel Setting Sensing Sensitivity: 0.5 mV
Pulse Gen Serial Number: 316727
Zone Setting Status: 755011

## 2022-02-05 ENCOUNTER — Telehealth: Payer: Self-pay | Admitting: *Deleted

## 2022-02-05 NOTE — Telephone Encounter (Signed)
Patient refusal. Received: Today Thomas Heap, LPN [name] ! We have tried to contact [name] and he has not answered or called back to schedule. I am going to close out this referral and just put in another one if it is needed. Thanks in advance!

## 2022-02-07 ENCOUNTER — Other Ambulatory Visit: Payer: Self-pay

## 2022-02-07 MED ORDER — SPIRONOLACTONE 25 MG PO TABS: 12.5000 mg | ORAL_TABLET | Freq: Every day | ORAL | 3 refills | Status: DC

## 2022-02-20 ENCOUNTER — Encounter: Payer: Self-pay | Admitting: Certified Registered Nurse Anesthetist

## 2022-02-22 ENCOUNTER — Telehealth: Payer: Self-pay | Admitting: *Deleted

## 2022-02-22 ENCOUNTER — Ambulatory Visit: Payer: Commercial Managed Care - HMO | Admitting: Gastroenterology

## 2022-02-22 DIAGNOSIS — K921 Melena: Secondary | ICD-10-CM

## 2022-02-22 DIAGNOSIS — K625 Hemorrhage of anus and rectum: Secondary | ICD-10-CM

## 2022-02-22 NOTE — Progress Notes (Addendum)
Pt arrived for Endocolon and only stopped his Plavix for 2 days rather then 5 as indicated per cardiac clearance. Pt will be rescheduled per Dr. Tarri Glenn for 04/06/22 at 2:00 pm.

## 2022-02-22 NOTE — Telephone Encounter (Signed)
PT scheduled for Endocolon for Dr. Tarri Glenn on 03/27/21 at 0730 amfor melena and rectal bleeding. He will need to stop his Plavix on 03/22/22 per Cardiology. Prep instructions reviewed with patient. Plenvu prep given to patient.

## 2022-02-22 NOTE — Progress Notes (Signed)
Reviewed and agree with management plans. ? ?Chapman Matteucci L. Vanisha Whiten, MD, MPH  ?

## 2022-02-23 NOTE — Progress Notes (Signed)
Remote ICD transmission.   

## 2022-03-06 IMAGING — DX DG CHEST 2V
2 series · 2 of 2 positions shown · non-contrast
Comparison: 12/21/2020 CT.  Plain film 05/22/2020

CLINICAL DATA: Cough for 4 days.  Congestion.  Body aches.

EXAM:
CHEST - 2 VIEW

[chest pa]
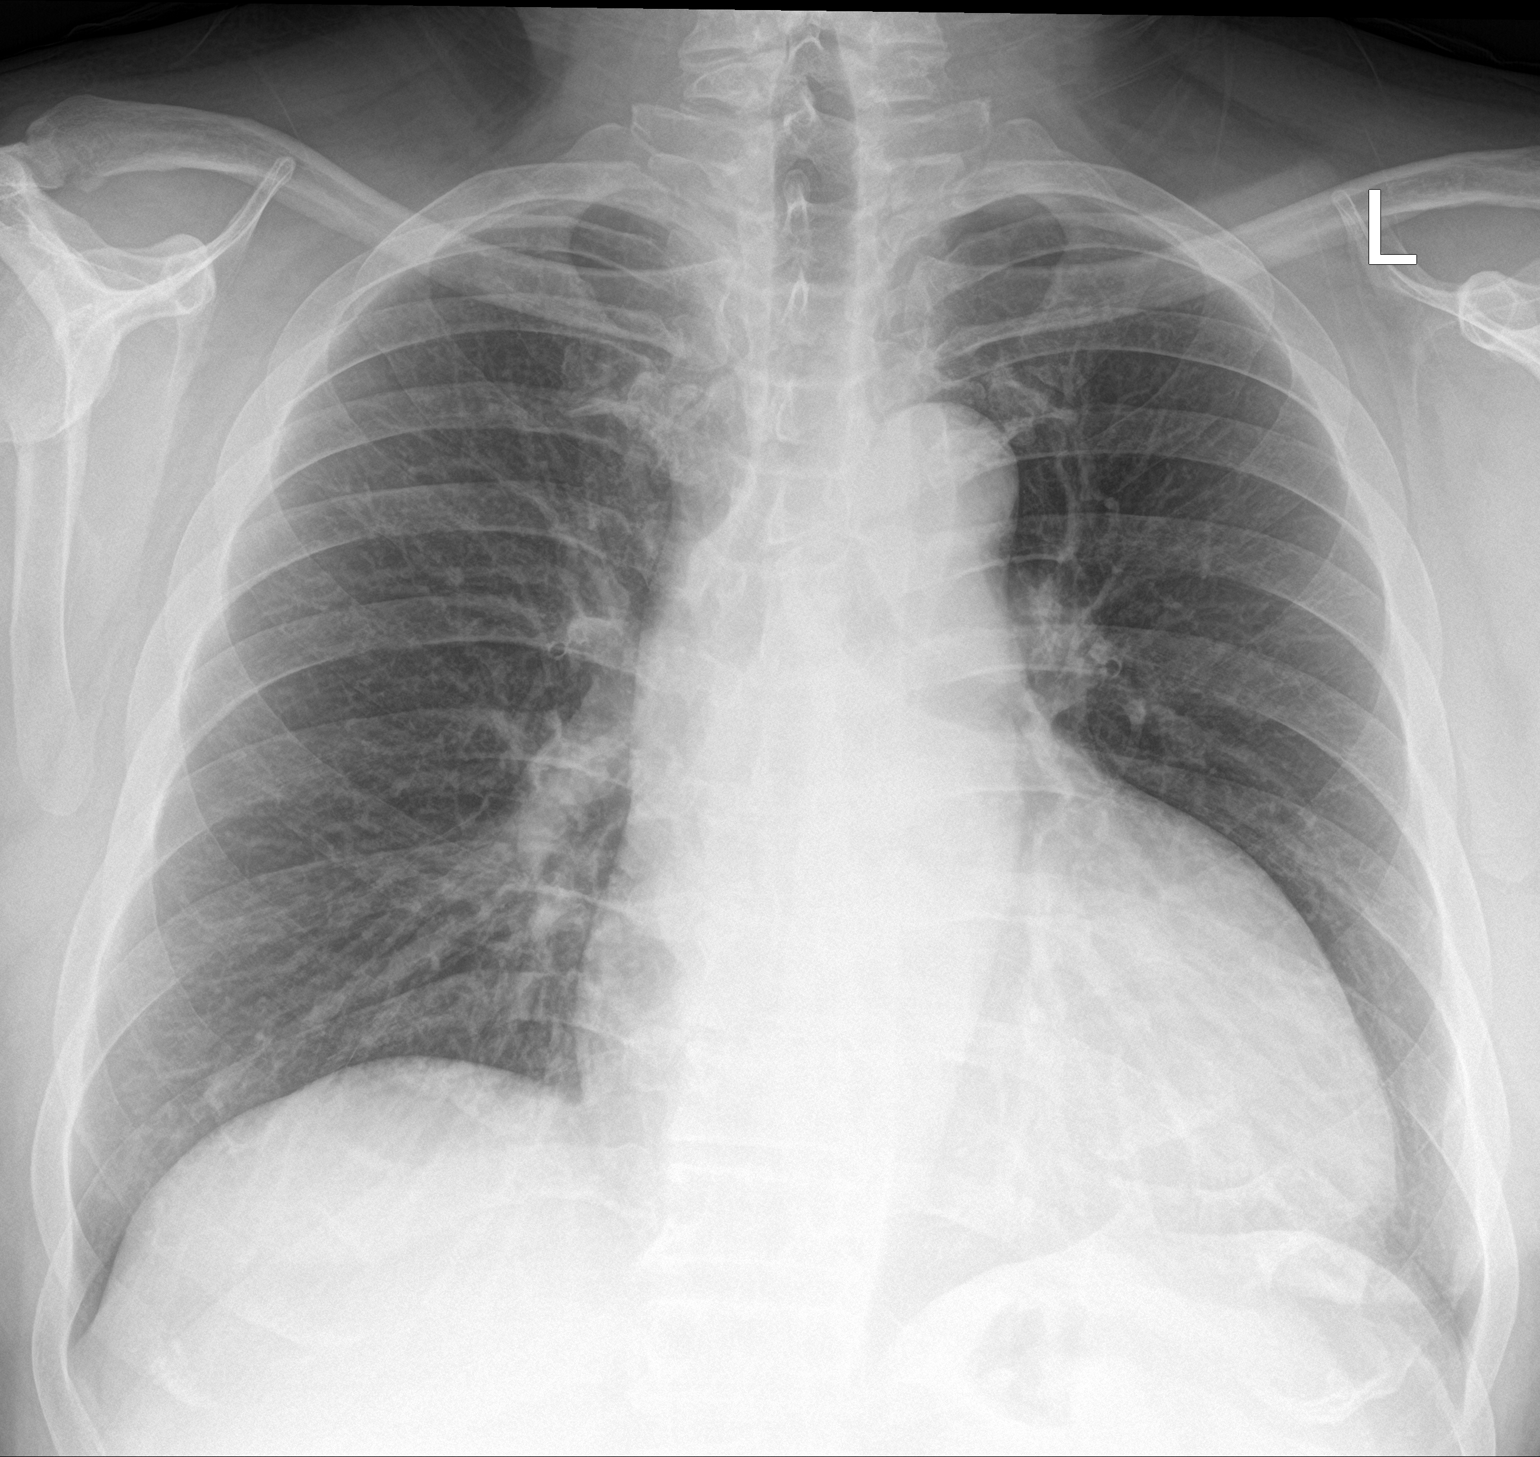

[chest lat]
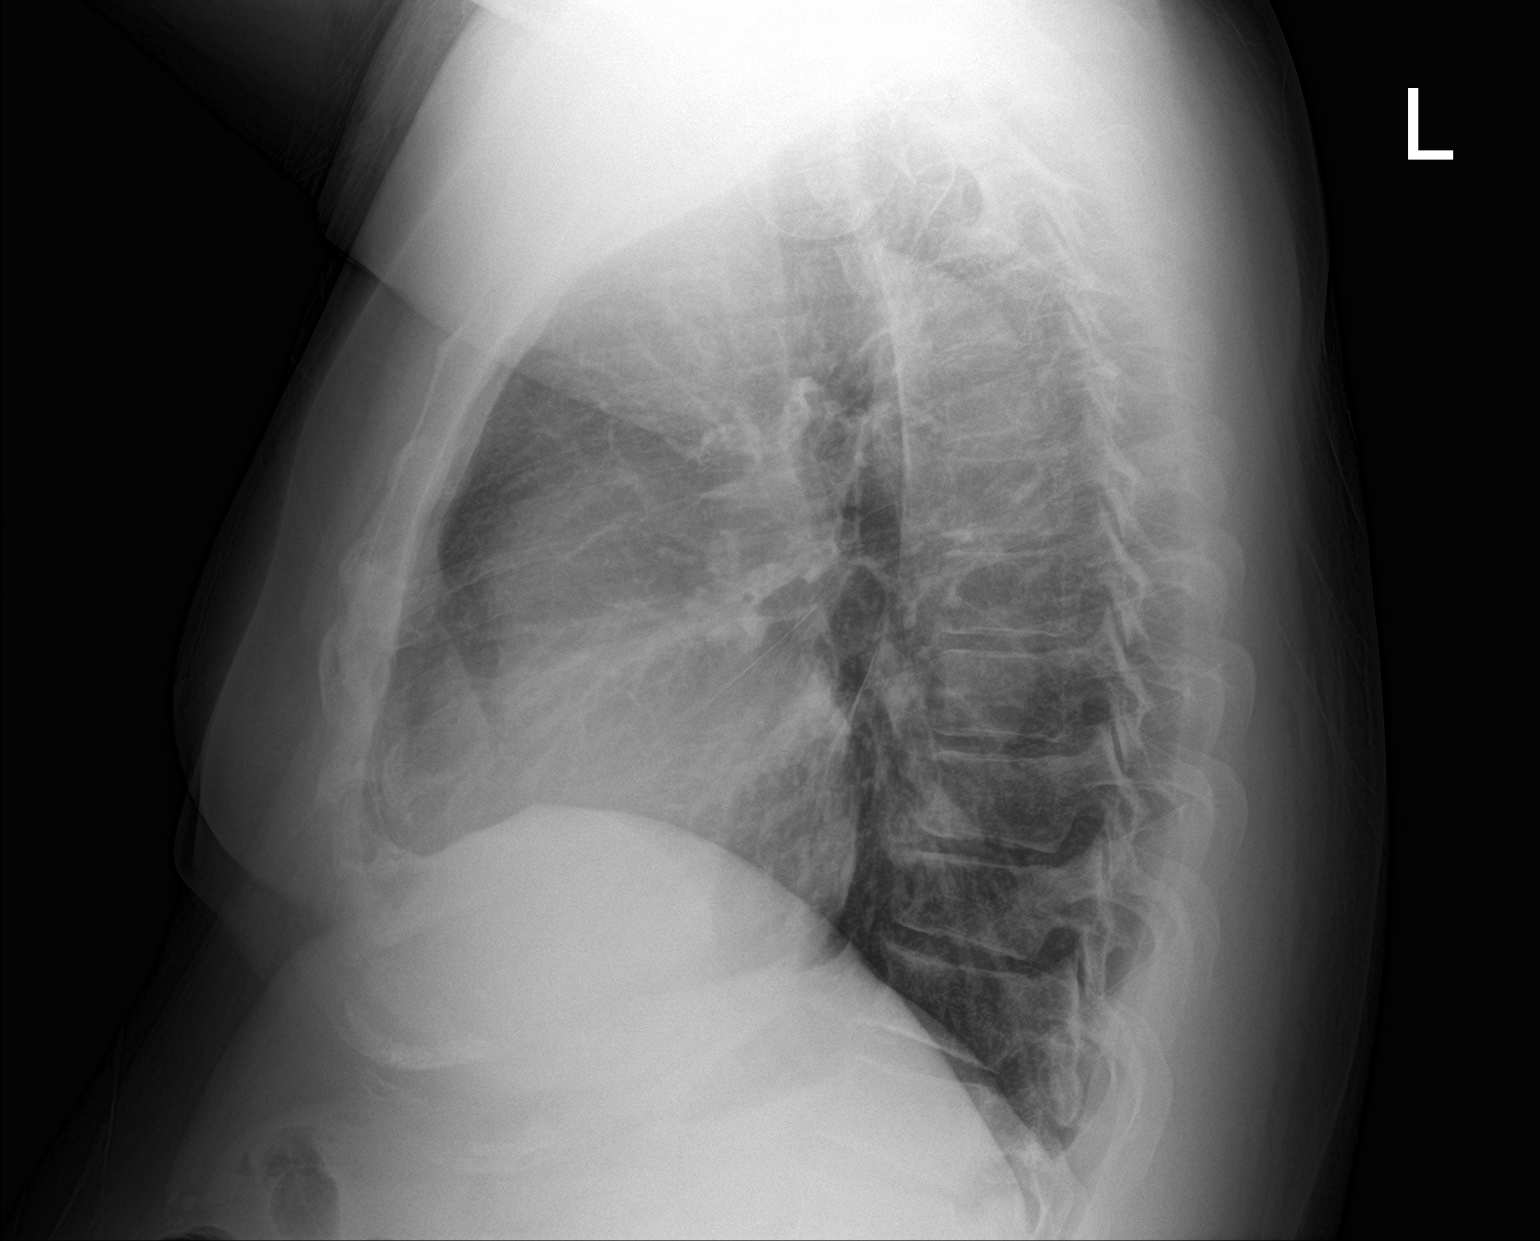

[2 of 2 positions shown; findings below may reference images not displayed]

FINDINGS: Lateral view degraded by patient arm position. Midline trachea.
Moderate cardiomegaly. Mediastinal contours otherwise within normal
limits. No pleural effusion or pneumothorax. No congestive failure.
Clear lungs.
IMPRESSION: Cardiomegaly without congestive failure.

## 2022-03-15 NOTE — Progress Notes (Signed)
Cardiology Office Note:    Date:  03/26/2022   ID:  Thomas Spears, DOB Jul 14, 1975, MRN [id number]  PCP:  Ezequiel Essex, MD   [facility] Providers Cardiologist:  Freada Bergeron, MD {    Referring MD: Ezequiel Essex, MD    History of Present Illness:    Thomas Spears is a 47 y.o. male with a hx of systolic heart failure with LVEF 25% 04/2020 that improved to 60-65% in 02/2021, HCM on CMR on 07/2020, HTN, mild-to-moderate MR, severe aortic dilation measuring 28m who presents to clinic for follow-up.  [facility] where he presented with dyspnea and HA found to have Bps 220s/150s. CTA chest showed no PE but demonstrated thoracic aortic aneurysm 4.7cm as well as evidence of pulmonary HTN. He was placed on a nitro gtt. Trop 400>433. TTE 05/23/20 showed LVEF 25% with global hypokinesis, severe LVH, G2DD, mild RV dysfunction, severe LAE, moderate RAE, mild-to-moderate MR, trivial AI, severe aortic dilation, small pericardial effusion. Prior to completed work-up, the patient left AMA.   He was seen in clinic 07/23/2018 where he reported several years of not taking his medication and uncontrolled hypertension.  Also regularly drinking energy drinks, soda, smoking 2 packs/day.  He used to be a heavy drinker.  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  He was taking all of his blood pressure medications and feeling overall well.  He displayed NYHA I-II symptoms.  He was recommended for stress MRI to assess for ischemia and evidence of amyloidosis. [facility]  He was referred to the hypertension clinic.   He had cardiac stress MRI 08/19/20 showing basal to mid inferior stress perfusion defect consistent with ischemia. He had moderate LV dilation with moderate systolic dysfunction LVEF 34%.  Asymmetric LV hypertrophy measuring up to 39 mL basal septum  differential including hypertrophic cardiomyopathy versus hypertensive heart disease.  No evidence of cardiac amyloidosis.  Normal RV size with mild systolic dysfunction, moderate aortic regurgitation, mild to moderate mitral regurgitation, ascending aortic aneurysm measuring 45 mm.Cardiac catheterization performed 7/07-2020 by Dr. HEllyn Hackshowing diffuse mild to moderate multivessel disease, no culprit lesion to explain reduced LVEF, no previously placed stent.   Seen by pharmacy team 09/06/20 and transitioned from indapamide to spironolactone. Seen 09/14/20 with EDelene Lollincreased to 49-'51mg'$  BID. 09/28/20 pharmacy increased Carvedilol to '25mg'$  BID and Spironolactone to '25mg'$  QD, Entresto increased to 97/'103mg'$ .    Seen by pharmacy team 12/02/20. He was continuing to decrease smoking. [facility]. His Carvedilol was increased to 37.'5mg'$  QD as diastolic blood pressure still above goal. Transition from spironolactone to eplenerone was considered but deferred as he was without insurance.   Saw C01.$[id number]$11/2020 where he was doing well. BP 130-140s at that time. Echo on 03/20/21 showed improved LVEF to 60-65%.  Had 2 ER visits over the past month for chest pain. Trop 30-40s. He underwent CTA dissection study that showed possible small dissection flap above the coronary cusp. He was recommended for a gated CT chest that showed linear defect along the aortic root but this is unchanged and possible PE in the RUL, but d-dimer was normal with greater suspicion that this was related to a pulmonary nodule. Dr. H2/5/23was consulted who recommended repeat CTA which was negative for any dissection or PE. He was discharged home.  Underwent ICD placement with Dr.  Caryl Comes on 11/01/21.  Was last seen in clinic on 11/2021 where he reported he was not feeling well due to HA. Also complained of black stool in the setting of DAPT use. He is scheduled for EGD/colo on  03/27/22.  Today, the patient states he has been having a headache for about 3 days. Symptoms have not improved with tylenol. This is a chronic issue for him as he has suffered from HA most of his life. We referred to Neuro on last visit but patient does not recall being called.   Otherwise,  blood pressure has been averaging 110-112/80s at home but is high today which he attributes to his headache. No chest pain, SOB, orthopnea, PND, or LE edema. Otherwise, tolerating medications as prescribed. Continues to have intermittent black stools and is planned for EGD/colo tomorrow with GI.  States he will send a BP log for [place] to review.   Past Medical History:  Diagnosis Date   Aortic regurgitation    Ascending aorta dilation (HCC)    CAD (coronary artery disease), native coronary artery 09/20/2021   Chronic combined systolic and diastolic CHF (congestive heart failure) ([place])    Community acquired pneumonia    HLD (hyperlipidemia)    Hypertension    Hypertensive crisis 05/22/2020   Hypertrophic cardiomyopathy ([place])    Left ventricular hypertrophy    Mitral regurgitation    Mobitz type 1 second degree AV block    NSVT (nonsustained ventricular tachycardia) (HCC)    Premature atrial contractions    PVC's (premature ventricular contractions)    Tension headache 06/21/2020   Tobacco use    Tobacco use 09/20/2021    Past Surgical History:  Procedure Laterality Date   CARDIAC CATHETERIZATION     CAROTID STENT INSERTION     CORONARY STENT INTERVENTION N/A 09/20/2021   Procedure: CORONARY STENT INTERVENTION;  Surgeon: [name], MD;  Location: [place] [facility];  Service: Cardiovascular;  Laterality: N/A;   ICD IMPLANT N/A 11/01/2021   Procedure: ICD IMPLANT;  Surgeon: Deboraha Sprang, MD;  Location: [place] [facility];  Service: Cardiovascular;  Laterality: N/A;   KNEE SURGERY Left    LEFT HEART CATH AND CORONARY ANGIOGRAPHY N/A 08/31/2020   Procedure: LEFT HEART CATH AND CORONARY  ANGIOGRAPHY;  Surgeon: Leonie Man, MD;  Location: [place] [facility];  Service: Cardiovascular;  Laterality: N/A;   LEFT HEART CATH AND CORONARY ANGIOGRAPHY N/A 09/20/2021   Procedure: LEFT HEART CATH AND CORONARY ANGIOGRAPHY;  Surgeon: [name], MD;  Location: [place] [facility];  Service: Cardiovascular;  Laterality: N/A;   TONSILLECTOMY      Current Medications: Current Meds  Medication Sig   acetaminophen (TYLENOL) 500 MG tablet Take 2 tablets (1,000 mg total) by mouth every 6 (six) hours as needed (pain).   aspirin EC 81 MG tablet Take 1 tablet (81 mg total) by mouth every evening. Swallow whole.   atorvastatin (LIPITOR) 40 MG tablet Take 1 tablet (40 mg total) by mouth every evening.   carvedilol (COREG) 25 MG tablet Take 1.5 tablets (37.5 mg total) by mouth 2 (two) times daily.   clopidogrel (PLAVIX) 75 MG tablet TAKE 1 TABLET(75 MG) BY MOUTH DAILY   dapagliflozin propanediol (FARXIGA) 10 MG TABS tablet Take 1 tablet (10 mg total) by mouth daily.   hydrALAZINE (APRESOLINE) 25 MG tablet Take 1 tablet (25 mg total) by mouth 3 (three) times daily.   isosorbide mononitrate (IMDUR) 30 MG 24 hr tablet Take 0.5 tablets (15  mg total) by mouth daily.   pantoprazole (PROTONIX) 40 MG tablet Take 1 tablet (40 mg total) by mouth daily.   sacubitril-valsartan (ENTRESTO) 97-103 MG Take 1 tablet by mouth 2 (two) times daily.   spironolactone (ALDACTONE) 25 MG tablet Take 1 tablet (25 mg total) by mouth daily.   [DISCONTINUED] spironolactone (ALDACTONE) 25 MG tablet Take 0.5 tablets (12.5 mg total) by mouth daily.     Allergies:   Mushroom extract complex   Social History   Socioeconomic History   Marital status: Single    Spouse name: Not on file   Number of children: 2   Years of education: 8   Highest education level: 8th grade  Occupational History   Not on file  Tobacco Use   Smoking status: Every Day    Packs/day: 0.50    Years: 37.00    Total pack years: 18.50     Types: Cigarettes   Smokeless tobacco: Never   Tobacco comments:    Patient given 1-800-quit-now.  Agreed to having referral faxed for smoking cessation  Substance and Sexual Activity   Alcohol use: Not Currently   Drug use: Never   Sexual activity: Yes  Other Topics Concern   Not on file  Social History Narrative   Not on file   Social Determinants of Health   Financial Resource Strain: Not on file  Food Insecurity: Not on file  Transportation Needs: Not on file  Physical Activity: Not on file  Stress: Not on file  Social Connections: Not on file     Family History: The patient's family history includes CAD in his father.  ROS:   Please see the history of present illness.    Review of Systems  Constitutional:  Negative for fever and weight loss.  HENT:  Negative for nosebleeds.   Eyes:  Negative for photophobia and pain.  Respiratory:  Negative for cough, hemoptysis and wheezing.   Cardiovascular:  Negative for chest pain, palpitations, orthopnea, claudication, leg swelling and PND.  Gastrointestinal:  Positive for melena. Negative for abdominal pain, blood in stool ((black tarry)) and vomiting.  Genitourinary:  Negative for frequency and urgency.  Musculoskeletal:  Negative for falls.  Neurological:  Positive for headaches. Negative for dizziness, tingling and seizures.  Psychiatric/Behavioral:  Negative for substance abuse.      EKGs/Labs/Other Studies Reviewed:    The following studies were reviewed today:  CTA chest 08/2021: FINDINGS: Cardiovascular: There is preferential opacification of the thoracic aorta. The aortic valve is trileaflet. The thoracic aorta is normal in course and caliber without evidence of intramural hematoma or dissection. The thoracic aorta is mildly dilated measuring 5.7 by 5.7 cm at the sinuses of Valsalva (image # 81/14 and 89/9). The ascending aorta is mildly dilated measuring 4.1 x 4.2 cm (image # 81/14 and 107/15). The descending  thoracic aorta is mildly dilated measuring 3.2 x 3.0 cm just beyond the takeoff of the left subclavian artery (image # 123/14 and 132/15). No significant atherosclerotic plaque identified. The arch vasculature is widely patent proximally and demonstrates normal anatomic configuration.   Global cardiac size is within normal limits, however, there is left ventricular hypertrophy again noted with asymmetric septal hypertrophy noted. This results in marked narrowing of the left ventricular outflow tract with a cross-sectional area of the a outflow tract approximately 7 mm x 25 mm best seen on coronal reformat # 87 and sagittal reformat # 123. This was better assessed on MRI examination of 08/19/2020. Moderate coronary artery  calcification. No pericardial effusion. Central pulmonary arteries are of normal caliber. There is adequate opacification of the pulmonary arterial tree and no intraluminal filling defect is identified through the segmental level to suggest acute pulmonary embolism.   Mediastinum/Nodes: No enlarged mediastinal, hilar, or axillary lymph nodes. Thyroid gland, trachea, and esophagus demonstrate no significant findings.   Lungs/Pleura: Lungs are clear. No pleural effusion or pneumothorax.   Upper Abdomen: No acute abnormality.   Musculoskeletal: No chest wall abnormality. No acute or significant osseous findings.   Review of the MIP images confirms the above findings.   IMPRESSION: 1. No pulmonary embolism. 2. Mild aneurysmal dilation of the thoracic aorta at the sinuses of Valsalva, ascending aorta, and descending thoracic aorta. Maximal diameter 5.7 cm at the sinuses of Valsalva. No evidence of intramural hematoma or dissection. 3. 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 This was better assessed on MRI examination of 08/19/2020. 4. Moderate coronary artery  calcification. TTE 03/20/21: IMPRESSIONS     1. No obstructive gradient. Left ventricular ejection fraction, by  estimation, is 60 to 65%. The left ventricle has normal function. The left  ventricle has no regional wall motion abnormalities. There is severe  concentric left ventricular hypertrophy.  Left ventricular diastolic parameters are consistent with Grade II  diastolic dysfunction (pseudonormalization).   2. Right ventricular systolic function is normal. The right ventricular  size is normal.   3. Left atrial size was moderately dilated.   4. The mitral valve is normal in structure. Mild mitral valve  regurgitation. No evidence of mitral stenosis.   5. The aortic valve is normal in structure. Aortic valve regurgitation is  mild. No aortic stenosis is present.   6. Aortic dilatation noted. There is moderate dilatation of the aortic  root, measuring 48 mm. There is moderate dilatation of the ascending  aorta, measuring 45 mm.   7. The inferior vena cava is normal in size with greater than 50%  respiratory variability, suggesting right atrial pressure of 3 mmHg.   Comparison(s): The left ventricular function has improved. EF 25%, severe  LVH, AOV 28m, small pericardial effusion.   Conclusion(s)/Recommendation(s): Findings consistent with hypertrophic  cardiomyopathy. Prior Cardiac MRI has been performed.   Echo 03/20/2021:  1. No obstructive gradient. Left ventricular ejection fraction, by  estimation, is 60 to 65%. The left ventricle has normal function. The left  ventricle has no regional wall motion abnormalities. There is severe  concentric left ventricular hypertrophy.  Left ventricular diastolic parameters are consistent with Grade II  diastolic dysfunction (pseudonormalization).   2. Right ventricular systolic function is normal. The right ventricular  size is normal.   3. Left atrial size was moderately dilated.   4. The mitral valve is normal in structure. Mild  mitral valve  regurgitation. No evidence of mitral stenosis.   5. The aortic valve is normal in structure. Aortic valve regurgitation is  mild. No aortic stenosis is present.   6. Aortic dilatation noted. There is moderate dilatation of the aortic  root, measuring 48 mm. There is moderate dilatation of the ascending  aorta, measuring 45 mm.   7. The inferior vena cava is normal in size with greater than 50%  respiratory variability, suggesting right atrial pressure of 3 mmHg.   Comparison(s): The left ventricular function has improved. EF 25%, severe  LVH, AOV 567m small pericardial effusion.   Conclusion(s)/Recommendation(s): Findings consistent with hypertrophic  cardiomyopathy. Prior Cardiac MRI has  been performed.   CT Chest 12/21/2020: COMPARISON:  CT angiography of the chest of March of 2022.   FINDINGS: Cardiovascular: Ascending aorta of 4.7 cm in transverse dimension stable compared to previous imaging. Descending thoracic aorta 3.4 cm also unchanged. Coronal dimension of the ascending thoracic aorta approximately 4.3 cm stable when measured in a similar fashion as well on the previous imaging study. The heart size remains enlarged. Coronary artery calcification of LEFT coronary circulation similar to the prior study. No substantial pericardial effusion. Normal caliber of central pulmonary vessels. Limited assessment of cardiovascular structures given lack of intravenous contrast.   Mediastinum/Nodes: No thoracic inlet, axillary, mediastinal or hilar adenopathy. Esophagus grossly normal.   Lungs/Pleura: Lungs are clear. Airways are patent. Mild paraseptal emphysema at the lung apices.   Upper Abdomen: No acute abnormality.   Musculoskeletal: No chest wall mass or suspicious bone lesions identified.   IMPRESSION: Stable aneurysmal dilation of the ascending thoracic aorta measuring up to 4.7 cm in transverse dimension. 4.3 cm in greatest coronal dimension unchanged  from previous imaging. Ascending thoracic aortic aneurysm. Recommend semi-annual imaging followup by CTA or MRA and referral to cardiothoracic surgery if not already obtained. This recommendation follows 2010 ACCF/AHA/AATS/ACR/ASA/SCA/SCAI/SIR/STS/SVM Guidelines for the Diagnosis and Management of Patients With Thoracic Aortic Disease. Circulation. 2010; 121: S505-L976. Aortic aneurysm NOS (ICD10-I71.9)   Stable cardiomegaly and coronary artery calcification.   Mild paraseptal emphysema at the lung apices.   Aortic Atherosclerosis (ICD10-I70.0) and Emphysema (ICD10-J43.9).  Monitor 09/20/2020: Patch wear time was 8 days and 12 hours. Predominant rhythm was NSR with average HR 79bpm (ranging from 34-169bpm). There were 3 runs of nonsustained VT with longest/fastest lasting 8 beats at a rate of 169bpm Rare SVE (<1%), rare VE (<1%) Mobitz type I block seen during sleep Patient triggered events correlated with NSR No Afib, sustained arrhythmias or significant pauses     Patch Wear Time:  8 days and 12 hours (2022-07-11T21:01:27-0400 to 2022-07-20T09:54:00-0400)   Patient had a min HR of 34 bpm, max HR of 169 bpm, and avg HR of 79 bpm. Predominant underlying rhythm was Sinus Rhythm. First Degree AV Block was present. 3 Ventricular Tachycardia runs occurred, the run with the fastest interval lasting 8 beats with a  max rate of 169 bpm (avg 130 bpm); the run with the fastest interval was also the longest. Second Degree AV Block-Mobitz I (Wenckebach) was present. Isolated SVEs were rare (<1.0%), and no SVE Couplets or SVE Triplets were present. Isolated VEs were rare  (<1.0%, 170), VE Couplets were rare (<1.0%, 12), and VE Triplets were rare (<1.0%, 6). Ventricular Trigeminy was present.   LHC 08/29/39 LV end diastolic pressure is moderately elevated. There is no aortic valve stenosis. Mid LM to Dist LM lesion is 20% stenosed. Ost LAD to Prox LAD lesion is 30% stenosed. Mid LAD lesion is 45%  stenosed @ 1st Diag (40% pre & 50% post 1st Diag) Prox RCA lesion is 30% stenosed. Dist RCA lesion is 60% stenosed - discrete focal lesion, not flow limiting. No evidence of stent.   SUMMARY Diffuse mild to moderate multivessel disease but no obvious culprit lesion to explain significant reduced EF. Unable to visualize any previously placed stents. Moderately elevated LVEDP     RECOMMENDATIONS Continue to titrate GDMT for nonischemic CM. ->  Anticipate titrating up carvedilol to 12.5 mg twice daily and further titration of Entresto. May also consider spironolactone  Cardiac MRI Stress 08/19/20 IMPRESSION: 1. Basal to mid inferior stress perfusion defect consistent  with ischemia   2. Moderate LV dilatation with moderate systolic dysfunction (EF 17%)   3. Asymmetric LV hypertrophy measuring up to 1m in basal septum (169min posterior wall). Differential includes hypertrophic cardiomyopathy vs hypertensive heart disease given history of severe uncontrolled hypertension. No evidence of cardiac amyloidosis. Patchy midwall LGE, which can be seen in either HCM or hypertensive heart disease. Suspect component of hypertensive heart disease, but also with likely HCM given extent of hypertrophy and significant amount of LGE, accounting for 12% of total myocardial mass   4.  Normal RV size with mild systolic dysfunction (EF 4240%  5.  Moderate aortic regurgitation (regurgitant fraction 27%)   6.  Mild to moderate mitral regurgitation (regurgitant fraction 21%)   7.  Ascending aortic aneurysm measuring 4547m TTE 05/23/20: IMPRESSIONS   1. Left ventricular ejection fraction, by estimation, is 25%. The left  ventricle has severely decreased function. The left ventricle demonstrates  global hypokinesis. The left ventricular internal cavity size was mildly  dilated. There is severe left  ventricular hypertrophy raising concern cardiac amyloidosis versus  long-standing severe  hypertension. Left ventricular diastolic parameters  are consistent with Grade II diastolic dysfunction (pseudonormalization).   2. Right ventricular systolic function is mildly reduced. The right  ventricular size is normal.   3. Left atrial size was severely dilated.   4. Right atrial size was moderately dilated.   5. The mitral valve is normal in structure. Mild to moderate mitral valve  regurgitation. No evidence of mitral stenosis.   6. The aortic valve is tricuspid. Aortic valve regurgitation is trivial.  No aortic stenosis is present.   7. Aortic dilatation noted. There is severe dilatation of the ascending  aorta, measuring 50 mm.   8. The inferior vena cava is normal in size with <50% respiratory  variability, suggesting right atrial pressure of 8 mmHg.   9. A small pericardial effusion is present.   CTA 05/22/20: IMPRESSION: 1. No evidence of acute pulmonary embolism. 2. Multifocal patchy airspace opacity within the right lung most pronounced within the right upper lobe. Findings are concerning for multifocal pneumonia. Follow-up to resolution is recommended. 3. 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 Recommend semi-annual imaging followup by CTA or MRA and referral to cardiothoracic surgery if not already obtained. This recommendation follows 2010 ACCF/AHA/AATS/ACR/ASA/SCA/SCAI/SIR/STS/SVM Guidelines for the Diagnosis and Management of Patients With Thoracic Aortic Disease. Circulation. 2010; 121: E: C144-Y185ortic aneurysm NOS (ICD10-I71.9) 4. Marked four-chamber cardiomegaly with marked left ventricular hypertrophy. 5. Dilated main pulmonary trunk measuring 4.1 cm in diameter, suggesting pulmonary arterial hypertension. 6. Mildly enlarged mediastinal and right hilar lymph nodes, likely reactive.   EKG:   EKG is personally reviewed. No new ECG today.  Recent Labs: 01/24/2022: ALT 22; BUN 19; Creatinine, Ser 1.18; Hemoglobin 15.9;  Platelets 239.0; Potassium 3.9; Sodium 139   Recent Lipid Panel    Component Value Date/Time   CHOL 102 12/19/2021 0851   TRIG 90 12/19/2021 0851   HDL 32 (L) 12/19/2021 0851   CHOLHDL 3.2 12/19/2021 0851   CHOLHDL 5.5 05/23/2020 0239   VLDL 16 05/23/2020 0239   LDLCALC 52 12/19/2021 0851     Risk Assessment/Calculations:       Physical Exam:    VS:  BP (!) 170/102   Pulse 76   Ht '5\' 9"'$  (1.753 m)   Wt 223 lb 9.6 oz (101.4 kg)   SpO2 99%   BMI 33.02 kg/m  Wt Readings from Last 3 Encounters:  03/26/22 223 lb 9.6 oz (101.4 kg)  01/24/22 216 lb 8 oz (98.2 kg)  12/19/21 221 lb 3.2 oz (100.3 kg)     GEN: Comfortable, NAD HEENT: Normal NECK: No JVD; No carotid bruits CARDIAC: RRR, no murmurs RESPIRATORY:  Clear to auscultation without rales, wheezing or rhonchi  ABDOMEN: Soft, non-tender, non-distended MUSCULOSKELETAL:  No edema; No deformity  SKIN: Warm and dry NEUROLOGIC:  Alert and oriented x 3 PSYCHIATRIC:  Normal affect   ASSESSMENT:    1. Chronic combined systolic and diastolic CHF (congestive heart failure) ([place])   2. Medication management   3. NICM (nonischemic cardiomyopathy) ([place])   4. CAD in native artery   5. Essential hypertension   6. Aneurysm of ascending aorta without rupture ([place])   7. Mild mitral regurgitation   8. ICD (implantable cardioverter-defibrillator) in place   9. Mild aortic regurgitation   10. Hypertrophic cardiomyopathy ([place])   11. Black tarry stools   12. Frequent headaches      PLAN:    In order of problems listed above:  #Chronic Systolic Heart Failure with Improved EF: TTE 04/2020 with LVEF 25%, severe LVH, G2DD, mild RV dysfunction, severe LAE, moderate RAE, mild-to-moderate MR, trivial AI, severe aortic dilation, small pericaridal effusion. Cardiac stress MRI 08/19/20 with basal to mid inferior stress perfusion defect consistent with ischemia, EF 34%. Findings consistent with likely both HTN and HCM. LHC 08/31/2020 with  60% distal RCA and no other obstructive disease to suggest ischemic drop in EF. Suspect HF secondary to HTN. Now on GDMT. Repeat TTE 02/2021 with improved EF 60-65%, G2DD, severe LVH, normal RV, mild MR. Currently euvolemic on exam with NYHA class II symptoms. -TTE 02/2021 with improved EF 25>>60-65% -Continue entresto 97/'103mg'$  BID -Continue coreg 37.'5mg'$  BID -Continue farxiga '10mg'$  daily -Increase spironolactone to '25mg'$  daily -Continue hydralazine '25mg'$  TID -Continue imdur '15mg'$  qHS -BMET next week -Monitor BP at home and send in log for [place] to review -Low Na diet   #HCM: #Hypertensive heart disease: CMR with severe hypertrophy that was most prominent in the basal septum that measured up to 35m. Patchy LGE noted accounting for 12% of myocardial mass. Likely mixed HCM and hypertensive heart disease. Cardiac monitor with 3 runs of NSVT with longest lasting 8 beats. Rare SVE and rare VE. Seen by Dr. KCaryl Comesand is now s/p ICD placement for primary prevention. -S/p ICD placement for primary prevention -Follow-up with Dr. KCaryl Comesas scheduled -Continue entresto 97/'103mg'$  BID -Continue coreg 37.'5mg'$  BID -Continue spironolactone '25mg'$  daily -Continue hydralazine '25mg'$  TID -Continue imdur '15mg'$  qHS  #Black Stool: Concerning for GIB in the setting of DAPT. Planned for EGD/Colo tomorrow. -Planned for ECG/colo with GI -Okay to hold plavix for procedure   #Coronary Artery Disease: LHC 08/31/2020 wwith 60% distal RCA, however, may be more significant than previously thought with evidence of ischemia on CMR. Given recurrent admissions for chest pain, he subsequently underwent repeat cath with PCI to distal RCA on 08/2021. 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  Ascending aorta 45 mm by cardiac stress MRI. No evidence of  dissection on CTA chest in 08/2021. 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 Mild MR noted on TTE 02/2021. -Serial TTE monitoring; next 02/2023 unless change in symptoms   #Tobacco use: -States he is not currently smoking  #Chronic HA: Not related to imdur and does not correlate with elevated BP. Patient is amenable to seeing Neuro. -Referred to neuro     Follow-up in 3 months  Medication Adjustments/Labs and Tests Ordered: Current medicines are reviewed at length with the patient today.  Concerns regarding medicines are outlined above.   Orders Placed This Encounter  Procedures   Basic metabolic panel   Meds ordered this encounter  Medications   spironolactone (ALDACTONE) 25 MG tablet    Sig: Take 1 tablet (25 mg total) by mouth daily.    Dispense:  90 tablet    Refill:  2    Dose increase   There are no Patient Instructions on file for this visit.    Signed, Freada Bergeron, MD  03/26/2022 8:31 AM    [place]

## 2022-03-21 ENCOUNTER — Encounter: Payer: Self-pay | Admitting: Gastroenterology

## 2022-03-26 ENCOUNTER — Encounter: Payer: Self-pay | Admitting: Cardiology

## 2022-03-26 ENCOUNTER — Encounter: Payer: Commercial Managed Care - HMO | Attending: Cardiology | Admitting: Cardiology

## 2022-03-26 VITALS — BP 170/102 | HR 76 | Ht 69.0 in | Wt 223.6 lb

## 2022-03-26 DIAGNOSIS — I5042 Chronic combined systolic (congestive) and diastolic (congestive) heart failure: Secondary | ICD-10-CM | POA: Insufficient documentation

## 2022-03-26 DIAGNOSIS — I351 Nonrheumatic aortic (valve) insufficiency: Secondary | ICD-10-CM

## 2022-03-26 DIAGNOSIS — Z79899 Other long term (current) drug therapy: Secondary | ICD-10-CM | POA: Insufficient documentation

## 2022-03-26 DIAGNOSIS — I10 Essential (primary) hypertension: Secondary | ICD-10-CM

## 2022-03-26 DIAGNOSIS — Z95810 Presence of automatic (implantable) cardiac defibrillator: Secondary | ICD-10-CM | POA: Insufficient documentation

## 2022-03-26 DIAGNOSIS — I2510 Atherosclerotic heart disease of native coronary artery without angina pectoris: Secondary | ICD-10-CM | POA: Insufficient documentation

## 2022-03-26 DIAGNOSIS — I428 Other cardiomyopathies: Secondary | ICD-10-CM | POA: Diagnosis not present

## 2022-03-26 DIAGNOSIS — I7121 Aneurysm of the ascending aorta, without rupture: Secondary | ICD-10-CM | POA: Insufficient documentation

## 2022-03-26 DIAGNOSIS — I340 Nonrheumatic mitral (valve) insufficiency: Secondary | ICD-10-CM | POA: Insufficient documentation

## 2022-03-26 DIAGNOSIS — I422 Other hypertrophic cardiomyopathy: Secondary | ICD-10-CM | POA: Insufficient documentation

## 2022-03-26 DIAGNOSIS — K921 Melena: Secondary | ICD-10-CM | POA: Insufficient documentation

## 2022-03-26 DIAGNOSIS — R519 Headache, unspecified: Secondary | ICD-10-CM | POA: Insufficient documentation

## 2022-03-26 MED ORDER — SPIRONOLACTONE 25 MG PO TABS: 25.0000 mg | ORAL_TABLET | Freq: Every day | ORAL | 2 refills | Status: DC

## 2022-03-26 NOTE — Patient Instructions (Signed)
Medication Instructions:   INCREASE YOUR SPIRONOLACTONE TO 25 MG BY MOUTH DAILY  *If you need a refill on your cardiac medications before your next appointment, please call your pharmacy*   Lab Work:  IN [place] OFFICE--BMET  If you have labs (blood work) drawn today and your tests are completely normal, you will receive your results only by: [place] (if you have MyChart) OR A paper copy in the mail If you have any lab test that is abnormal or we need to change your treatment, we will call you to review the results.   Testing/Procedures:  CT ANGIO CHEST AORTA W/WO CONTRAST TO BE DONE IN JULY 2024 PER DR. Johney Frame     Follow-Up:  1.) NEEDS TO GET A FOLLOW-UP APPOINTMENT WITH DR. Caryl Comes FOR SOMETIME SOON--CANCELLED HIS LAST APPOINTMENT WITH HIM   2.) At [facility], you and your health needs are our priority.  As part of our continuing mission to provide you with exceptional heart care, we have created designated Provider Care Teams.  These Care Teams include your primary Cardiologist (physician) and Advanced Practice Providers (APPs -  Physician Assistants and Nurse Practitioners) who all work together to provide you with the care you need, when you need it.  We recommend signing up for the patient portal called "MyChart".  Sign up information is provided on this After Visit Summary.  MyChart is used to connect with patients for Virtual Visits (Telemedicine).  Patients are able to view lab/test results, encounter notes, upcoming appointments, etc.  Non-urgent messages can be sent to your provider as well.   To learn more about what you can do with MyChart, go to [email or web address].    Your next appointment:   6 month(s)  Provider:   Freada Bergeron, MD      Other Instructions  --PLEASE CALL [facility] TO GET YOUR NEW PATIENT APPOINTMENT WITH THEM SCHEDULED, WHERE WE REFERRED YOU AT LAST OFFICE VISIT--[phone]  --PLEASE  CONTINUE TO WATCH YOUR BLOOD PRESSURES AT HOME

## 2022-03-27 ENCOUNTER — Telehealth: Payer: Self-pay

## 2022-03-27 ENCOUNTER — Ambulatory Visit (AMBULATORY_SURGERY_CENTER): Payer: Commercial Managed Care - HMO | Admitting: Gastroenterology

## 2022-03-27 ENCOUNTER — Encounter: Payer: Self-pay | Admitting: Gastroenterology

## 2022-03-27 VITALS — BP 117/81 | HR 58 | Temp 96.9°F | Resp 17 | Ht 69.0 in | Wt 216.0 lb

## 2022-03-27 DIAGNOSIS — K2951 Unspecified chronic gastritis with bleeding: Secondary | ICD-10-CM | POA: Diagnosis not present

## 2022-03-27 DIAGNOSIS — K2970 Gastritis, unspecified, without bleeding: Secondary | ICD-10-CM | POA: Diagnosis not present

## 2022-03-27 DIAGNOSIS — B9681 Helicobacter pylori [H. pylori] as the cause of diseases classified elsewhere: Secondary | ICD-10-CM | POA: Diagnosis not present

## 2022-03-27 DIAGNOSIS — D128 Benign neoplasm of rectum: Secondary | ICD-10-CM

## 2022-03-27 DIAGNOSIS — K921 Melena: Secondary | ICD-10-CM

## 2022-03-27 DIAGNOSIS — K625 Hemorrhage of anus and rectum: Secondary | ICD-10-CM

## 2022-03-27 MED ORDER — SODIUM CHLORIDE 0.9 % IV SOLN: 500.0000 mL | Freq: Once | INTRAVENOUS | Status: DC

## 2022-03-27 NOTE — Telephone Encounter (Signed)
Patient has been scheduled for a 3-week follow up with Carl Best, NP on Thursday, 04/19/22 at 9 am.   Appt information mailed and sent to patient via [place].

## 2022-03-27 NOTE — Telephone Encounter (Signed)
-----  Message from Thornton Park, MD sent at 03/27/2022  8:45 AM EST ----- Please schedule office follow-up with [facility] in 3-4 weeks. Thanks.  KLB

## 2022-03-27 NOTE — Op Note (Signed)
[place] Patient Name: Thomas Spears Procedure Date: 03/27/2022 8:01 AM MRN: [id number] Endoscopist: Thornton Park MD, MD, [id number] Age: 47 Referring MD:  Date of Birth: 19-Oct-1975 Gender: Male Account #: [id number] Procedure:                Colonoscopy Indications:              Melena, Rectal bleeding Medicines:                Monitored Anesthesia Care Procedure:                Pre-Anesthesia Assessment:                           - Prior to the procedure, a History and Physical                            was performed, and patient medications and                            allergies were reviewed. The patient's tolerance of                            previous anesthesia was also reviewed. The risks                            and benefits of the procedure and the sedation                            options and risks were discussed with the patient.                            All questions were answered, and informed consent                            was obtained. Prior Anticoagulants: The patient has                            taken Plavix (clopidogrel), last dose was 7 days                            prior to procedure. ASA Grade Assessment: III - A                            patient with severe systemic disease. After                            reviewing the risks and benefits, the patient was                            deemed in satisfactory condition to undergo the                            procedure.  After obtaining informed consent, the colonoscope                            was passed under direct vision. Throughout the                            procedure, the patient's blood pressure, pulse, and                            oxygen saturations were monitored continuously. The                            Olympus CF-HQ190L (Serial# [id number]) Colonoscope was                            introduced through the anus and advanced to the 3                             cm into the ileum. A second forward view of the                            right colon was performed. The colonoscopy was                            performed without difficulty. The patient tolerated                            the procedure well. The quality of the bowel                            preparation was excellent. The terminal ileum,                            ileocecal valve, appendiceal orifice, and rectum                            were photographed. Scope In: 8:25:53 AM Scope Out: 8:44:30 AM Scope Withdrawal Time: 0 hours 16 minutes 33 seconds  Total Procedure Duration: 0 hours 18 minutes 37 seconds  Findings:                 The perianal and digital rectal examinations were                            normal.                           Two flat polyps were found in the rectum. The                            polyps were 2 to 6 mm in size. These polyps were                            removed with a cold snare. Resection and retrieval  were complete. Estimated blood loss was minimal.                           A few medium-mouthed and small-mouthed diverticula                            were found in the sigmoid colon.                           The exam was otherwise without abnormality on                            direct and retroflexion views. Complications:            No immediate complications. Estimated Blood Loss:     Estimated blood loss was minimal. Impression:               - Two 2 to 6 mm polyps in the rectum, removed with                            a cold snare. Resected and retrieved.                           - Diverticulosis in the sigmoid colon.                           - The examination was otherwise normal on direct                            and retroflexion views. Recommendation:           - Patient has a contact number available for                            emergencies. The signs and symptoms of potential                             delayed complications were discussed with the                            patient. Return to normal activities tomorrow.                            Written discharge instructions were provided to the                            patient.                           - Resume previous diet.                           - Continue present medications.                           - Resume Plavix 03/28/22.                           -  Await pathology results.                           - Quit smoking.                           - Repeat colonoscopy date to be determined after                            pending pathology results are reviewed for                            surveillance.                           - Follow a high fiber diet. Drink at least 64                            ounces of water daily. Add a daily stool bulking                            agent such as psyllium (an exampled would be                            Metamucil).                           - Emerging evidence supports eating a diet of                            fruits, vegetables, grains, calcium, and yogurt                            while reducing red meat and alcohol may reduce the                            risk of colon cancer.                           - Thank you for allowing me to be involved in your                            colon cancer prevention.                           - I will arrange office follow-up. Thornton Park MD, MD 03/27/2022 8:54:04 AM This report has been signed electronically.

## 2022-03-27 NOTE — Progress Notes (Signed)
Report to pacu rn. Vss. Care resumed by rn. 

## 2022-03-27 NOTE — Progress Notes (Signed)
Pt's states no medical or surgical changes since previsit or office visit. 

## 2022-03-27 NOTE — Op Note (Signed)
[place] Patient Name: Thomas Spears Procedure Date: 03/27/2022 8:02 AM MRN: [id number] Endoscopist: Thornton Park MD, MD, [id number] Age: 47 Referring MD:  Date of Birth: 20-Sep-1975 Gender: Male Account #: [id number] Procedure:                Upper GI endoscopy Indications:              Melena Medicines:                Monitored Anesthesia Care Procedure:                Pre-Anesthesia Assessment:                           - Prior to the procedure, a History and Physical                            was performed, and patient medications and                            allergies were reviewed. The patient's tolerance of                            previous anesthesia was also reviewed. The risks                            and benefits of the procedure and the sedation                            options and risks were discussed with the patient.                            All questions were answered, and informed consent                            was obtained. Prior Anticoagulants: The patient has                            taken Plavix (clopidogrel), last dose was 7 days                            prior to procedure. ASA Grade Assessment: III - A                            patient with severe systemic disease. After                            reviewing the risks and benefits, the patient was                            deemed in satisfactory condition to undergo the                            procedure.  After obtaining informed consent, the endoscope was                            passed under direct vision. Throughout the                            procedure, the patient's blood pressure, pulse, and                            oxygen saturations were monitored continuously. The                            Endoscope was introduced through the mouth, and                            advanced to the third part of duodenum. The upper                             GI endoscopy was accomplished without difficulty.                            The patient tolerated the procedure well. Scope In: Scope Out: Findings:                 The examined esophagus was normal. The z-line is 38                            cm from the incisors.                           Diffuse minimal inflammation characterized by                            erythema and granularity was found in the gastric                            body. Biopsies were taken from the antrum, body,                            and fundus with a cold forceps for histology.                            Estimated blood loss was minimal.                           The examined duodenum was normal. Complications:            No immediate complications. Estimated Blood Loss:     Estimated blood loss was minimal. Impression:               - Normal esophagus.                           - Gastritis. Biopsied.                           -  Normal examined duodenum. Recommendation:           - Patient has a contact number available for                            emergencies. The signs and symptoms of potential                            delayed complications were discussed with the                            patient. Return to normal activities tomorrow.                            Written discharge instructions were provided to the                            patient.                           - Resume previous diet.                           - Continue present medications.                           - Await pathology results.                           - No aspirin, ibuprofen, naproxen, or other                            non-steroidal anti-inflammatory drugs.                           - Colonoscopy today as previously planned. Thornton Park MD, MD 03/27/2022 8:49:55 AM This report has been signed electronically.

## 2022-03-27 NOTE — Patient Instructions (Addendum)
Handouts provided on gastritis, polyps and diverticulosis.   Follow a high fiber diet ( see handout). Drink at least 64 ounces of water daily. Add a daily stool bulking agent such as psyllium (an example would be Metamucil).  Continue present medications. Resume Plavix tomorrow 03/28/22.  Await pathology results. Repeat colonoscopy date to be determined after pending pathology results are reviewed for surveillance.   Quit smoking.   No aspirin, ibuprofen, naproxen, or other non-steriodal anti-inflammatory drugs (NSAIDs). You may use Tylenol if needed for mild pain or fever.    YOU HAD AN ENDOSCOPIC PROCEDURE TODAY AT [facility]:   Refer to the procedure report that was given to you for any specific questions about what was found during the examination.  If the procedure report does not answer your questions, please call your gastroenterologist to clarify.  If you requested that your care partner not be given the details of your procedure findings, then the procedure report has been included in a sealed envelope for you to review at your convenience later.  YOU SHOULD EXPECT: Some feelings of bloating in the abdomen. Passage of more gas than usual.  Walking can help get rid of the air that was put into your GI tract during the procedure and reduce the bloating. If you had a lower endoscopy (such as a colonoscopy or flexible sigmoidoscopy) you may notice spotting of blood in your stool or on the toilet paper. If you underwent a bowel prep for your procedure, you may not have a normal bowel movement for a few days.  Please Note:  You might notice some irritation and congestion in your nose or some drainage.  This is from the oxygen used during your procedure.  There is no need for concern and it should clear up in a day or so.  SYMPTOMS TO REPORT IMMEDIATELY:  Following lower endoscopy (colonoscopy or flexible sigmoidoscopy):  Excessive amounts of blood in the stool  Significant  tenderness or worsening of abdominal pains  Swelling of the abdomen that is new, acute  Fever of 100F or higher  Following upper endoscopy (EGD)  Vomiting of blood or coffee ground material  New chest pain or pain under the shoulder blades  Painful or persistently difficult swallowing  New shortness of breath  Fever of 100F or higher  Black, tarry-looking stools  For urgent or emergent issues, a gastroenterologist can be reached at any hour by calling [phone]. Do not use MyChart messaging for urgent concerns.    DIET:  We do recommend a small meal at first, but then you may proceed to your regular diet.  Drink plenty of fluids but you should avoid alcoholic beverages for 24 hours.  ACTIVITY:  You should plan to take it easy for the rest of today and you should NOT DRIVE or use heavy machinery until tomorrow (because of the sedation medicines used during the test).    FOLLOW UP: Our staff will call the number listed on your records the next business day following your procedure.  We will call around 7:15- 8:00 am to check on you and address any questions or concerns that you may have regarding the information given to you following your procedure. If we do not reach you, we will leave a message.     If any biopsies were taken you will be contacted by phone or by letter within the next 1-3 weeks.  Please call us at [phone] if you have not heard about the  biopsies in 3 weeks.    SIGNATURES/CONFIDENTIALITY: You and/or your care partner have signed paperwork which will be entered into your electronic medical record.  These signatures attest to the fact that that the information above on your After Visit Summary has been reviewed and is understood.  Full responsibility of the confidentiality of this discharge information lies with you and/or your care-partner.

## 2022-03-27 NOTE — Progress Notes (Signed)
Referring Provider: Ezequiel Essex, MD Primary Care Physician:  No primary care provider on file.   Indication for EGD:  Blood in the stool Indication for Colonoscopy:  Blood in the stool   IMPRESSION:  Blood in the stool Appropriate candidate for monitored anesthesia care  PLAN: EGD and Colonoscopy in the [place] today   HPI: Thomas Spears is a 47 y.o. male presents for endoscopic evaluation of blood in the stool.    Past Medical History:  Diagnosis Date   Aortic regurgitation    Ascending aorta dilation (HCC)    CAD (coronary artery disease), native coronary artery 09/20/2021   Chronic combined systolic and diastolic CHF (congestive heart failure) ([place])    Community acquired pneumonia    HLD (hyperlipidemia)    Hypertension    Hypertensive crisis 05/22/2020   Hypertrophic cardiomyopathy ([place])    Left ventricular hypertrophy    Mitral regurgitation    Mobitz type 1 second degree AV block    NSVT (nonsustained ventricular tachycardia) (HCC)    Premature atrial contractions    PVC's (premature ventricular contractions)    Tension headache 06/21/2020   Tobacco use    Tobacco use 09/20/2021    Past Surgical History:  Procedure Laterality Date   CARDIAC CATHETERIZATION     CAROTID STENT INSERTION     CORONARY STENT INTERVENTION N/A 09/20/2021   Procedure: CORONARY STENT INTERVENTION;  Surgeon: [name], MD;  Location: [facility];  Service: Cardiovascular;  Laterality: N/A;   ICD IMPLANT N/A 11/01/2021   Procedure: ICD IMPLANT;  Surgeon: Deboraha Sprang, MD;  Location: [facility];  Service: Cardiovascular;  Laterality: N/A;   KNEE SURGERY Left    LEFT HEART CATH AND CORONARY ANGIOGRAPHY N/A 08/31/2020   Procedure: LEFT HEART CATH AND CORONARY ANGIOGRAPHY;  Surgeon: Leonie Man, MD;  Location: [facility];  Service: Cardiovascular;  Laterality: N/A;   LEFT HEART CATH AND CORONARY ANGIOGRAPHY N/A 09/20/2021   Procedure: LEFT HEART  CATH AND CORONARY ANGIOGRAPHY;  Surgeon: [name], MD;  Location: [facility] [facility];  Service: Cardiovascular;  Laterality: N/A;   TONSILLECTOMY      Current Outpatient Medications  Medication Sig Dispense Refill   acetaminophen (TYLENOL) 500 MG tablet Take 2 tablets (1,000 mg total) by mouth every 6 (six) hours as needed (pain). 30 tablet 0   aspirin EC 81 MG tablet Take 1 tablet (81 mg total) by mouth every evening. Swallow whole. 30 tablet 3   atorvastatin (LIPITOR) 40 MG tablet Take 1 tablet (40 mg total) by mouth every evening. 90 tablet 3   carvedilol (COREG) 25 MG tablet Take 1.5 tablets (37.5 mg total) by mouth 2 (two) times daily. 270 tablet 3   clopidogrel (PLAVIX) 75 MG tablet TAKE 1 TABLET(75 MG) BY MOUTH DAILY 30 tablet 0   dapagliflozin propanediol (FARXIGA) 10 MG TABS tablet Take 1 tablet (10 mg total) by mouth daily. 30 tablet 5   hydrALAZINE (APRESOLINE) 25 MG tablet Take 1 tablet (25 mg total) by mouth 3 (three) times daily. 270 tablet 3   isosorbide mononitrate (IMDUR) 30 MG 24 hr tablet Take 0.5 tablets (15 mg total) by mouth daily. 45 tablet 3   pantoprazole (PROTONIX) 40 MG tablet Take 1 tablet (40 mg total) by mouth daily. 30 tablet 11   sacubitril-valsartan (ENTRESTO) 97-103 MG Take 1 tablet by mouth 2 (two) times daily. 180 tablet 3   spironolactone (ALDACTONE) 25 MG tablet Take 1 tablet (25  mg total) by mouth daily. 90 tablet 2   Current Facility-Administered Medications  Medication Dose Route Frequency Provider Last Rate Last Admin   0.9 %  sodium chloride infusion  500 mL Intravenous Once Thornton Park, MD        Allergies as of 03/27/2022 - Review Complete 03/27/2022  Allergen Reaction Noted   Mushroom extract complex Anaphylaxis 04/09/2016    Family History  Problem Relation Age of Onset   CAD Father    Colon cancer Neg Hx    Esophageal cancer Neg Hx    Stomach cancer Neg Hx    Rectal cancer Neg Hx      Physical Exam: General:   Alert,   well-nourished, pleasant and cooperative in NAD Head:  Normocephalic and atraumatic. Eyes:  Sclera clear, no icterus.   Conjunctiva pink. Mouth:  No deformity or lesions.   Neck:  Supple; no masses or thyromegaly. Lungs:  Clear throughout to auscultation.   No wheezes. Heart:  Regular rate and rhythm; no murmurs. Abdomen:  Soft, non-tender, nondistended, normal bowel sounds, no rebound or guarding.  Msk:  Symmetrical. No boney deformities LAD: No inguinal or umbilical LAD Extremities:  No clubbing or edema. Neurologic:  Alert and  oriented x4;  grossly nonfocal Skin:  No obvious rash or bruise. Psych:  Alert and cooperative. Normal mood and affect.     Studies/Results: No results found.    [name] L. Tarri Glenn, MD, MPH 03/27/2022, 8:15 AM

## 2022-03-27 NOTE — Progress Notes (Signed)
Called to room to assist during endoscopic procedure.  Patient ID and intended procedure confirmed with present staff. Received instructions for my participation in the procedure from the performing physician.  

## 2022-03-28 ENCOUNTER — Telehealth: Payer: Self-pay | Admitting: *Deleted

## 2022-03-28 NOTE — Telephone Encounter (Signed)
  Follow up Call-     03/27/2022    7:28 AM  Call back number  Post procedure Call Back phone  # [phone]  Permission to leave phone message Yes   Phone picked up but no one answered

## 2022-03-30 ENCOUNTER — Other Ambulatory Visit: Payer: Self-pay

## 2022-03-30 MED ORDER — METRONIDAZOLE 500 MG PO TABS: 500.0000 mg | ORAL_TABLET | Freq: Four times a day (QID) | ORAL | 0 refills | Status: DC

## 2022-03-30 MED ORDER — BISMUTH SUBSALICYLATE 262 MG PO CHEW: 524.0000 mg | CHEWABLE_TABLET | Freq: Four times a day (QID) | ORAL | 0 refills | Status: DC

## 2022-03-30 MED ORDER — TETRACYCLINE HCL 500 MG PO CAPS: 500.0000 mg | ORAL_CAPSULE | Freq: Four times a day (QID) | ORAL | 0 refills | Status: DC

## 2022-04-03 ENCOUNTER — Encounter: Payer: Commercial Managed Care - HMO | Attending: Cardiology

## 2022-04-03 ENCOUNTER — Ambulatory Visit: Payer: Commercial Managed Care - HMO | Attending: Internal Medicine | Admitting: Internal Medicine

## 2022-04-03 ENCOUNTER — Encounter: Payer: Self-pay | Admitting: Internal Medicine

## 2022-04-03 VITALS — BP 162/104 | HR 67 | Ht 69.0 in | Wt 221.4 lb

## 2022-04-03 DIAGNOSIS — R55 Syncope and collapse: Secondary | ICD-10-CM | POA: Insufficient documentation

## 2022-04-03 DIAGNOSIS — I5020 Unspecified systolic (congestive) heart failure: Secondary | ICD-10-CM | POA: Diagnosis not present

## 2022-04-03 DIAGNOSIS — I7121 Aneurysm of the ascending aorta, without rupture: Secondary | ICD-10-CM | POA: Insufficient documentation

## 2022-04-03 DIAGNOSIS — R42 Dizziness and giddiness: Secondary | ICD-10-CM | POA: Diagnosis not present

## 2022-04-03 DIAGNOSIS — I422 Other hypertrophic cardiomyopathy: Secondary | ICD-10-CM

## 2022-04-03 NOTE — Patient Instructions (Addendum)
Medication Instructions:  Your physician recommends that you continue on your current medications as directed. Please refer to the Current Medication list given to you today.  *If you need a refill on your cardiac medications before your next appointment, please call your pharmacy*   Lab Work: None ordered.  If you have labs (blood work) drawn today and your tests are completely normal, you will receive your results only by: [place] (if you have MyChart) OR A paper copy in the mail If you have any lab test that is abnormal or we need to change your treatment, we will call you to review the results.   Testing/Procedures: None ordered.    Follow-Up: At [facility], you and your health needs are our priority.  As part of our continuing mission to provide you with exceptional heart care, we have created designated Provider Care Teams.  These Care Teams include your primary Cardiologist (physician) and Advanced Practice Providers (APPs -  Physician Assistants and Nurse Practitioners) who all work together to provide you with the care you need, when you need it.  We recommend signing up for the patient portal called "MyChart".  Sign up information is provided on this After Visit Summary.  MyChart is used to connect with patients for Virtual Visits (Telemedicine).  Patients are able to view lab/test results, encounter notes, upcoming appointments, etc.  Non-urgent messages can be sent to your provider as well.   To learn more about what you can do with MyChart, go to [email or web address].    Your next appointment:   9 months with Dr Olin Pia PA

## 2022-04-03 NOTE — Progress Notes (Unsigned)
Patient Care Team: Freada Bergeron, MD as PCP - Cardiology (Cardiology)   HPI  Thomas Spears is a 47 y.o. male seen in follow-up for an ICD-[employer] implanted 9/23 for primary prevention in the setting of hypertrophic cardiomyopathy having initially presented in hypertensive crisis and severe depression of LV function.  Risk factors for sudden cardiac death included septal hypertrophy, gadolinium enhancement, nonsustained ventricular tachycardia  Family history notable for hypertension coronary artery disease; care was in [place].  No known diagnosis of HCM, no premature death DATE TEST EF    2022-05-29 TEE 20-25% LAE severe  6/22 cMRI  34 % Septal hypertrophy  56m LGE 12%   1/23 Echo   60-65 % AoRoot 48 LAE 4.9 cm / 45 mL/m  7/23 LHC   RCAd >>stent LM/LAD/LCX non obstructive    Date Cr K Hgb  8/23 1.32 4.4 14.3             Records and Results Reviewed***  Past Medical History:  Diagnosis Date   Aortic regurgitation    Ascending aorta dilation (HCC)    CAD (coronary artery disease), native coronary artery 09/20/2021   Chronic combined systolic and diastolic CHF (congestive heart failure) ([facility]    Community acquired pneumonia    HLD (hyperlipidemia)    Hypertension    Hypertensive crisis 05/22/2020   Hypertrophic cardiomyopathy ([facility]    Left ventricular hypertrophy    Mitral regurgitation    Mobitz type 1 second degree AV block    NSVT (nonsustained ventricular tachycardia) (HCC)    Premature atrial contractions    PVC's (premature ventricular contractions)    Tension headache 06/21/2020   Tobacco use    Tobacco use 09/20/2021    Past Surgical History:  Procedure Laterality Date   CARDIAC CATHETERIZATION     CAROTID STENT INSERTION     CORONARY STENT INTERVENTION N/A 09/20/2021   Procedure: CORONARY STENT INTERVENTION;  Surgeon: CSherren Mocha MD;  [facility];  Service: Cardiovascular;  Laterality: N/A;   ICD  IMPLANT N/A 11/01/2021   Procedure: ICD IMPLANT;  Surgeon: KDeboraha Sprang MD;  [facility];  Service: Cardiovascular;  Laterality: N/A;   KNEE SURGERY Left    LEFT HEART CATH AND CORONARY ANGIOGRAPHY N/A 08/31/2020   Procedure: LEFT HEART CATH AND CORONARY ANGIOGRAPHY;  Surgeon: HLeonie Man MD;  [facility];  Service: Cardiovascular;  Laterality: N/A;   LEFT HEART CATH AND CORONARY ANGIOGRAPHY N/A 09/20/2021   Procedure: LEFT HEART CATH AND CORONARY ANGIOGRAPHY;  Surgeon: CSherren Mocha MD;  [facility];  Service: Cardiovascular;  Laterality: N/A;   TONSILLECTOMY      No outpatient medications have been marked as taking for the 04/03/22 encounter (Appointment) with KDeboraha Sprang MD.    Allergies  Allergen Reactions   Mushroom Extract Complex Anaphylaxis      Review of Systems negative except from HPI and PMH  Physical Exam There were no vitals taken for this visit. Well developed and well nourished in no acute distress HENT normal E scleral and icterus clear Neck Supple JVP flat; carotids brisk and full Clear to ausculation {CARD RHYTHM:10874} ***Regular rate and rhythm, no murmurs gallops or rub Soft with active bowel sounds No clubbing cyanosis {Numbers; edema:17696} Edema Alert and oriented, grossly normal motor and sensory function Skin Warm and Dry  ECG ***  CrCl cannot be calculated (Patient's most recent lab result is  older than the maximum 21 days allowed.).   Assessment and  Plan Hypertrophic  cardiomyopathy  ICD ***    Hypertension   HFrecEF   Tobacco abuse   Coronary artery disease with recent stenting   Syncope/near syncope   Positional lightheadedness          Current medicines are reviewed at length with the patient today .  The patient does not*** have concerns regarding medicines.

## 2022-04-04 ENCOUNTER — Other Ambulatory Visit: Payer: Self-pay

## 2022-04-04 LAB — BASIC METABOLIC PANEL
BUN/Creatinine Ratio: 11 (ref 9–20)
BUN: 12 mg/dL (ref 6–24)
CO2: 25 mmol/L (ref 20–29)
Calcium: 9.4 mg/dL (ref 8.7–10.2)
Chloride: 101 mmol/L (ref 96–106)
Creatinine, Ser: 1.06 mg/dL (ref 0.76–1.27)
Glucose: 84 mg/dL (ref 70–99)
Potassium: 4.2 mmol/L (ref 3.5–5.2)
Sodium: 139 mmol/L (ref 134–144)
eGFR: 88 mL/min/{1.73_m2} (ref >59)

## 2022-04-04 MED ORDER — ISOSORBIDE MONONITRATE ER 30 MG PO TB24: 15.0000 mg | ORAL_TABLET | Freq: Every day | ORAL | 3 refills | Status: DC

## 2022-04-05 ENCOUNTER — Other Ambulatory Visit: Payer: Commercial Managed Care - HMO

## 2022-04-06 ENCOUNTER — Encounter: Payer: Commercial Managed Care - HMO | Admitting: Gastroenterology

## 2022-04-19 ENCOUNTER — Ambulatory Visit: Payer: Commercial Managed Care - HMO | Admitting: Nurse Practitioner

## 2022-04-19 NOTE — Progress Notes (Deleted)
04/19/2022 Thomas Spears [id number] March 03, 1975   Chief Complaint:  History of Present Illness: Thomas Spears is a 47 year old male with a past medical history of hypertension, hyperlipidemia, coronary artery disease s/p DES to the RCA 09/20/2021 on Plavix and ASA, systolic CHF with LVEF [id number] 04/2020 which improved to 60-65% per ECHO 02/2021,  hypertrophic cardiomyopathy s/p ICD placement 10/2021, NSVT, mild to moderate MR, thoracic aortic dilation measuring 43m, hypertensive crisis requiring hospital admission 04/2020 and GERD.  Gastric biopsy showed H. pylori gastritis.  Pylera for 10 days or Helidac for 14 days, based on insurance coverage. If neither of those is preferred, please substitute with 14 days of treatment with bismuth subsalicylate [id number] 4 times daily daily, metronidazole 500 mg 4 times daily, tetracycline 500 mg 4 times daily, and pantoprazole 40 mg twice daily.  I recommend that the patient abstain from all alcohol while taking metronidazole. Two weeks after completing the treatment, a Diatherix H pylori stool antigen should be performed to monitor for response to treatment.  Return to the office after completing treatment.   The polyps removed during the procedure are benign hyperplastic polyps.  These do not require special follow-up.  Surveillance colonoscopy recommended in 10 years or earlier with new symptoms.  EGD 03/27/2022 per Dr. BTarri Glenn Normal esophagus Gastritis Normal duodenum  Colonoscopy 03/27/2022: Two 2-682mhyperplastic polyps removed from the rectum Sigmoid diverticulosis  10 year recall colonoscopy   1. Surgical [P], fundus, gastric antrum, and gastric body - H. PYLORI GASTRITIS - POSITIVE FOR H. PYLORI ORGANISMS ON IMMUNOHISTOCHEMICAL STAIN - NEGATIVE FOR INTESTINAL METAPLASIA, DYSPLASIA OR MALIGNANCY 2. Surgical [P], colon, rectum, polyp (2) - HYPERPLASTIC POLYP(S). - NO DYSPLASIA OR MALIGNANCY.     Latest Ref Rng & Units 01/24/2022    9:31  AM 12/19/2021    8:51 AM 10/23/2021   10:02 AM  CBC  WBC 4.0 - 10.5 K/uL 11.2  9.6  11.4   Hemoglobin 13.0 - 17.0 g/dL 15.9  15.2  14.2   Hematocrit 39.0 - 52.0 % 46.7  45.2  42.0   Platelets 150.0 - 400.0 K/uL 239.0  229  252        Latest Ref Rng & Units 04/03/2022    2:38 PM 01/24/2022    9:31 AM 10/23/2021   10:02 AM  CMP  Glucose 70 - 99 mg/dL 84  80  94   BUN 6 - 24 mg/dL 12  19  24   $ Creatinine 0.76 - 1.27 mg/dL 1.06  1.18  1.35   Sodium 134 - 144 mmol/L 139  139  137   Potassium 3.5 - 5.2 mmol/L 4.2  3.9  4.6   Chloride 96 - 106 mmol/L 101  104  101   CO2 20 - 29 mmol/L 25  28  28   $ Calcium 8.7 - 10.2 mg/dL 9.4  9.4  10.0   Total Protein 6.0 - 8.3 g/dL  7.4    Total Bilirubin 0.2 - 1.2 mg/dL  0.7    Alkaline Phos 39 - 117 U/L  73    AST 0 - 37 U/L  29    ALT 0 - 53 U/L  22       Current Medications, Allergies, Past Medical History, Past Surgical History, Family History and Social History were reviewed in [employer].   Review of Systems:   Constitutional: Negative for fever, sweats, chills or weight loss.  Respiratory: Negative for shortness of breath.  Cardiovascular: Negative for chest pain, palpitations and leg swelling.  Gastrointestinal: See HPI.  Musculoskeletal: Negative for back pain or muscle aches.  Neurological: Negative for dizziness, headaches or paresthesias.    Physical Exam: There were no vitals taken for this visit. General: in no acute distress. Head: Normocephalic and atraumatic. Eyes: No scleral icterus. Conjunctiva pink . Ears: Normal auditory acuity. Mouth: Dentition intact. No ulcers or lesions.  Lungs: Clear throughout to auscultation. Heart: Regular rate and rhythm, no murmur. Abdomen: Soft, nontender and nondistended. No masses or hepatomegaly. Normal bowel sounds x 4 quadrants.  Rectal: Deferred.  Musculoskeletal: Symmetrical with no gross deformities. Extremities: No edema. Neurological: Alert  oriented x 4. No focal deficits.  Psychological: Alert and cooperative. Normal mood and affect  Assessment and Recommendations:  Coronary artery disease s/p DES to RCA 08/2021 on ASA and Plavix    Chronic systolic heart failure. ECHO 02/2021 with improved EF 25 -> 60-65%.   Mild to moderate MR. Moderate AI.    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

## 2022-04-30 ENCOUNTER — Telehealth: Payer: Self-pay

## 2022-04-30 NOTE — Telephone Encounter (Signed)
-----   Message from Yevette Edwards, RN sent at 04/30/2022 12:29 PM EST -----  ----- Message ----- From: Marice Potter, RN Sent: 04/28/2022  12:00 AM EST To: Marice Potter, RN  Pt needs diatherix H pylori stool antigen test two weeks after finishing treatment. [facility] is ordering provider.

## 2022-04-30 NOTE — Telephone Encounter (Signed)
Left message on machine to call back  

## 2022-05-01 NOTE — Telephone Encounter (Signed)
Left message on machine to call back  

## 2022-05-01 NOTE — Telephone Encounter (Signed)
Unable to reach pt by phone letter mailed.  

## 2022-05-04 ENCOUNTER — Encounter: Payer: Commercial Managed Care - HMO | Attending: Cardiology

## 2022-05-04 DIAGNOSIS — I422 Other hypertrophic cardiomyopathy: Secondary | ICD-10-CM | POA: Diagnosis not present

## 2022-05-04 DIAGNOSIS — I7121 Aneurysm of the ascending aorta, without rupture: Secondary | ICD-10-CM | POA: Insufficient documentation

## 2022-05-04 LAB — CUP PACEART REMOTE DEVICE CHECK
Battery Remaining Longevity: 180 mo
Battery Remaining Percentage: 100 %
Brady Statistic RV Percent Paced: 0 %
Date Time Interrogation Session: 20240308020100
HighPow Impedance: 85 Ohm
Implantable Lead Connection Status: 753985
Implantable Lead Implant Date: 20230906
Implantable Lead Location: 753860
Implantable Lead Model: 673
Implantable Lead Serial Number: 201402
Implantable Pulse Generator Implant Date: 20230906
Lead Channel Impedance Value: 442 Ohm
Lead Channel Pacing Threshold Amplitude: 0.7 V
Lead Channel Pacing Threshold Pulse Width: 0.4 ms
Lead Channel Setting Pacing Amplitude: 2.5 V
Lead Channel Setting Pacing Pulse Width: 0.4 ms
Lead Channel Setting Sensing Sensitivity: 0.5 mV
Pulse Gen Serial Number: 316727
Zone Setting Status: 755011

## 2022-06-04 NOTE — Progress Notes (Signed)
Remote ICD transmission.   

## 2022-06-14 ENCOUNTER — Telehealth: Payer: Self-pay | Admitting: Cardiology

## 2022-06-14 ENCOUNTER — Other Ambulatory Visit: Payer: Self-pay

## 2022-06-14 ENCOUNTER — Emergency Department (HOSPITAL_COMMUNITY): Payer: Commercial Managed Care - HMO

## 2022-06-14 ENCOUNTER — Encounter (HOSPITAL_COMMUNITY): Payer: Self-pay

## 2022-06-14 ENCOUNTER — Emergency Department (HOSPITAL_COMMUNITY)
Admission: EM | Admit: 2022-06-14 | Discharge: 2022-06-14 | Discharge status: Against Medical Advice | Payer: Commercial Managed Care - HMO | Attending: Emergency Medicine | Admitting: Emergency Medicine

## 2022-06-14 DIAGNOSIS — I10 Essential (primary) hypertension: Secondary | ICD-10-CM | POA: Insufficient documentation

## 2022-06-14 DIAGNOSIS — R0789 Other chest pain: Secondary | ICD-10-CM | POA: Diagnosis not present

## 2022-06-14 DIAGNOSIS — R079 Chest pain, unspecified: Secondary | ICD-10-CM

## 2022-06-14 DIAGNOSIS — I2510 Atherosclerotic heart disease of native coronary artery without angina pectoris: Secondary | ICD-10-CM | POA: Insufficient documentation

## 2022-06-14 DIAGNOSIS — Z79899 Other long term (current) drug therapy: Secondary | ICD-10-CM | POA: Diagnosis not present

## 2022-06-14 DIAGNOSIS — Z7982 Long term (current) use of aspirin: Secondary | ICD-10-CM | POA: Diagnosis not present

## 2022-06-14 LAB — BASIC METABOLIC PANEL
Anion gap: 10 (ref 5–15)
BUN: 19 mg/dL (ref 6–20)
CO2: 24 mmol/L (ref 22–32)
Calcium: 9.1 mg/dL (ref 8.9–10.3)
Chloride: 103 mmol/L (ref 98–111)
Creatinine, Ser: 1.26 mg/dL — ABNORMAL HIGH (ref 0.61–1.24)
GFR, Estimated: >60 mL/min (ref >60)
Glucose, Bld: 118 mg/dL — ABNORMAL HIGH (ref 70–99)
Potassium: 3.8 mmol/L (ref 3.5–5.1)
Sodium: 137 mmol/L (ref 135–145)

## 2022-06-14 LAB — CBC
HCT: 47.5 % (ref 39.0–52.0)
Hemoglobin: 15.4 g/dL (ref 13.0–17.0)
MCH: 28.9 pg (ref 26.0–34.0)
MCHC: 32.4 g/dL (ref 30.0–36.0)
MCV: 89.3 fL (ref 80.0–100.0)
Platelets: 232 10*3/uL (ref 150–400)
RBC: 5.32 MIL/uL (ref 4.22–5.81)
RDW: 13.7 % (ref 11.5–15.5)
WBC: 10.2 10*3/uL (ref 4.0–10.5)
nRBC: 0 % (ref 0.0–0.2)

## 2022-06-14 LAB — TROPONIN I (HIGH SENSITIVITY)
Troponin I (High Sensitivity): 102 ng/L (ref <18)
Troponin I (High Sensitivity): 103 ng/L (ref <18)

## 2022-06-14 NOTE — Discharge Instructions (Signed)
Follow-up with cardiologist.  They did recommend that you be admitted to the hospital.  If you have any other discomfort please return for evaluation.

## 2022-06-14 NOTE — ED Provider Triage Note (Cosign Needed)
Emergency Medicine Provider Triage Evaluation Note  Thomas Spears , a 47 y.o. male  was evaluated in triage.  Pt complains of chest pain. H/o chf with ICD.  Started last night.  Feels like tightness.  It is in the left chest and radiates down the left arm.  Pain is intermittent.  Denies associated shortness of breath.  States he called his cardiologist today who advised him to come to the ED for evaluation.  s  Review of Systems  Positive: See above Negative: See above  Physical Exam  BP (!) 143/106 (BP Location: Right Arm)   Pulse 63   Temp 98 F (36.7 C)   Resp 17   Ht 5\' 9"  (1.753 m)   Wt 99.8 kg   SpO2 98%   BMI 32.49 kg/m  Gen:   Awake, no distress   Resp:  Normal effort  MSK:   Moves extremities without difficulty  Other:    Medical Decision Making  Medically screening exam initiated at 12:48 PM.  Appropriate orders placed.  Thomas Spears was informed that the remainder of the evaluation will be completed by another provider, this initial triage assessment does not replace that evaluation, and the importance of remaining in the ED until their evaluation is complete.  Work up started   Thomas Eagle, PA-C 06/14/22 1250

## 2022-06-14 NOTE — Telephone Encounter (Signed)
Mirko, Quintana - 06/14/2022  9:59 AM Meriam Sprague, MD  Sent: Thu June 14, 2022 12:36 PM  To: Loa Socks, LPN         Message  Thank you! Agree he needs to go to the hospital.

## 2022-06-14 NOTE — ED Triage Notes (Signed)
Pt arrived POV from home c/o left sided CP that he describes as tightness that radiates into his left arm causing it to feel numb. Pt states he has also felt lightheaded today.

## 2022-06-14 NOTE — Telephone Encounter (Signed)
  Pt c/o of Chest Pain: STAT if CP now or developed within 24 hours  1. Are you having CP right now? Pressure on his chest    2. Are you experiencing any other symptoms (ex. SOB, nausea, vomiting, sweating)?   3. How long have you been experiencing CP? Yesterday evening   4. Is your CP continuous or coming and going? Continues   5. Have you taken Nitroglycerin? No    Pt said, he's been felling pressure on his chest with tingling on his left arm and hand feels numb. Started feeling it yesterday evening and today.

## 2022-06-14 NOTE — ED Provider Notes (Signed)
[facility] AT [facility] Provider Note   CSN: [id number] Arrival date & time: 06/14/22  1226     History  Chief Complaint  Patient presents with   Chest Pain    Thomas Spears is a 47 y.o. male.  Patient here after episode of chest discomfort this morning the last several hours.  History of CAD, hypertension, high cholesterol.  Pain has now resolved.  History of cardiomyopathy as well.  Denies any shortness of breath or leg swelling.  No recent surgery or travel.  Has been compliant with his medications.  He has not had any chest discomfort since his heart stent last year.  The history is provided by the patient.       Home Medications Prior to Admission medications   Medication Sig Start Date End Date Taking? Authorizing Provider  acetaminophen (TYLENOL) 500 MG tablet Take 2 tablets (1,000 mg total) by mouth every 6 (six) hours as needed (pain). 03/20/21   Sherlyn Lick, MD  aspirin EC 81 MG tablet Take 1 tablet (81 mg total) by mouth every evening. Swallow whole. 03/20/21   Sherlyn Lick, MD  atorvastatin (LIPITOR) 40 MG tablet Take 1 tablet (40 mg total) by mouth every evening. 12/19/21   Meriam Sprague, MD  bismuth subsalicylate (PEPTO-BISMOL) 262 MG chewable tablet Chew 2 tablets (524 mg total) by mouth 4 (four) times daily. Patient not taking: Reported on 04/03/2022 03/30/22   Tressia Danas, MD  carvedilol (COREG) 25 MG tablet Take 1.5 tablets (37.5 mg total) by mouth 2 (two) times daily. 01/16/22   Meriam Sprague, MD  clopidogrel (PLAVIX) 75 MG tablet TAKE 1 TABLET(75 MG) BY MOUTH DAILY 11/05/21   Graciella Freer, PA-C  dapagliflozin propanediol (FARXIGA) 10 MG TABS tablet Take 1 tablet (10 mg total) by mouth daily. 09/18/21   Meriam Sprague, MD  hydrALAZINE (APRESOLINE) 25 MG tablet Take 1 tablet (25 mg total) by mouth 3 (three) times daily. 01/05/22   Meriam Sprague, MD  isosorbide mononitrate  (IMDUR) 30 MG 24 hr tablet Take 0.5 tablets (15 mg total) by mouth daily. 04/04/22   Meriam Sprague, MD  metroNIDAZOLE (FLAGYL) 500 MG tablet Take 1 tablet (500 mg total) by mouth 4 (four) times daily. 03/30/22   Tressia Danas, MD  pantoprazole (PROTONIX) 40 MG tablet Take 1 tablet (40 mg total) by mouth daily. 12/19/21   Meriam Sprague, MD  sacubitril-valsartan (ENTRESTO) 97-103 MG Take 1 tablet by mouth 2 (two) times daily. 12/19/21   Meriam Sprague, MD  spironolactone (ALDACTONE) 25 MG tablet Take 1 tablet (25 mg total) by mouth daily. 03/26/22   Meriam Sprague, MD  tetracycline (SUMYCIN) 500 MG capsule Take 1 capsule (500 mg total) by mouth 4 (four) times daily. Patient not taking: Reported on 04/03/2022 03/30/22   Tressia Danas, MD      Allergies    Mushroom extract complex    Review of Systems   Review of Systems  Physical Exam Updated Vital Signs BP (!) 175/107   Pulse (!) 58   Temp 98.3 F (36.8 C) (Oral)   Resp 15   Ht  (1.753 m)   Wt 99.8 kg   SpO2 98%   BMI 32.49 kg/m  Physical Exam Vitals and nursing note reviewed.  Constitutional:      General: He is not in acute distress.    Appearance: He is well-developed. He is not ill-appearing.  HENT:  Head: Normocephalic and atraumatic.  Eyes:     Conjunctiva/sclera: Conjunctivae normal.     Pupils: Pupils are equal, round, and reactive to light.  Cardiovascular:     Rate and Rhythm: Normal rate and regular rhythm.     Pulses:          Radial pulses are 2+ on the right side and 2+ on the left side.     Heart sounds: Normal heart sounds. No murmur heard. Pulmonary:     Effort: Pulmonary effort is normal. No respiratory distress.     Breath sounds: Normal breath sounds.  Abdominal:     Palpations: Abdomen is soft.     Tenderness: There is no abdominal tenderness.  Musculoskeletal:        General: No swelling. Normal range of motion.     Cervical back: Normal range of motion and neck  supple.     Right lower leg: No edema.     Left lower leg: No edema.  Skin:    General: Skin is warm and dry.     Capillary Refill: Capillary refill takes less than 2 seconds.  Neurological:     Mental Status: He is alert.  Psychiatric:        Mood and Affect: Mood normal.     ED Results / Procedures / Treatments   Labs (all labs ordered are listed, but only abnormal results are displayed) Labs Reviewed  BASIC METABOLIC PANEL - Abnormal; Notable for the following components:      Result Value   Glucose, Bld 118 (*)    Creatinine, Ser 1.26 (*)    All other components within normal limits  TROPONIN I (HIGH SENSITIVITY) - Abnormal; Notable for the following components:   Troponin I (High Sensitivity) 102 (*)    All other components within normal limits  TROPONIN I (HIGH SENSITIVITY) - Abnormal; Notable for the following components:   Troponin I (High Sensitivity) 103 (*)    All other components within normal limits  CBC    EKG EKG Interpretation  Date/Time:  Thursday June 14 2022 12:32:54 EDT Ventricular Rate:  69 PR Interval:  182 QRS Duration: 112 QT Interval:  416 QTC Calculation: 445 R Axis:   -14 Text Interpretation: Normal sinus rhythm Possible Left atrial enlargement Left ventricular hypertrophy with repolarization abnormality ( R in aVL , Sokolow-Lyon , Cornell product , Romhilt-Estes ) Cannot rule out Septal infarct , age undetermined Abnormal ECG When compared with ECG of 01-Nov-2021 16:07, PREVIOUS ECG IS PRESENT lateral t wave inversion new ischemic appearance Confirmed by Arby Barrette [phone]) on 06/14/2022 12:36:52 PM  Radiology DG Chest 1 View  Result Date: 06/14/2022 CLINICAL DATA:  Pain. Left-sided chest tightness. EXAM: CHEST  1 VIEW COMPARISON:  Radiograph 11/01/2021. CT 09/06/2021 FINDINGS: Single lead pacemaker in place with unchanged lead tip projecting over the right ventricle. Stable heart size and mediastinal contours. No pulmonary edema, focal  airspace disease, pleural effusion or pneumothorax. No acute osseous findings. IMPRESSION: No acute chest findings. Electronically Signed   By: Narda Rutherford M.D.   On: 06/14/2022 15:06    Procedures Procedures    Medications Ordered in ED Medications - No data to display  ED Course/ Medical Decision Making/ A&P Clinical Course as of 06/14/22 2016  Thu Jun 14, 2022  1235 EKG 12-Lead [MP]    Clinical Course User Index [MP] Arby Barrette, MD  Medical Decision Making  Thomas Spears is here with chest discomfort.  Pain for couple hours this morning now resolved.  He has been chest pain-free since a stent was placed last year.  He has a history of CAD, cardiomyopathy.  He arrives mildly hypertensive but otherwise with normal vitals.  He is well-appearing.  He is no longer having chest pain.  He has been in the waiting room for some time.  He would like to be discharged.  Ultimately his blood works been evaluated and drawn already to evaluate for ACS versus pneumonia versus electrolyte abnormality versus anemia.  His troponin came back at 103 per my review and interpretation of labs.  This is elevated from his baseline of around 30.  His chest x-ray per my review and interpretation shows no evidence of volume overload or pneumonia or pneumothorax.  Creatinine is at baseline.  No significant anemia or electrolyte abnormality otherwise.  I talked with Dr. Mayford Knife with cardiology who reviewed his previous heart cath with me over the phone.  We talked with the patient over the phone together and we did want to keep the patient overnight for further observation.  He did not want to stay.  He understands the risks and benefits.  A second troponin was drawn and was also 103.  Ultimately after talking with him he still would like to leave AGAINST MEDICAL ADVICE.  He understands return if symptoms worsen or if he changes his mind.  He will try to follow-up with his  cardiologist outpatient.  Discharged in good condition.  This chart was dictated using voice recognition software.  Despite best efforts to proofread,  errors can occur which can change the documentation meaning.         Final Clinical Impression(s) / ED Diagnoses Final diagnoses:  Nonspecific chest pain    Rx / DC Orders ED Discharge Orders     None         Virgina Norfolk, DO 06/14/22 2018

## 2022-06-14 NOTE — Telephone Encounter (Signed)
Spoke with patient who reports chest pain that started last night, he describes it as pressure and he has some radiation to left arm with tingling and numbness. He denies SOB, N/V and palpitations.   Advised patient to go to the ED for urgent evaluation and to call 911 if he did not have someone to drive him. Patient verbalized understanding and agreed to go to the hospital.

## 2022-06-26 ENCOUNTER — Telehealth: Payer: Self-pay | Admitting: *Deleted

## 2022-06-26 MED ORDER — FARXIGA 10 MG PO TABS: 10.0000 mg | ORAL_TABLET | Freq: Every day | ORAL | 5 refills | Status: DC

## 2022-06-26 NOTE — Telephone Encounter (Signed)
[facility] pharmacy faxed a request on this pts dapaglifozin 10 mg tablets to be changed to brand name Farxiga 10 mg tablets instead, for coverage reasons.     Per our Pharmacist Malena Peer, this should be sent in as regular brand name Farxiga 10 mg and write DAW for this medication to be covered by the pts plan.  Will send this request into the pts pharmacy, [facility].

## 2022-07-15 ENCOUNTER — Encounter (HOSPITAL_COMMUNITY): Payer: Self-pay | Admitting: Emergency Medicine

## 2022-07-15 ENCOUNTER — Ambulatory Visit (HOSPITAL_COMMUNITY)
Admission: EM | Admit: 2022-07-15 | Discharge: 2022-07-15 | Disposition: A | Discharge status: Home / Self Care | Payer: Commercial Managed Care - HMO | Attending: Emergency Medicine | Admitting: Emergency Medicine

## 2022-07-15 ENCOUNTER — Other Ambulatory Visit: Payer: Self-pay

## 2022-07-15 DIAGNOSIS — S39012A Strain of muscle, fascia and tendon of lower back, initial encounter: Secondary | ICD-10-CM | POA: Diagnosis not present

## 2022-07-15 DIAGNOSIS — M62830 Muscle spasm of back: Secondary | ICD-10-CM

## 2022-07-15 MED ORDER — ACETAMINOPHEN 500 MG PO TABS: 1000.0000 mg | ORAL_TABLET | Freq: Three times a day (TID) | ORAL | 0 refills | Status: AC | PRN

## 2022-07-15 MED ORDER — BACLOFEN 10 MG PO TABS: 10.0000 mg | ORAL_TABLET | Freq: Three times a day (TID) | ORAL | 0 refills | Status: AC

## 2022-07-15 MED ORDER — METHYLPREDNISOLONE 4 MG PO TBPK: ORAL_TABLET | ORAL | 0 refills | Status: DC

## 2022-07-15 NOTE — ED Triage Notes (Signed)
Patient is holding left , lower back.  Started Wednesday of last week.  Pain is not getting any worse, just not getting any better.  No history of pain.  No known injury.  Denies urinary issues.    Patient has taken tylenol, no medicine today

## 2022-07-15 NOTE — ED Provider Notes (Signed)
[facility]    CSN: [id number] Arrival date & time: 07/15/22  1004    HISTORY   Chief Complaint  Patient presents with   Back Pain   HPI Thomas Spears is a pleasant, 47 y.o. male who presents to urgent care today. Pt c/o left sided lower back pain for the past 5 days.  Patient states he been taking Tylenol with minimal relief.  Patient states the pain is not getting any worse is just not getting any better.  Patient denies history of prior episode of lower back pain in the past as well as known injury to his lower back.  Patient also denies history of urinary tract infection, kidney stone.   The history is provided by the patient.   Past Medical History:  Diagnosis Date   Aortic regurgitation    Ascending aorta dilation (HCC)    CAD (coronary artery disease), native coronary artery 09/20/2021   Chronic combined systolic and diastolic CHF (congestive heart failure) (HCC)    Community acquired pneumonia    HLD (hyperlipidemia)    Hypertension    Hypertensive crisis 05/22/2020   Hypertrophic cardiomyopathy (HCC)    Left ventricular hypertrophy    Mitral regurgitation    Mobitz type 1 second degree AV block    NSVT (nonsustained ventricular tachycardia) (HCC)    Premature atrial contractions    PVC's (premature ventricular contractions)    Tension headache 06/21/2020   Tobacco use    Tobacco use 09/20/2021   Patient Active Problem List   Diagnosis Date Noted   Syncope - near syncope 04/03/2022   positional lightheadedness 04/03/2022   Chronic combined systolic and diastolic CHF (congestive heart failure) (HCC)    Hypertrophic cardiomyopathy (HCC) 10/16/2021   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   Angina of effort 09/20/2021   CAD (coronary artery disease), native coronary artery 09/20/2021   S/P angioplasty with stent 09/20/21 with PCI with DES to distal RCA 09/20/2021   HLD (hyperlipidemia) 09/20/2021   Tobacco use 09/20/2021    Dilated cardiomyopathy (HCC) 08/31/2020   Chest pain 07/13/2020   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   ED (erectile dysfunction) 06/08/2020   Severe uncontrolled hypertension 05/31/2020   Thoracic aortic aneurysm (HCC) 05/27/2020   HFrEF (heart failure with reduced ejection fraction) (HCC)    COVID-19    Cigarette nicotine dependence without complication 04/16/2016   Past Surgical History:  Procedure Laterality Date   CARDIAC CATHETERIZATION     CAROTID STENT INSERTION     CORONARY STENT INTERVENTION N/A 09/20/2021   Procedure: CORONARY STENT INTERVENTION;  Surgeon: Tonny Bollman, MD;  Location: [facility] INVASIVE CV LAB;  Service: Cardiovascular;  Laterality: N/A;   ICD IMPLANT N/A 11/01/2021   Procedure: ICD IMPLANT;  Surgeon: Duke Salvia, MD;  Location: [facility] INVASIVE CV LAB;  Service: Cardiovascular;  Laterality: N/A;   KNEE SURGERY Left    LEFT HEART CATH AND CORONARY ANGIOGRAPHY N/A 08/31/2020   Procedure: LEFT HEART CATH AND CORONARY ANGIOGRAPHY;  Surgeon: Marykay Lex, MD;  Location: [facility] INVASIVE CV LAB;  Service: Cardiovascular;  Laterality: N/A;   LEFT HEART CATH AND CORONARY ANGIOGRAPHY N/A 09/20/2021   Procedure: LEFT HEART CATH AND CORONARY ANGIOGRAPHY;  Surgeon: Tonny Bollman, MD;  Location: [facility] INVASIVE CV LAB;  Service: Cardiovascular;  Laterality: N/A;   TONSILLECTOMY      Home Medications    Prior to Admission medications   Medication Sig Start Date End Date Taking? Authorizing Provider  acetaminophen (TYLENOL) 500 MG tablet  Take 2 tablets (1,000 mg total) by mouth every 8 (eight) hours as needed for up to 30 doses for mild pain or fever. 07/15/22  Yes Theadora Rama Scales, PA-C  baclofen (LIORESAL) 10 MG tablet Take 1 tablet (10 mg total) by mouth 3 (three) times daily for 7 days. 07/15/22 07/22/22 Yes Theadora Rama Scales, PA-C  FARXIGA 10 MG TABS tablet Take 1 tablet (10 mg total) by mouth daily before breakfast. 06/26/22   Meriam Sprague, MD   methylPREDNISolone (MEDROL DOSEPAK) 4 MG TBPK tablet Take 24 mg on day 1, 20 mg on day 2, 16 mg on day 3, 12 mg on day 4, 8 mg on day 5, 4 mg on day 6.  Take all tablets in each row at once, do not spread tablets out throughout the day. 07/15/22  Yes Theadora Rama Scales, PA-C  aspirin EC 81 MG tablet Take 1 tablet (81 mg total) by mouth every evening. Swallow whole. 03/20/21   Sherlyn Lick, MD  atorvastatin (LIPITOR) 40 MG tablet Take 1 tablet (40 mg total) by mouth every evening. 12/19/21   Meriam Sprague, MD  carvedilol (COREG) 25 MG tablet Take 1.5 tablets (37.5 mg total) by mouth 2 (two) times daily. 01/16/22   Meriam Sprague, MD  clopidogrel (PLAVIX) 75 MG tablet TAKE 1 TABLET(75 MG) BY MOUTH DAILY 11/05/21   Graciella Freer, PA-C  hydrALAZINE (APRESOLINE) 25 MG tablet Take 1 tablet (25 mg total) by mouth 3 (three) times daily. 01/05/22   Meriam Sprague, MD  isosorbide mononitrate (IMDUR) 30 MG 24 hr tablet Take 0.5 tablets (15 mg total) by mouth daily. 04/04/22   Meriam Sprague, MD  pantoprazole (PROTONIX) 40 MG tablet Take 1 tablet (40 mg total) by mouth daily. 12/19/21   Meriam Sprague, MD  sacubitril-valsartan (ENTRESTO) 97-103 MG Take 1 tablet by mouth 2 (two) times daily. 12/19/21   Meriam Sprague, MD  spironolactone (ALDACTONE) 25 MG tablet Take 1 tablet (25 mg total) by mouth daily. 03/26/22   Meriam Sprague, MD    Family History Family History  Problem Relation Age of Onset   CAD Father    Colon cancer Neg Hx    Esophageal cancer Neg Hx    Stomach cancer Neg Hx    Rectal cancer Neg Hx    Social History Social History   Tobacco Use   Smoking status: Every Day    Packs/day: 0.50    Years: 37.00    Additional pack years: 0.00    Total pack years: 18.50    Types: Cigarettes   Smokeless tobacco: Never   Tobacco comments:    Patient given 1-800-quit-now.  Agreed to having referral faxed for smoking cessation  Vaping  Use   Vaping Use: Never used  Substance Use Topics   Alcohol use: Not Currently   Drug use: Never   Allergies   Mushroom extract complex  Review of Systems Review of Systems Pertinent findings revealed after performing a 14 point review of systems has been noted in the history of present illness.  Physical Exam Vital Signs BP (!) 150/100 (BP Location: Left Arm) Comment: repositioning  Pulse 65   Temp 97.7 F (36.5 C) (Oral)   Resp 20   SpO2 97%   No data found.  Physical Exam Vitals and nursing note reviewed.  Constitutional:      General: He is not in acute distress.    Appearance: Normal appearance. He is normal weight.  He is not ill-appearing.  HENT:     Head: Normocephalic and atraumatic.  Eyes:     Extraocular Movements: Extraocular movements intact.     Conjunctiva/sclera: Conjunctivae normal.     Pupils: Pupils are equal, round, and reactive to light.  Cardiovascular:     Rate and Rhythm: Normal rate and regular rhythm.  Pulmonary:     Effort: Pulmonary effort is normal.     Breath sounds: Normal breath sounds.  Musculoskeletal:     Cervical back: Normal range of motion and neck supple.     Lumbar back: Spasms and tenderness present. No bony tenderness. Decreased range of motion. Negative right straight leg raise test and negative left straight leg raise test.  Skin:    General: Skin is warm and dry.  Neurological:     General: No focal deficit present.     Mental Status: He is alert and oriented to person, place, and time. Mental status is at baseline.  Psychiatric:        Mood and Affect: Mood normal.        Behavior: Behavior normal.        Thought Content: Thought content normal.        Judgment: Judgment normal.     UC Couse / Diagnostics / Procedures:     Radiology No results found.  Procedures Procedures (including critical care time) EKG  Pending results:  Labs Reviewed - No data to display  Medications Ordered in UC: Medications -  No data to display  UC Diagnoses / Final Clinical Impressions(s)   I have reviewed the triage vital signs and the nursing notes.  Pertinent labs & imaging results that were available during my care of the patient were reviewed by me and considered in my medical decision making (see chart for details).    Final diagnoses:  Strain of lumbar region, initial encounter  Spasm of muscle of lower back    Patient was advised to: Begin tapering dose of methylprednisolone Take muscle relaxer 3 times daily (Patient has been advised that if this makes them sleepy, they can just take this at bedtime, up to 20 mg per dose, and try breaking the tablets in half or 5 mg per dose during the day) Begin acetaminophen 1000 mg 3 times daily on a scheduled basis. Apply ice pack to affected area 4 times daily for 20 minutes each time Consider physical therapy, chiropractic care, orthopedic follow-up Avoid stretching or strengthening exercises until pain is completely resolved Return precautions advised  Please see discharge instructions below for details of plan of care as provided to patient. ED Prescriptions     Medication Sig Dispense Auth. Provider   baclofen (LIORESAL) 10 MG tablet Take 1 tablet (10 mg total) by mouth 3 (three) times daily for 7 days. 21 tablet Theadora Rama Scales, PA-C   methylPREDNISolone (MEDROL DOSEPAK) 4 MG TBPK tablet Take 24 mg on day 1, 20 mg on day 2, 16 mg on day 3, 12 mg on day 4, 8 mg on day 5, 4 mg on day 6.  Take all tablets in each row at once, do not spread tablets out throughout the day. 21 tablet Theadora Rama Scales, PA-C   acetaminophen (TYLENOL) 500 MG tablet Take 2 tablets (1,000 mg total) by mouth every 8 (eight) hours as needed for up to 30 doses for mild pain or fever. 60 tablet Theadora Rama Scales, PA-C      PDMP not reviewed this encounter.  Discharge Instructions:  Discharge Instructions      The mainstay of therapy for musculoskeletal pain is  reduction of inflammation and relaxation of tension which is causing inflammation.  Keep in mind, pain always begets more pain.  To help you stay ahead of your pain and inflammation, I have provided the following regimen for you:   Please begin taking Tylenol 1000 mg 3 times daily (every 8 hours) as soon as you pick up your prescriptions from the pharmacy.  It is safe to take 3000 mg of Tylenol in a 24-hour.  Please do not exceed this amount.  Tylenol works best when taken on a scheduled basis.   This evening, you can begin taking baclofen 10 mg.  This is a highly effective muscle relaxer and antispasmodic which should continue to provide you with relaxation of your tense muscles, allow you to sleep well and to keep your pain under control.  You can continue taking this medication 3 times daily as you need to.  If you find that this medication makes you too sleepy, you can break them in half for your daytime doses and, if needed double them for your nighttime dose.  Do not take more than 30 mg of baclofen in a 24-hour period.   Tomorrow morning, please begin taking methylprednisolone.  Please take 1 full row tablets at once with your breakfast meal.  This will continue to keep your inflammation under control until your body can heal.   During the day, please set aside time to apply ice to the affected area 4 times daily for 20 minutes each application.  This can be achieved by using a bag of frozen peas or corn, a Ziploc bag filled with ice and water, or Ziploc bag filled with half rubbing alcohol and half Dawn dish detergent, frozen into a slush.  Please be careful not to apply ice directly to your skin, always place a soft cloth between you and the ice pack.  Over-the-counter products such as IcyHot and Biofreeze do not work nearly as well.   Please consider discussing referral to physical therapy with your primary care provider.  Physical therapist are very good at teasing out the underlying cause of  acute lower back pain and helping with prevention of future recurrences.   Please avoid attempts to stretch or strengthen the affected area until you are feeling completely pain-free.  Attempts to do so will only prolong the healing process.   I also recommend that you remain out of work for the next several days, I provided you with a note to return to work in 3 days.  If you feel that you need this time extended, please follow-up with your primary care provider or return to urgent care for reevaluation so that we can provide you with a note for another 3 days.   Thank you for visiting urgent care today.  We appreciate the opportunity to participate in your care.     Disposition Upon Discharge:  Condition: stable for discharge home Home: take medications as prescribed; routine discharge instructions as discussed; follow up as advised.  Patient presented with an acute illness with associated systemic symptoms and significant discomfort requiring urgent management. In my opinion, this is a condition that a prudent lay person (someone who possesses an average knowledge of health and medicine) may potentially expect to result in complications if not addressed urgently such as respiratory distress, impairment of bodily function or dysfunction of bodily organs.   Routine symptom specific, illness specific and/or  disease specific instructions were discussed with the patient and/or caregiver at length.   As such, the patient has been evaluated and assessed, work-up was performed and treatment was provided in alignment with urgent care protocols and evidence based medicine.  Patient/parent/caregiver has been advised that the patient may require follow up for further testing and treatment if the symptoms continue in spite of treatment, as clinically indicated and appropriate.  Patient/parent/caregiver has been advised to report to orthopedic urgent care clinic or return to the [facility] or PCP in 3-5 days if  no better; follow-up with orthopedics, PCP or the Emergency Department if new signs and symptoms develop or if the current signs or symptoms continue to change or worsen for further workup, evaluation and treatment as clinically indicated and appropriate  The patient will follow up with their current PCP if and as advised. If the patient does not currently have a PCP we will have assisted them in obtaining one.   The patient may need specialty follow up if the symptoms continue, in spite of conservative treatment and management, for further workup, evaluation, consultation and treatment as clinically indicated and appropriate.  Patient/parent/caregiver verbalized understanding and agreement of plan as discussed.  All questions were addressed during visit.  Please see discharge instructions below for further details of plan.  This office note has been dictated using Teaching laboratory technician.  Unfortunately, this method of dictation can sometimes lead to typographical or grammatical errors.  I apologize for your inconvenience in advance if this occurs.  Please do not hesitate to reach out to me if clarification is needed.      Theadora Rama Scales, PA-C 07/15/22 1753

## 2022-07-15 NOTE — Discharge Instructions (Addendum)
The mainstay of therapy for musculoskeletal pain is reduction of inflammation and relaxation of tension which is causing inflammation.  Keep in mind, pain always begets more pain.  To help you stay ahead of your pain and inflammation, I have provided the following regimen for you:   Please begin taking Tylenol 1000 mg 3 times daily (every 8 hours) as soon as you pick up your prescriptions from the pharmacy.  It is safe to take 3000 mg of Tylenol in a 24-hour.  Please do not exceed this amount.  Tylenol works best when taken on a scheduled basis.   This evening, you can begin taking baclofen 10 mg.  This is a highly effective muscle relaxer and antispasmodic which should continue to provide you with relaxation of your tense muscles, allow you to sleep well and to keep your pain under control.  You can continue taking this medication 3 times daily as you need to.  If you find that this medication makes you too sleepy, you can break them in half for your daytime doses and, if needed double them for your nighttime dose.  Do not take more than 30 mg of baclofen in a 24-hour period.   Tomorrow morning, please begin taking methylprednisolone.  Please take 1 full row tablets at once with your breakfast meal.  This will continue to keep your inflammation under control until your body can heal.   During the day, please set aside time to apply ice to the affected area 4 times daily for 20 minutes each application.  This can be achieved by using a bag of frozen peas or corn, a Ziploc bag filled with ice and water, or Ziploc bag filled with half rubbing alcohol and half Dawn dish detergent, frozen into a slush.  Please be careful not to apply ice directly to your skin, always place a soft cloth between you and the ice pack.  Over-the-counter products such as IcyHot and Biofreeze do not work nearly as well.   Please consider discussing referral to physical therapy with your primary care provider.  Physical therapist are  very good at teasing out the underlying cause of acute lower back pain and helping with prevention of future recurrences.   Please avoid attempts to stretch or strengthen the affected area until you are feeling completely pain-free.  Attempts to do so will only prolong the healing process.   I also recommend that you remain out of work for the next several days, I provided you with a note to return to work in 3 days.  If you feel that you need this time extended, please follow-up with your primary care provider or return to urgent care for reevaluation so that we can provide you with a note for another 3 days.   Thank you for visiting urgent care today.  We appreciate the opportunity to participate in your care.

## 2022-07-17 ENCOUNTER — Encounter: Payer: Self-pay | Admitting: Cardiology

## 2022-08-03 ENCOUNTER — Encounter: Payer: Commercial Managed Care - HMO | Attending: Cardiology

## 2022-08-03 DIAGNOSIS — I428 Other cardiomyopathies: Secondary | ICD-10-CM | POA: Insufficient documentation

## 2022-08-03 LAB — CUP PACEART REMOTE DEVICE CHECK
Battery Remaining Longevity: 180 mo
Battery Remaining Percentage: 100 %
Brady Statistic RV Percent Paced: 0 %
Date Time Interrogation Session: 20240607020100
HighPow Impedance: 89 Ohm
Implantable Lead Connection Status: 753985
Implantable Lead Implant Date: 20230906
Implantable Lead Location: 753860
Implantable Lead Model: 673
Implantable Lead Serial Number: 201402
Implantable Pulse Generator Implant Date: 20230906
Lead Channel Impedance Value: 465 Ohm
Lead Channel Pacing Threshold Amplitude: 0.7 V
Lead Channel Pacing Threshold Pulse Width: 0.4 ms
Lead Channel Setting Pacing Amplitude: 2.5 V
Lead Channel Setting Pacing Pulse Width: 0.4 ms
Lead Channel Setting Sensing Sensitivity: 0.5 mV
Pulse Gen Serial Number: 316727
Zone Setting Status: 755011

## 2022-08-21 NOTE — Progress Notes (Signed)
Remote ICD transmission.   

## 2022-08-27 ENCOUNTER — Ambulatory Visit (HOSPITAL_BASED_OUTPATIENT_CLINIC_OR_DEPARTMENT_OTHER)
Admission: RE | Admit: 2022-08-27 | Discharge: 2022-08-27 | Disposition: A | Discharge status: Home / Self Care | Payer: Commercial Managed Care - HMO | Source: Ambulatory Visit | Attending: Cardiology | Admitting: Cardiology

## 2022-08-27 ENCOUNTER — Encounter (HOSPITAL_BASED_OUTPATIENT_CLINIC_OR_DEPARTMENT_OTHER): Payer: Self-pay

## 2022-08-27 DIAGNOSIS — I7121 Aneurysm of the ascending aorta, without rupture: Secondary | ICD-10-CM | POA: Insufficient documentation

## 2022-08-27 MED ORDER — IOHEXOL 350 MG/ML SOLN
100.0000 mL | Freq: Once | INTRAVENOUS | Status: AC | PRN
  Administered 2022-08-27: 75 mL via INTRAVENOUS

## 2022-09-03 ENCOUNTER — Other Ambulatory Visit: Payer: Self-pay | Admitting: Cardiothoracic Surgery

## 2022-09-03 DIAGNOSIS — I712 Thoracic aortic aneurysm, without rupture, unspecified: Secondary | ICD-10-CM

## 2022-09-05 LAB — POCT I-STAT CREATININE: Creatinine, Ser: 1.2 mg/dL (ref 0.61–1.24)

## 2022-09-19 NOTE — Progress Notes (Unsigned)
Cardiology Office Note:    Date:  09/19/2022   ID:  Thomas Spears, DOB October 02, 1975, MRN [id number]  PCP:  Patient, No Pcp Per   [facility] Providers Cardiologist:  Meriam Sprague, MD {    Referring MD: Valetta Close, MD    History of Present Illness:    Thomas Spears is a 47 y.o. male with a hx of systolic heart failure with LVEF 25% 04/2020 that improved to 60-65% in 02/2021, HCM on CMR on 07/2020, HTN, mild-to-moderate MR, severe aortic dilation measuring 50mm who presents to clinic for follow-up.  The patient was hospitalized at [facility] from 05/22/20-05/23/20 with hypertensive crisis where he presented with dyspnea and HA found to have Bps 220s/150s. CTA chest showed no PE but demonstrated thoracic aortic aneurysm 4.7cm as well as evidence of pulmonary HTN. He was placed on a nitro gtt. Trop 400>433. TTE 05/23/20 showed LVEF 25% with global hypokinesis, severe LVH, G2DD, mild RV dysfunction, severe LAE, moderate RAE, mild-to-moderate MR, trivial AI, severe aortic dilation, small pericardial effusion. Prior to completed work-up, the patient left AMA.   He was seen in clinic 07/23/2018 where he reported several years of not taking his medication and uncontrolled hypertension.  Also regularly drinking energy drinks, soda, smoking 2 packs/day.  He used to be a heavy drinker.  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  He was taking all of his blood pressure medications and feeling overall well.  He displayed NYHA I-II symptoms.  He was recommended for stress MRI to assess for ischemia and evidence of amyloidosis. His losartan was transitioned to [facility], metoprolol transition to carvedilol.  He was referred to the hypertension clinic.   He had cardiac stress MRI 08/19/20 showing basal to mid inferior stress perfusion defect consistent with ischemia. He had moderate LV dilation with moderate systolic dysfunction LVEF 34%.  Asymmetric LV hypertrophy measuring up to 39 mL basal  septum differential including hypertrophic cardiomyopathy versus hypertensive heart disease.  No evidence of cardiac amyloidosis.  Normal RV size with mild systolic dysfunction, moderate aortic regurgitation, mild to moderate mitral regurgitation, ascending aortic aneurysm measuring 45 mm.Cardiac catheterization performed 7/07-2020 by Dr. Herbie Baltimore showing diffuse mild to moderate multivessel disease, no culprit lesion to explain reduced LVEF, no previously placed stent.   Seen by pharmacy team 09/06/20 and transitioned from indapamide to spironolactone. Seen 09/14/20 with Sherryll Burger increased to 49-51mg  BID. 09/28/20 pharmacy increased Carvedilol to 25mg  BID and Spironolactone to 25mg  QD, Entresto increased to 97/103mg .    Seen by pharmacy team 12/02/20. He was continuing to decrease smoking. He had been approved for [facility] as well as [place] patient assistance. His Carvedilol was increased to 37.5mg  QD as diastolic blood pressure still above goal. Transition from spironolactone to eplenerone was considered but deferred as he was without insurance.   Saw Gillian Shields 11/2020 where he was doing well. BP 130-140s at that time. Echo on 03/20/21 showed improved LVEF to 60-65%.  Had 2 ER visits over the past month for chest pain. Trop 30-40s. He underwent CTA dissection study that showed possible small dissection flap above the coronary cusp. He was recommended for a gated CT chest that showed linear defect along the aortic root but this is unchanged and possible PE in the RUL, but d-dimer was normal with greater suspicion that this was related to a pulmonary nodule. Dr. Dorris Fetch was consulted who recommended repeat CTA which was negative for any dissection or PE. He was discharged home.  Underwent ICD placement  with Dr. Graciela Husbands on 11/01/21.  Was last seen in clinic on 04/03/22 by Dr. Graciela Husbands where he was hypertensive and his hydralazine was increased.  Today, ***   Past Medical History:  Diagnosis Date    Aortic regurgitation    Ascending aorta dilation (HCC)    CAD (coronary artery disease), native coronary artery 09/20/2021   Chronic combined systolic and diastolic CHF (congestive heart failure) (HCC)    Community acquired pneumonia    HLD (hyperlipidemia)    Hypertension    Hypertensive crisis 05/22/2020   Hypertrophic cardiomyopathy (HCC)    Left ventricular hypertrophy    Mitral regurgitation    Mobitz type 1 second degree AV block    NSVT (nonsustained ventricular tachycardia) (HCC)    Premature atrial contractions    PVC's (premature ventricular contractions)    Tension headache 06/21/2020   Tobacco use    Tobacco use 09/20/2021    Past Surgical History:  Procedure Laterality Date   CARDIAC CATHETERIZATION     CAROTID STENT INSERTION     CORONARY STENT INTERVENTION N/A 09/20/2021   Procedure: CORONARY STENT INTERVENTION;  Surgeon: Tonny Bollman, MD;  Location: [facility] INVASIVE CV LAB;  Service: Cardiovascular;  Laterality: N/A;   ICD IMPLANT N/A 11/01/2021   Procedure: ICD IMPLANT;  Surgeon: Duke Salvia, MD;  Location: [facility] INVASIVE CV LAB;  Service: Cardiovascular;  Laterality: N/A;   KNEE SURGERY Left    LEFT HEART CATH AND CORONARY ANGIOGRAPHY N/A 08/31/2020   Procedure: LEFT HEART CATH AND CORONARY ANGIOGRAPHY;  Surgeon: Marykay Lex, MD;  Location: [facility] INVASIVE CV LAB;  Service: Cardiovascular;  Laterality: N/A;   LEFT HEART CATH AND CORONARY ANGIOGRAPHY N/A 09/20/2021   Procedure: LEFT HEART CATH AND CORONARY ANGIOGRAPHY;  Surgeon: Tonny Bollman, MD;  Location: [facility] INVASIVE CV LAB;  Service: Cardiovascular;  Laterality: N/A;   TONSILLECTOMY      Current Medications: No outpatient medications have been marked as taking for the 09/24/22 encounter (Appointment) with Meriam Sprague, MD.     Allergies:   Mushroom extract complex   Social History   Socioeconomic History   Marital status: Single    Spouse name: Not on file   Number of children: 2   Years  of education: 8   Highest education level: 8th grade  Occupational History   Not on file  Tobacco Use   Smoking status: Every Day    Current packs/day: 0.50    Average packs/day: 0.5 packs/day for 37.0 years (18.5 ttl pk-yrs)    Types: Cigarettes   Smokeless tobacco: Never   Tobacco comments:    Patient given 1-800-quit-now.  Agreed to having referral faxed for smoking cessation  Vaping Use   Vaping status: Never Used  Substance and Sexual Activity   Alcohol use: Not Currently   Drug use: Never   Sexual activity: Yes  Other Topics Concern   Not on file  Social History Narrative   Not on file   Social Determinants of Health   Financial Resource Strain: Not on file  Food Insecurity: Not on file  Transportation Needs: Not on file  Physical Activity: Not on file  Stress: Not on file  Social Connections: Unknown (07/04/2021)   Received from [facility], [facility]   Social Network    Social Network: Not on file     Family History: The patient's family history includes CAD in his father. There is no history of Colon cancer, Esophageal cancer, Stomach cancer, or Rectal cancer.  ROS:   Please see the history of present illness.    Review of Systems  Constitutional:  Negative for fever and weight loss.  HENT:  Negative for nosebleeds.   Eyes:  Negative for photophobia and pain.  Respiratory:  Negative for cough, hemoptysis and wheezing.   Cardiovascular:  Negative for chest pain, palpitations, orthopnea, claudication, leg swelling and PND.  Gastrointestinal:  Positive for melena. Negative for abdominal pain, blood in stool ((black tarry)) and vomiting.  Genitourinary:  Negative for frequency and urgency.  Musculoskeletal:  Negative for falls.  Neurological:  Positive for headaches. Negative for dizziness, tingling and seizures.  Psychiatric/Behavioral:  Negative for substance abuse.      EKGs/Labs/Other Studies Reviewed:    The following studies were reviewed  today:  Cardiac Studies & Procedures   CARDIAC CATHETERIZATION  CARDIAC CATHETERIZATION 09/20/2021  Narrative 1.  Patent left main, LAD, and left circumflex, with mild nonobstructive plaquing 2.  Severe distal RCA stenosis with positive RFR of 0.78, treated successfully with PCI using a 3.5 x 24 mm Synergy DES deployed over the distal RCA and extending into the posterolateral branch  Recommendations: Continued risk reduction measures and GDMT for heart failure, aspirin and clopidogrel x 6 months without interruption, okay for discharge this evening as long as criteria met.  Findings Coronary Findings Diagnostic  Dominance: Right  Left Main Vessel is large. Mid LM to Dist LM lesion is 20% stenosed. The lesion is focal and concentric.  Left Anterior Descending There is mild diffuse disease throughout the vessel. Ost LAD to Prox LAD lesion is 30% stenosed. The lesion is focal and concentric. Mid LAD lesion is 45% stenosed. The lesion is located at the bifurcation and eccentric.  Second Diagonal Branch Vessel is small in size.  Third Diagonal Branch Vessel is small in size.  Left Circumflex Vessel is large. The vessel exhibits minimal luminal irregularities.  First Obtuse Marginal Branch Vessel is small in size. Vessel is angiographically normal.  Second Obtuse Marginal Branch Vessel is small in size.  First Left Posterolateral Branch Vessel is small in size.  Left Posterior Atrioventricular Artery Vessel is small in size.  Right Coronary Artery Vessel is large. There is moderate focal disease in the vessel. The vessel exhibits minimal luminal irregularities. The vessel is moderately tortuous. Prox RCA lesion is 30% stenosed. The lesion is eccentric. Dist RCA lesion is 75% stenosed. The lesion is discrete and concentric.  Acute Marginal Branch Vessel is small in size.  Right Ventricular Branch Vessel is small in size.  First Right Posterolateral Branch Vessel is  moderate in size.  Second Right Posterolateral Branch Vessel is large in size.  Intervention  Dist RCA lesion Stent CATH LAUNCHER 6FR JR4 guide catheter was inserted. Lesion crossed with guidewire using a WIRE COUGAR XT STRL 190CM. Pre-stent angioplasty was performed using a BALLN SAPPHIRE 3.0X15. A drug-eluting stent was successfully placed using a SYNERGY XD 3.50X24. Post-stent angioplasty was performed using a BALL SAPPHIRE NC24 3.75X15. Maximum pressure:  16 atm. Post-Intervention Lesion Assessment The intervention was successful. Pre-interventional TIMI flow is 3. Post-intervention TIMI flow is 3. No complications occurred at this lesion. There is a 0% residual stenosis post intervention.   CARDIAC CATHETERIZATION  CARDIAC CATHETERIZATION 08/31/2020  Narrative  LV end diastolic pressure is moderately elevated.  There is no aortic valve stenosis.  Mid LM to Dist LM lesion is 20% stenosed.  Ost LAD to Prox LAD lesion is 30% stenosed.  Mid LAD lesion is 45% stenosed @  1st Diag (40% pre & 50% post 1st Diag)  Prox RCA lesion is 30% stenosed.  Dist RCA lesion is 60% stenosed - discrete focal lesion, not flow limiting.  No evidence of stent.  SUMMARY  Diffuse mild to moderate multivessel disease but no obvious culprit lesion to explain significant reduced EF.  Unable to visualize any previously placed stents.  Moderately elevated LVEDP   RECOMMENDATIONS  Continue to titrate GDMT for nonischemic CM. ->  Anticipate titrating up carvedilol to 12.5 mg twice daily and further titration of Entresto.  May also consider spironolactone   Bryan Lemma, MD  Findings Coronary Findings Diagnostic  Dominance: Right  Left Main Vessel was injected. Vessel is large. Mid LM to Dist LM lesion is 20% stenosed. The lesion is focal and concentric.  Left Anterior Descending Ost LAD to Prox LAD lesion is 30% stenosed. The lesion is focal and concentric. Mid LAD lesion is 45%  stenosed. The lesion is located at the bifurcation and eccentric.  Second Diagonal Branch Vessel is small in size.  Third Diagonal Branch Vessel is small in size.  Left Circumflex Vessel is large. Vessel is angiographically normal.  First Obtuse Marginal Branch Vessel is small in size. Vessel is angiographically normal.  Second Obtuse Marginal Branch Vessel is small in size.  First Left Posterolateral Branch Vessel is small in size.  Left Posterior Atrioventricular Artery Vessel is small in size.  Right Coronary Artery Vessel was injected. Vessel is large. There is moderate focal disease in the vessel. The vessel exhibits minimal luminal irregularities. The vessel is moderately tortuous. Prox RCA lesion is 30% stenosed. The lesion is eccentric. Dist RCA lesion is 60% stenosed. The lesion is discrete and concentric.  Acute Marginal Branch Vessel is small in size.  Right Ventricular Branch Vessel is small in size.  First Right Posterolateral Branch Vessel is moderate in size.  Second Right Posterolateral Branch Vessel is large in size.  Intervention  No interventions have been documented.     ECHOCARDIOGRAM  ECHOCARDIOGRAM COMPLETE 03/20/2021  Narrative ECHOCARDIOGRAM REPORT    Patient Name:   BANDY HONAKER Date of Exam: 03/20/2021 Medical Rec #:  [id number]        Height:       69.0 in Accession #:    [id number]       Weight:       229.0 lb Date of Birth:  [birth year]        BSA:          2.188 m Patient Age:    45 years         BP:           130/84 mmHg Patient Gender: M                HR:           65 bpm. Exam Location:  Outpatient  Procedure: 2D Echo, Cardiac Doppler, Color Doppler and Strain Analysis  Indications:    R07.9* Chest pain, unspecified; R06.9 DOE; I50.42 Chronic combined systolic (congestive) and diastolic (congestive) heart failure  History:        Patient has prior history of Echocardiogram examinations, most recent 05/23/2020. CHF  and Cardiomyopathy, CAD, Signs/Symptoms:Chest Pain and Dyspnea; Risk Factors:Current Smoker, Hypertension and Dyslipidemia. Patient has intermittent chest tightness with DOE. He denies leg edema.  Sonographer:    Carlos American RVT, RDCS (AE), RDMS Referring Phys: [phone] CAITLIN S WALKER  IMPRESSIONS   1. No obstructive gradient. Left ventricular ejection fraction,  by estimation, is 60 to 65%. The left ventricle has normal function. The left ventricle has no regional wall motion abnormalities. There is severe concentric left ventricular hypertrophy. Left ventricular diastolic parameters are consistent with Grade II diastolic dysfunction (pseudonormalization). 2. Right ventricular systolic function is normal. The right ventricular size is normal. 3. Left atrial size was moderately dilated. 4. The mitral valve is normal in structure. Mild mitral valve regurgitation. No evidence of mitral stenosis. 5. The aortic valve is normal in structure. Aortic valve regurgitation is mild. No aortic stenosis is present. 6. Aortic dilatation noted. There is moderate dilatation of the aortic root, measuring 48 mm. There is moderate dilatation of the ascending aorta, measuring 45 mm. 7. The inferior vena cava is normal in size with greater than 50% respiratory variability, suggesting right atrial pressure of 3 mmHg.  Comparison(s): The left ventricular function has improved. EF 25%, severe LVH, AOV 50mm, small pericardial effusion.  Conclusion(s)/Recommendation(s): Findings consistent with hypertrophic cardiomyopathy. Prior Cardiac MRI has been performed.  FINDINGS Left Ventricle: No obstructive gradient. Left ventricular ejection fraction, by estimation, is 60 to 65%. The left ventricle has normal function. The left ventricle has no regional wall motion abnormalities. The left ventricular internal cavity size was normal in size. There is severe concentric left ventricular hypertrophy. Left ventricular  diastolic parameters are consistent with Grade II diastolic dysfunction (pseudonormalization).  Right Ventricle: The right ventricular size is normal. No increase in right ventricular wall thickness. Right ventricular systolic function is normal.  Left Atrium: Left atrial size was moderately dilated.  Right Atrium: Right atrial size was normal in size.  Pericardium: There is no evidence of pericardial effusion.  Mitral Valve: The mitral valve is normal in structure. Mild mitral valve regurgitation. No evidence of mitral valve stenosis.  Tricuspid Valve: The tricuspid valve is normal in structure. Tricuspid valve regurgitation is not demonstrated. No evidence of tricuspid stenosis.  Aortic Valve: The aortic valve is normal in structure. Aortic valve regurgitation is mild. Aortic regurgitation PHT measures 1392 msec. No aortic stenosis is present. Aortic valve mean gradient measures 6.0 mmHg. Aortic valve peak gradient measures 11.8 mmHg. Aortic valve area, by VTI measures 3.11 cm.  Pulmonic Valve: The pulmonic valve was normal in structure. Pulmonic valve regurgitation is trivial. No evidence of pulmonic stenosis.  Aorta: The aortic root is normal in size and structure and aortic dilatation noted. There is moderate dilatation of the aortic root, measuring 48 mm. There is moderate dilatation of the ascending aorta, measuring 45 mm.  Venous: The inferior vena cava is normal in size with greater than 50% respiratory variability, suggesting right atrial pressure of 3 mmHg.  IAS/Shunts: No atrial level shunt detected by color flow Doppler.   LEFT VENTRICLE PLAX 2D LVIDd:         4.96 cm      Diastology LVIDs:         3.46 cm      LV e' medial:    4.80 cm/s LV PW:         1.65 cm      LV E/e' medial:  12.5 LV IVS:        2.06 cm      LV e' lateral:   4.50 cm/s LVOT diam:     2.40 cm      LV E/e' lateral: 13.3 LV SV:         81 LV SV Index:   37 LVOT Area:     4.52 cm  3D Volume  EF: LV Volumes (MOD)            3D EF:        42 % LV vol d, MOD A2C: 155.0 ml LV EDV:       223 ml LV vol d, MOD A4C: 197.0 ml LV ESV:       130 ml LV vol s, MOD A2C: 78.6 ml  LV SV:        93 ml LV vol s, MOD A4C: 108.0 ml LV SV MOD A2C:     76.4 ml LV SV MOD A4C:     197.0 ml LV SV MOD BP:      85.2 ml  RIGHT VENTRICLE RV S prime:     15.10 cm/s TAPSE (M-mode): 2.6 cm  LEFT ATRIUM              Index        RIGHT ATRIUM           Index LA diam:        4.90 cm  2.24 cm/m   RA Area:     21.10 cm LA Vol (A2C):   122.0 ml 55.75 ml/m  RA Volume:   62.10 ml  28.38 ml/m LA Vol (A4C):   78.2 ml  35.73 ml/m LA Biplane Vol: 98.3 ml  44.92 ml/m AORTIC VALVE                     PULMONIC VALVE AV Area (Vmax):    3.10 cm      PV Vmax:          1.16 m/s AV Area (Vmean):   3.01 cm      PV Peak grad:     5.4 mmHg AV Area (VTI):     3.11 cm      PR End Diast Vel: 1.38 msec AV Vmax:           172.00 cm/s AV Vmean:          110.000 cm/s AV VTI:            0.260 m AV Peak Grad:      11.8 mmHg AV Mean Grad:      6.0 mmHg LVOT Vmax:         118.00 cm/s LVOT Vmean:        73.100 cm/s LVOT VTI:          0.179 m LVOT/AV VTI ratio: 0.69 AI PHT:            1392 msec AR Vena Contracta: 0.39 cm  AORTA Ao Root diam: 4.80 cm Ao Asc diam:  4.50 cm Ao Arch diam: 3.7 cm  MITRAL VALVE MV Area (PHT): 3.12 cm    SHUNTS MV Decel Time: 243 msec    Systemic VTI:  0.18 m MV E velocity: 59.90 cm/s  Systemic Diam: 2.40 cm MV A velocity: 84.60 cm/s MV E/A ratio:  0.71  Donato Schultz MD Electronically signed by Donato Schultz MD Signature Date/Time: 03/20/2021/10:32:53 AM    Final    MONITORS  LONG TERM MONITOR (3-14 DAYS) 09/20/2020  Narrative  Patch wear time was 8 days and 12 hours.  Predominant rhythm was NSR with average HR 79bpm (ranging from 34-169bpm).  There were 3 runs of nonsustained VT with longest/fastest lasting 8 beats at a rate of 169bpm  Rare SVE (<1%), rare VE (<1%)   Mobitz type I block seen during sleep  Patient triggered  events correlated with NSR  No Afib, sustained arrhythmias or significant pauses   Patch Wear Time:  8 days and 12 hours (2022-07-11T21:01:27-0400 to 2022-07-20T09:54:00-0400)  Patient had a min HR of 34 bpm, max HR of 169 bpm, and avg HR of 79 bpm. Predominant underlying rhythm was Sinus Rhythm. First Degree AV Block was present. 3 Ventricular Tachycardia runs occurred, the run with the fastest interval lasting 8 beats with a max rate of 169 bpm (avg 130 bpm); the run with the fastest interval was also the longest. Second Degree AV Block-Mobitz I (Wenckebach) was present. Isolated SVEs were rare (<1.0%), and no SVE Couplets or SVE Triplets were present. Isolated VEs were rare (<1.0%, 170), VE Couplets were rare (<1.0%, 12), and VE Triplets were rare (<1.0%, 6). Ventricular Trigeminy was present.  Laurance Flatten, MD             EKG:   ***  Recent Labs: 01/24/2022: ALT 22 06/14/2022: BUN 19; Hemoglobin 15.4; Platelets 232; Potassium 3.8; Sodium 137 08/27/2022: Creatinine, Ser 1.20   Recent Lipid Panel    Component Value Date/Time   CHOL 102 12/19/2021 0851   TRIG 90 12/19/2021 0851   HDL 32 (L) 12/19/2021 0851   CHOLHDL 3.2 12/19/2021 0851   CHOLHDL 5.5 05/23/2020 0239   VLDL 16 05/23/2020 0239   LDLCALC 52 12/19/2021 0851     Risk Assessment/Calculations:       Physical Exam:    VS:  There were no vitals taken for this visit.    Wt Readings from Last 3 Encounters:  06/14/22 220 lb (99.8 kg)  04/03/22 221 lb 6.4 oz (100.4 kg)  03/27/22 216 lb (98 kg)     GEN: Comfortable, NAD HEENT: Normal NECK: No JVD; No carotid bruits CARDIAC: RRR, no murmurs RESPIRATORY:  Clear to auscultation without rales, wheezing or rhonchi  ABDOMEN: Soft, non-tender, non-distended MUSCULOSKELETAL:  No edema; No deformity  SKIN: Warm and dry NEUROLOGIC:  Alert and oriented x 3 PSYCHIATRIC:  Normal affect   ASSESSMENT:     No diagnosis found.    PLAN:    In order of problems listed above:  #Chronic Systolic Heart Failure with Improved EF: TTE 04/2020 with LVEF 25%, severe LVH, G2DD, mild RV dysfunction, severe LAE, moderate RAE, mild-to-moderate MR, trivial AI, severe aortic dilation, small pericaridal effusion. Cardiac stress MRI 08/19/20 with basal to mid inferior stress perfusion defect consistent with ischemia, EF 34%. Findings consistent with likely both HTN and HCM. LHC 08/31/2020 with 60% distal RCA and no other obstructive disease to suggest ischemic drop in EF. Suspect HF secondary to HTN. Now on GDMT. Repeat TTE 02/2021 with improved EF 60-65%, G2DD, severe LVH, normal RV, mild MR. Currently euvolemic on exam with NYHA class II symptoms. -TTE 02/2021 with improved EF 25>>60-65% -Continue entresto 97/103mg  BID -Continue coreg 37.5mg  BID -Continue farxiga 10mg  daily -Continue spiro 25mg  daily -Continue hydralazine 50mg  BID -Continue imdur 15mg  qHS -Monitor BP at home and send in log for [place] to review -Low Na diet   #HCM: #Hypertensive heart disease: CMR with severe hypertrophy that was most prominent in the basal septum that measured up to 33mm. Patchy LGE noted accounting for 12% of myocardial mass. Likely mixed HCM and hypertensive heart disease. Cardiac monitor with 3 runs of NSVT with longest lasting 8 beats. Rare SVE and rare VE. Seen by Dr. Graciela Husbands and is now s/p ICD placement for primary prevention. -S/p ICD placement for primary prevention -Follow-up with Dr. Graciela Husbands as scheduled -Continue  entresto 97/103mg  BID -Continue coreg 37.5mg  BID -Continue spiro 25mg  daily -Continue hydralazine 50mg  BID -Continue imdur 15mg  qHS  #Black Stool: Resolved.    #Coronary Artery Disease: LHC 08/31/2020 wwith 60% distal RCA, however, may be more significant than previously thought with evidence of ischemia on CMR. Given recurrent admissions for chest pain, he subsequently underwent repeat cath with PCI to  distal RCA on 08/2021. -Continue ASA 81mg  daily -Continue plavix 75mg  daily; okay to hold for EGD/colo -Continue liptor 40mg  daily -Continue coreg 37.5mg  BID -Continue PPI as above   #Severe ascending aortic dilation: 4.7 cm by CTA 05/22/2020.  Ascending aorta 45 mm by cardiac stress MRI. No evidence of dissection on CTA chest in 08/2021. -Follows with cardiothoracic surgery -Will order CTA for 08/2022   #HLD -Continue liptor 40mg  daily -LDL at goal 52   #Moderate AI: #Mild to moderate MR: Moderate AI on CMR and appeared mild on TTE 02/2021. Mild MR noted on TTE 02/2021. -Serial TTE monitoring; next 02/2023 unless change in symptoms   #Tobacco use: -States he is not currently smoking  #Chronic HA: Not related to imdur and does not correlate with elevated BP. Patient is amenable to seeing Neuro. -Referred to neuro     Follow-up in 3 months  Medication Adjustments/Labs and Tests Ordered: Current medicines are reviewed at length with the patient today.  Concerns regarding medicines are outlined above.   No orders of the defined types were placed in this encounter.  No orders of the defined types were placed in this encounter.  There are no Patient Instructions on file for this visit.    Signed, Meriam Sprague, MD  09/19/2022 7:02 AM    [facility]

## 2022-09-22 ENCOUNTER — Other Ambulatory Visit: Payer: Self-pay | Admitting: Cardiology

## 2022-09-24 ENCOUNTER — Encounter: Payer: Self-pay | Admitting: *Deleted

## 2022-09-24 ENCOUNTER — Encounter: Payer: Self-pay | Admitting: Cardiology

## 2022-09-24 ENCOUNTER — Ambulatory Visit: Payer: Commercial Managed Care - HMO | Attending: Cardiology | Admitting: Cardiology

## 2022-09-24 VITALS — BP 154/102 | HR 79 | Ht 69.0 in | Wt 222.8 lb

## 2022-09-24 DIAGNOSIS — I5022 Chronic systolic (congestive) heart failure: Secondary | ICD-10-CM | POA: Diagnosis not present

## 2022-09-24 DIAGNOSIS — I428 Other cardiomyopathies: Secondary | ICD-10-CM | POA: Diagnosis not present

## 2022-09-24 DIAGNOSIS — I5042 Chronic combined systolic (congestive) and diastolic (congestive) heart failure: Secondary | ICD-10-CM

## 2022-09-24 DIAGNOSIS — I422 Other hypertrophic cardiomyopathy: Secondary | ICD-10-CM

## 2022-09-24 DIAGNOSIS — I10 Essential (primary) hypertension: Secondary | ICD-10-CM | POA: Diagnosis not present

## 2022-09-24 DIAGNOSIS — I2510 Atherosclerotic heart disease of native coronary artery without angina pectoris: Secondary | ICD-10-CM

## 2022-09-24 DIAGNOSIS — I340 Nonrheumatic mitral (valve) insufficiency: Secondary | ICD-10-CM

## 2022-09-24 DIAGNOSIS — Z95810 Presence of automatic (implantable) cardiac defibrillator: Secondary | ICD-10-CM

## 2022-09-24 DIAGNOSIS — I7121 Aneurysm of the ascending aorta, without rupture: Secondary | ICD-10-CM

## 2022-09-24 DIAGNOSIS — I5020 Unspecified systolic (congestive) heart failure: Secondary | ICD-10-CM

## 2022-09-24 NOTE — Patient Instructions (Signed)
Medication Instructions:   Your physician recommends that you continue on your current medications as directed. Please refer to the Current Medication list given to you today.  *If you need a refill on your cardiac medications before your next appointment, please call your pharmacy*    Follow-Up: At [facility], you and your health needs are our priority.  As part of our continuing mission to provide you with exceptional heart care, we have created designated Provider Care Teams.  These Care Teams include your primary Cardiologist (physician) and Advanced Practice Providers (APPs -  Physician Assistants and Nurse Practitioners) who all work together to provide you with the care you need, when you need it.  We recommend signing up for the patient portal called "MyChart".  Sign up information is provided on this After Visit Summary.  MyChart is used to connect with patients for Virtual Visits (Telemedicine).  Patients are able to view lab/test results, encounter notes, upcoming appointments, etc.  Non-urgent messages can be sent to your provider as well.   To learn more about what you can do with MyChart, go to [email or web address].    Your next appointment:   6 month(s)  Provider:   Dr. Izora Ribas   Other Instructions  Blood Pressure Record Sheet To take your blood pressure, you will need a blood pressure machine. You can buy a blood pressure machine (blood pressure monitor) at your clinic, drug store, or online. When choosing one, consider: An automatic monitor that has an arm cuff. A cuff that wraps snugly around your upper arm. You should be able to fit only one finger between your arm and the cuff. A device that stores blood pressure reading results. Do not choose a monitor that measures your blood pressure from your wrist or finger. Follow your health care provider's instructions for how to take your blood pressure. To use this form: Take your blood pressure  medications every day These measurements should be taken when you have been at rest for at least 10-15 min Take at least 2 readings with each blood pressure check. This makes sure the results are correct. Wait 1-2 minutes between measurements. Write down the results in the spaces on this form. Keep in mind it should always be recorded systolic over diastolic. Both numbers are important.  Repeat this every day for 2-3 weeks, or as told by your health care provider.  Make a follow-up appointment with your health care provider to discuss the results.  Blood Pressure Log Date Medications taken? (Y/N) Blood Pressure Time of Day

## 2022-09-25 ENCOUNTER — Other Ambulatory Visit: Payer: Self-pay

## 2022-09-25 MED ORDER — CARVEDILOL 25 MG PO TABS: 37.5000 mg | ORAL_TABLET | Freq: Two times a day (BID) | ORAL | 3 refills | Status: DC

## 2022-10-24 ENCOUNTER — Other Ambulatory Visit: Payer: Commercial Managed Care - HMO

## 2022-10-24 ENCOUNTER — Ambulatory Visit (INDEPENDENT_AMBULATORY_CARE_PROVIDER_SITE_OTHER): Payer: Self-pay | Admitting: Surgery

## 2022-10-24 ENCOUNTER — Encounter: Payer: Self-pay | Admitting: Surgery

## 2022-10-24 VITALS — BP 117/77 | HR 70 | Resp 20 | Ht 69.0 in | Wt 222.0 lb

## 2022-10-24 DIAGNOSIS — I7121 Aneurysm of the ascending aorta, without rupture: Secondary | ICD-10-CM

## 2022-10-24 NOTE — Progress Notes (Signed)
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  He underwent PCI of the distal RCA last year after he presented with chest pain.  He continues to feel well without chest pain or shortness of breath.  Current Outpatient Medications  Medication Sig Dispense Refill   acetaminophen (TYLENOL) 500 MG tablet Take 2 tablets (1,000 mg total) by mouth every 8 (eight) hours as needed for up to 30 doses for mild pain or fever. 60 tablet 0   aspirin EC 81 MG tablet Take 1 tablet (81 mg total) by mouth every evening. Swallow whole. 30 tablet 3   atorvastatin (LIPITOR) 40 MG tablet Take 1 tablet (40 mg total) by mouth every evening. 90 tablet 3   carvedilol (COREG) 25 MG tablet Take 1.5 tablets (37.5 mg total) by mouth 2 (two) times daily. 270 tablet 3   clopidogrel (PLAVIX) 75 MG tablet TAKE 1 TABLET(75 MG) BY MOUTH DAILY WITH BREAKFAST 30 tablet 11   FARXIGA 10 MG TABS tablet Take 1 tablet (10 mg total) by mouth daily before breakfast. 30 tablet 5   hydrALAZINE (APRESOLINE) 25 MG tablet Take 1 tablet (25 mg total) by mouth 3 (three) times daily. 270 tablet 3   isosorbide mononitrate (IMDUR) 30 MG 24 hr tablet Take 0.5 tablets (15 mg total) by mouth daily. 45 tablet 3   methylPREDNISolone (MEDROL DOSEPAK) 4 MG TBPK tablet Take 24 mg on day 1, 20 mg on day 2, 16 mg on day 3, 12 mg on day 4, 8 mg on day 5, 4 mg on day 6.  Take all tablets in each row at once, do not spread tablets out throughout the day. 21 tablet 0   pantoprazole (PROTONIX) 40 MG tablet Take 1 tablet (40 mg total) by mouth daily. 30 tablet 11   sacubitril-valsartan (ENTRESTO) 97-103 MG Take 1 tablet by mouth 2 (two) times daily. 180 tablet 3   spironolactone (ALDACTONE) 25 MG tablet Take 1 tablet (25 mg total) by mouth daily. 90 tablet 2   No current facility-administered medications for this visit.     Physical Exam: BP 117/77   Pulse 70    Resp 20   Ht 5\' 9"  (1.753 m)   Wt 222 lb (100.7 kg)   SpO2 96% Comment: RA  BMI 32.78 kg/m  He looks well. Cardiac exam shows a regular rate and rhythm with normal heart sounds.  There is no murmur. Lungs are clear. There is no peripheral edema.  Diagnostic Tests:  Narrative & Impression  CLINICAL DATA:  Aortic aneurysm suspected ascending aortic aneurysm.   EXAM: CT ANGIOGRAPHY CHEST WITH CONTRAST   TECHNIQUE: Multidetector CT imaging of the chest was performed using the standard protocol during bolus administration of intravenous contrast. Multiplanar CT image reconstructions and MIPs were obtained to evaluate the vascular anatomy.   RADIATION DOSE REDUCTION: This exam was performed according to the departmental dose-optimization program which includes automated exposure control, adjustment of the mA and/or kV according to patient size and/or use of iterative reconstruction technique.   CONTRAST:  75mL OMNIPAQUE IOHEXOL 350 MG/ML SOLN   COMPARISON:  CT angiography chest from 09/06/2021.   FINDINGS: Cardiovascular: Moderate cardiomegaly. No pericardial effusion. There is a left sided ICD device. Coronary artery calcifications noted.   There is aneurysmal dilation of the thoracic aorta, with dimensions described below.   There is no dissection or penetrating atherosclerotic ulcer.  Please note, the examination is markedly limited due to cardiac motion along with patient's respiratory motion. This examination was not performed with cardiac gating. Future exams for thoracic aortic aneurysm evaluation should be performed with cardiac gating. Correlate clinically and with available prior exams, if there is need for repeat exam with cardiac gating.   The approximate measurements, which are amenable to cardiac motion, are described below:   Aortic root at Sinus of Valsalva: 4.3 x 4.4 x 4.8 cm.   Sinotubular junction: 4.6 x 5.0 cm.   Maximum outer diameter of  Ascending Aorta: 4.1 cm.   Maximum outer diameter of Descending Aorta: 3.5 cm.   The brachiocephalic, subclavians, proximal common carotid and proximal vertebral arteries are patent.   There is dilation of the main pulmonary trunk measuring up to 3.2 cm, which is nonspecific but can be seen with pulmonary artery hypertension.   Mediastinum/Nodes: Visualized thyroid gland appears grossly unremarkable. No solid / cystic mediastinal masses. The esophagus is nondistended precluding optimal assessment. No axillary, mediastinal or hilar lymphadenopathy by size criteria.   Lungs/Pleura: The central tracheo-bronchial tree is patent. There are dependent changes in bilateral lungs. No mass or consolidation. No pleural effusion or pneumothorax. No suspicious lung nodules.   Upper Abdomen: There is a 1.2 x 1.6 cm right adrenal adenoma. Remaining visualized upper abdominal viscera within normal limits.   Musculoskeletal: Bilateral symmetric gynecomastia noted. The visualized soft tissues of the chest wall are otherwise grossly unremarkable. No suspicious osseous lesions. There are mild multilevel degenerative changes in the visualized spine.   Review of the MIP images confirms the above findings.   IMPRESSION: 1. Limited examination for measurement of thoracic aorta. Please see discussion above. 2. Multiple other nonacute observations, as described above.   Aortic Atherosclerosis (ICD10-I70.0).     Electronically Signed   By: Jules Schick M.D.   On: 08/27/2022 14:53      Impression:  His current CTA shows the aortic root measurement to be 4.8 cm although there is significant motion artifact making accurate measurement difficult.  The sinotubular junction measures 4.6 x 5.0 cm.  The ascending aorta was 4.1 cm and the descending aorta 3.5 cm.  His aortic root diameter has been stable and is still below the surgical threshold of 5.5 cm.  I reviewed the CTA images with him and answered  his questions.  I stressed the importance of continued good blood pressure control in preventing further enlargement and acute aortic dissection.  I advised him against doing any heavy lifting or strenuous physical activity that may require a Valsalva maneuver and could suddenly raise his blood pressure to high levels.  I also advised him against taking fluoroquinolone antibiotics which have been associated with aneurysmal disease.  Plan:  I will see him back in 1 year with a CTA of the chest for aortic surveillance.  I spent 15 minutes performing this established patient evaluation and > 50% of this time was spent face to face counseling and coordinating the care of this patient's aortic aneurysm.    Alleen Borne, MD [facility] [phone]

## 2022-11-02 ENCOUNTER — Encounter: Payer: Commercial Managed Care - HMO | Attending: Cardiology

## 2022-11-02 DIAGNOSIS — I428 Other cardiomyopathies: Secondary | ICD-10-CM | POA: Insufficient documentation

## 2022-11-06 NOTE — Progress Notes (Signed)
Remote ICD transmission.   

## 2022-11-08 LAB — CUP PACEART REMOTE DEVICE CHECK
Battery Remaining Longevity: 180 mo
Battery Remaining Percentage: 100 %
Brady Statistic RV Percent Paced: 0 %
Date Time Interrogation Session: 20240906020100
HighPow Impedance: 79 Ohm
Implantable Lead Connection Status: 753985
Implantable Lead Implant Date: 20230906
Implantable Lead Location: 753860
Implantable Lead Model: 673
Implantable Lead Serial Number: 201402
Implantable Pulse Generator Implant Date: 20230906
Lead Channel Impedance Value: 463 Ohm
Lead Channel Pacing Threshold Amplitude: 0.8 V
Lead Channel Pacing Threshold Pulse Width: 0.4 ms
Lead Channel Setting Pacing Amplitude: 2.5 V
Lead Channel Setting Pacing Pulse Width: 0.4 ms
Lead Channel Setting Sensing Sensitivity: 0.5 mV
Pulse Gen Serial Number: 316727
Zone Setting Status: 755011

## 2022-12-10 ENCOUNTER — Encounter (HOSPITAL_COMMUNITY): Payer: Self-pay

## 2022-12-10 ENCOUNTER — Ambulatory Visit (HOSPITAL_COMMUNITY)
Admission: EM | Admit: 2022-12-10 | Discharge: 2022-12-10 | Disposition: A | Discharge status: Home / Self Care | Payer: Self-pay | Attending: Internal Medicine | Admitting: Internal Medicine

## 2022-12-10 DIAGNOSIS — J069 Acute upper respiratory infection, unspecified: Secondary | ICD-10-CM

## 2022-12-10 MED ORDER — PREDNISONE 10 MG PO TABS: 20.0000 mg | ORAL_TABLET | Freq: Every day | ORAL | 0 refills | Status: AC

## 2022-12-10 NOTE — Discharge Instructions (Addendum)
Likely viral upper respiratory infection. Do not need antibiotics for this. Can try the following for symptoms relief: Mucinex tablets over the counter and follow the package directions Can use a Humidifier at night Prednisone 20mg  daily for 5 days. Take this in the morning and take it with food. Rest when needed and stay hydrated Blood pressure was addressed during visit and recommended patient monitor this at home. The patient should follow up with PCP for ongoing management. Discussed alarm symptoms that would warrant emergent evaluation to which patient expressed understanding.   Return to urgent care or PCP if symptoms worsen or fail to resolve.

## 2022-12-10 NOTE — ED Triage Notes (Signed)
Patient c/o nasal congestion, headache, and a non productive cough x 1 week.  Patient denies taking any medications for his symptoms.

## 2022-12-10 NOTE — ED Provider Notes (Addendum)
[facility]    CSN: [id number] Arrival date & time: 12/10/22  1024      History   Chief Complaint Chief Complaint  Patient presents with   Cough   Nasal Congestion   Headache    HPI Thomas Spears is a 47 y.o. male.   47 yr old male who presents to urgent care with complaints of cough, congestion, drainage down throat throat for about 1 week. Does have a mild headache. Denies fevers, chills, sore throat, ear pain, chest pain, shortness of breath. Denies sick contacts except finaca that has similar symptoms. Does Not usually have allergies. He feels fine otherwise.   Has chronic severe hypertension. No chest pain, blurred vision, stroke like symptoms. Follows up with cardiology and next appointment isn't until Jan. 2025.   Cough Associated symptoms: headaches   Associated symptoms: no chest pain, no chills, no ear pain, no fever, no rash, no shortness of breath and no sore throat   Headache Associated symptoms: cough   Associated symptoms: no abdominal pain, no back pain, no ear pain, no eye pain, no fever, no seizures, no sore throat and no vomiting     Past Medical History:  Diagnosis Date   Aortic regurgitation    Ascending aorta dilation (HCC)    CAD (coronary artery disease), native coronary artery 09/20/2021   Chronic combined systolic and diastolic CHF (congestive heart failure) (HCC)    Community acquired pneumonia    HLD (hyperlipidemia)    Hypertension    Hypertensive crisis 05/22/2020   Hypertrophic cardiomyopathy (HCC)    Left ventricular hypertrophy    Mitral regurgitation    Mobitz type 1 second degree AV block    NSVT (nonsustained ventricular tachycardia) (HCC)    Premature atrial contractions    PVC's (premature ventricular contractions)    Tension headache 06/21/2020   Tobacco use    Tobacco use 09/20/2021    Patient Active Problem List   Diagnosis Date Noted   Syncope - near syncope 04/03/2022   positional lightheadedness  04/03/2022   Chronic combined systolic and diastolic CHF (congestive heart failure) (HCC)    Hypertrophic cardiomyopathy (HCC) 10/16/2021   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   Angina of effort (HCC) 09/20/2021   CAD (coronary artery disease), native coronary artery 09/20/2021   S/P angioplasty with stent 09/20/21 with PCI with DES to distal RCA 09/20/2021   HLD (hyperlipidemia) 09/20/2021   Tobacco use 09/20/2021   Dilated cardiomyopathy (HCC) 08/31/2020   Chest pain 07/13/2020   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   ED (erectile dysfunction) 06/08/2020   Severe uncontrolled hypertension 05/31/2020   Thoracic aortic aneurysm (HCC) 05/27/2020   HFrEF (heart failure with reduced ejection fraction) (HCC)    COVID-19    Cigarette nicotine dependence without complication 04/16/2016    Past Surgical History:  Procedure Laterality Date   CARDIAC CATHETERIZATION     CAROTID STENT INSERTION     CORONARY STENT INTERVENTION N/A 09/20/2021   Procedure: CORONARY STENT INTERVENTION;  Surgeon: Tonny Bollman, MD;  Location: [facility] INVASIVE CV LAB;  Service: Cardiovascular;  Laterality: N/A;   ICD IMPLANT N/A 11/01/2021   Procedure: ICD IMPLANT;  Surgeon: Duke Salvia, MD;  Location: [facility] INVASIVE CV LAB;  Service: Cardiovascular;  Laterality: N/A;   KNEE SURGERY Left    LEFT HEART CATH AND CORONARY ANGIOGRAPHY N/A 08/31/2020   Procedure: LEFT HEART CATH AND CORONARY ANGIOGRAPHY;  Surgeon: Marykay Lex, MD;  Location: [facility] INVASIVE CV LAB;  Service:  Cardiovascular;  Laterality: N/A;   LEFT HEART CATH AND CORONARY ANGIOGRAPHY N/A 09/20/2021   Procedure: LEFT HEART CATH AND CORONARY ANGIOGRAPHY;  Surgeon: Tonny Bollman, MD;  Location: [facility] INVASIVE CV LAB;  Service: Cardiovascular;  Laterality: N/A;   TONSILLECTOMY         Home Medications    Prior to Admission medications   Medication Sig Start Date End Date Taking? Authorizing Provider  acetaminophen (TYLENOL) 500 MG  tablet Take 2 tablets (1,000 mg total) by mouth every 8 (eight) hours as needed for up to 30 doses for mild pain or fever. 07/15/22   Theadora Rama Scales, PA-C  aspirin EC 81 MG tablet Take 1 tablet (81 mg total) by mouth every evening. Swallow whole. 03/20/21   Cathleen Corti, MD  atorvastatin (LIPITOR) 40 MG tablet Take 1 tablet (40 mg total) by mouth every evening. 12/19/21   Meriam Sprague, MD  carvedilol (COREG) 25 MG tablet Take 1.5 tablets (37.5 mg total) by mouth 2 (two) times daily. 09/25/22   Meriam Sprague, MD  clopidogrel (PLAVIX) 75 MG tablet TAKE 1 TABLET(75 MG) BY MOUTH DAILY WITH BREAKFAST 09/24/22   Graciella Freer, PA-C  FARXIGA 10 MG TABS tablet Take 1 tablet (10 mg total) by mouth daily before breakfast. 06/26/22   Meriam Sprague, MD  hydrALAZINE (APRESOLINE) 25 MG tablet Take 1 tablet (25 mg total) by mouth 3 (three) times daily. 01/05/22   Meriam Sprague, MD  isosorbide mononitrate (IMDUR) 30 MG 24 hr tablet Take 0.5 tablets (15 mg total) by mouth daily. 04/04/22   Meriam Sprague, MD  methylPREDNISolone (MEDROL DOSEPAK) 4 MG TBPK tablet Take 24 mg on day 1, 20 mg on day 2, 16 mg on day 3, 12 mg on day 4, 8 mg on day 5, 4 mg on day 6.  Take all tablets in each row at once, do not spread tablets out throughout the day. 07/15/22   Theadora Rama Scales, PA-C  pantoprazole (PROTONIX) 40 MG tablet Take 1 tablet (40 mg total) by mouth daily. 12/19/21   Meriam Sprague, MD  sacubitril-valsartan (ENTRESTO) 97-103 MG Take 1 tablet by mouth 2 (two) times daily. 12/19/21   Meriam Sprague, MD  spironolactone (ALDACTONE) 25 MG tablet Take 1 tablet (25 mg total) by mouth daily. 03/26/22   Meriam Sprague, MD    Family History Family History  Problem Relation Age of Onset   CAD Father    Colon cancer Neg Hx    Esophageal cancer Neg Hx    Stomach cancer Neg Hx    Rectal cancer Neg Hx     Social History Social History   Tobacco  Use   Smoking status: Every Day    Types: Cigarettes   Smokeless tobacco: Never   Tobacco comments:    Patient given 1-800-quit-now.  Agreed to having referral faxed for smoking cessation  Vaping Use   Vaping status: Never Used  Substance Use Topics   Alcohol use: Not Currently   Drug use: Never     Allergies   Mushroom extract complex   Review of Systems Review of Systems  Constitutional:  Negative for chills and fever.  HENT:  Negative for ear pain and sore throat.   Eyes:  Negative for pain and visual disturbance.  Respiratory:  Positive for cough. Negative for shortness of breath.   Cardiovascular:  Negative for chest pain and palpitations.  Gastrointestinal:  Negative for abdominal pain and vomiting.  Genitourinary:  Negative for dysuria and hematuria.  Musculoskeletal:  Negative for arthralgias and back pain.  Skin:  Negative for color change and rash.  Neurological:  Positive for headaches. Negative for seizures and syncope.  All other systems reviewed and are negative.    Physical Exam Triage Vital Signs ED Triage Vitals  Encounter Vitals Group     BP 12/10/22 1049 (!) 184/128     Systolic BP Percentile --      Diastolic BP Percentile --      Pulse Rate 12/10/22 1049 95     Resp 12/10/22 1049 16     Temp 12/10/22 1049 97.8 F (36.6 C)     Temp Source 12/10/22 1049 Oral     SpO2 12/10/22 1049 94 %     Weight --      Height --      Head Circumference --      Peak Flow --      Pain Score 12/10/22 1052 6     Pain Loc --      Pain Education --      Exclude from Growth Chart --    No data found.  Updated Vital Signs BP (!) 184/128 (BP Location: Left Arm) Comment: Paatient states he has not taken his BP meds today.  Pulse 95   Temp 97.8 F (36.6 C) (Oral)   Resp 16   SpO2 94%   Visual Acuity Right Eye Distance:   Left Eye Distance:   Bilateral Distance:    Right Eye Near:   Left Eye Near:    Bilateral Near:     Physical Exam Vitals and  nursing note reviewed.  Constitutional:      General: He is not in acute distress.    Appearance: He is well-developed.  HENT:     Head: Normocephalic and atraumatic.     Right Ear: Tympanic membrane normal.     Left Ear: Tympanic membrane normal.     Mouth/Throat:     Mouth: Mucous membranes are moist.     Pharynx: Oropharynx is clear.  Eyes:     Conjunctiva/sclera: Conjunctivae normal.  Cardiovascular:     Rate and Rhythm: Normal rate and regular rhythm.     Heart sounds: No murmur heard. Pulmonary:     Effort: Pulmonary effort is normal. No respiratory distress.     Breath sounds: Normal breath sounds.  Abdominal:     Palpations: Abdomen is soft.     Tenderness: There is no abdominal tenderness.  Musculoskeletal:        General: No swelling.     Cervical back: Neck supple.  Skin:    General: Skin is warm and dry.     Capillary Refill: Capillary refill takes less than 2 seconds.  Neurological:     Mental Status: He is alert.     Cranial Nerves: No cranial nerve deficit.  Psychiatric:        Mood and Affect: Mood normal.      UC Treatments / Results  Labs (all labs ordered are listed, but only abnormal results are displayed) Labs Reviewed - No data to display  EKG   Radiology No results found.  Procedures Procedures (including critical care time)  Medications Ordered in UC Medications - No data to display  Initial Impression / Assessment and Plan / UC Course  I have reviewed the triage vital signs and the nursing notes.  Pertinent labs & imaging results that were available during my care  of the patient were reviewed by me and considered in my medical decision making (see chart for details).  Clinical Course as of 12/10/22 1116  Mon Dec 10, 2022  1101 BP(!): 184/128 [EW]    Clinical Course User Index [EW] Doristine Mango A, PA-C    Viral upper respiratory tract infection   Likely viral upper respiratory infection. Do not need antibiotics for this.  Can try the following for symptoms relief: Mucinex tablets over the counter and follow the package directions Can use a Humidifier at night Prednisone 20mg  daily for 5 days. Take this in the morning and take it with food. Rest when needed and stay hydrated Blood pressure was addressed during visit and recommended patient monitor this at home. The patient should follow up with PCP for ongoing management. Discussed alarm symptoms that would warrant emergent evaluation to which patient expressed understanding.   Return to urgent care or PCP if symptoms worsen or fail to resolve.    Final Clinical Impressions(s) / UC Diagnoses   Final diagnoses:  None   Discharge Instructions   None    ED Prescriptions   None    PDMP not reviewed this encounter.   Quintella Reichert 12/10/22 1129    Landis Martins, PA-C 12/10/22 1147

## 2023-01-28 ENCOUNTER — Encounter: Payer: Self-pay | Admitting: Internal Medicine

## 2023-01-31 ENCOUNTER — Encounter: Payer: Self-pay | Admitting: Internal Medicine

## 2023-01-31 NOTE — Telephone Encounter (Signed)
Error

## 2023-02-01 ENCOUNTER — Encounter: Payer: Self-pay | Attending: Cardiology

## 2023-02-01 DIAGNOSIS — I428 Other cardiomyopathies: Secondary | ICD-10-CM | POA: Insufficient documentation

## 2023-02-01 LAB — CUP PACEART REMOTE DEVICE CHECK
Battery Remaining Longevity: 180 mo
Battery Remaining Percentage: 100 %
Brady Statistic RV Percent Paced: 0 %
Date Time Interrogation Session: 20241206020200
HighPow Impedance: 78 Ohm
Implantable Lead Connection Status: 753985
Implantable Lead Implant Date: 20230906
Implantable Lead Location: 753860
Implantable Lead Model: 673
Implantable Lead Serial Number: 201402
Implantable Pulse Generator Implant Date: 20230906
Lead Channel Impedance Value: 470 Ohm
Lead Channel Pacing Threshold Amplitude: 0.6 V
Lead Channel Pacing Threshold Pulse Width: 0.4 ms
Lead Channel Setting Pacing Amplitude: 2.5 V
Lead Channel Setting Pacing Pulse Width: 0.4 ms
Lead Channel Setting Sensing Sensitivity: 0.5 mV
Pulse Gen Serial Number: 316727
Zone Setting Status: 755011

## 2023-02-11 ENCOUNTER — Other Ambulatory Visit: Payer: Self-pay

## 2023-02-11 ENCOUNTER — Encounter: Payer: Self-pay | Admitting: Internal Medicine

## 2023-02-11 DIAGNOSIS — I428 Other cardiomyopathies: Secondary | ICD-10-CM

## 2023-02-11 DIAGNOSIS — I7121 Aneurysm of the ascending aorta, without rupture: Secondary | ICD-10-CM

## 2023-02-11 DIAGNOSIS — R519 Headache, unspecified: Secondary | ICD-10-CM

## 2023-02-11 DIAGNOSIS — K921 Melena: Secondary | ICD-10-CM

## 2023-02-11 DIAGNOSIS — I5042 Chronic combined systolic (congestive) and diastolic (congestive) heart failure: Secondary | ICD-10-CM

## 2023-02-11 DIAGNOSIS — Z95810 Presence of automatic (implantable) cardiac defibrillator: Secondary | ICD-10-CM

## 2023-02-11 DIAGNOSIS — I10 Essential (primary) hypertension: Secondary | ICD-10-CM

## 2023-02-11 DIAGNOSIS — I351 Nonrheumatic aortic (valve) insufficiency: Secondary | ICD-10-CM

## 2023-02-11 DIAGNOSIS — I422 Other hypertrophic cardiomyopathy: Secondary | ICD-10-CM

## 2023-02-11 DIAGNOSIS — I340 Nonrheumatic mitral (valve) insufficiency: Secondary | ICD-10-CM

## 2023-02-11 DIAGNOSIS — I2510 Atherosclerotic heart disease of native coronary artery without angina pectoris: Secondary | ICD-10-CM

## 2023-02-11 MED ORDER — PANTOPRAZOLE SODIUM 40 MG PO TBEC: 40.0000 mg | DELAYED_RELEASE_TABLET | Freq: Every day | ORAL | 7 refills | Status: DC

## 2023-02-11 MED ORDER — HYDRALAZINE HCL 25 MG PO TABS: 25.0000 mg | ORAL_TABLET | Freq: Three times a day (TID) | ORAL | 2 refills | Status: DC

## 2023-03-27 ENCOUNTER — Other Ambulatory Visit: Payer: Self-pay | Admitting: Internal Medicine

## 2023-03-27 ENCOUNTER — Encounter: Payer: 59 | Attending: Cardiology | Admitting: Internal Medicine

## 2023-03-27 VITALS — BP 130/90 | HR 74 | Ht 69.0 in | Wt 228.0 lb

## 2023-03-27 DIAGNOSIS — Z95810 Presence of automatic (implantable) cardiac defibrillator: Secondary | ICD-10-CM

## 2023-03-27 DIAGNOSIS — I351 Nonrheumatic aortic (valve) insufficiency: Secondary | ICD-10-CM

## 2023-03-27 DIAGNOSIS — I428 Other cardiomyopathies: Secondary | ICD-10-CM | POA: Diagnosis not present

## 2023-03-27 DIAGNOSIS — I10 Essential (primary) hypertension: Secondary | ICD-10-CM

## 2023-03-27 DIAGNOSIS — I5042 Chronic combined systolic (congestive) and diastolic (congestive) heart failure: Secondary | ICD-10-CM

## 2023-03-27 DIAGNOSIS — I7121 Aneurysm of the ascending aorta, without rupture: Secondary | ICD-10-CM

## 2023-03-27 DIAGNOSIS — I2510 Atherosclerotic heart disease of native coronary artery without angina pectoris: Secondary | ICD-10-CM | POA: Diagnosis not present

## 2023-03-27 DIAGNOSIS — I4729 Other ventricular tachycardia: Secondary | ICD-10-CM | POA: Diagnosis not present

## 2023-03-27 DIAGNOSIS — I422 Other hypertrophic cardiomyopathy: Secondary | ICD-10-CM | POA: Diagnosis not present

## 2023-03-27 DIAGNOSIS — I340 Nonrheumatic mitral (valve) insufficiency: Secondary | ICD-10-CM | POA: Diagnosis not present

## 2023-03-27 LAB — LIPID PANEL
Chol/HDL Ratio: 5.2 {ratio} — ABNORMAL HIGH (ref 0.0–5.0)
Cholesterol, Total: 155 mg/dL (ref 100–199)
HDL: 30 mg/dL — ABNORMAL LOW (ref >39)
LDL Chol Calc (NIH): 101 mg/dL — ABNORMAL HIGH (ref 0–99)
Triglycerides: 131 mg/dL (ref 0–149)
VLDL Cholesterol Cal: 24 mg/dL (ref 5–40)

## 2023-03-27 MED ORDER — CARVEDILOL 25 MG PO TABS: 37.5000 mg | ORAL_TABLET | Freq: Two times a day (BID) | ORAL | 3 refills | Status: DC

## 2023-03-27 MED ORDER — FARXIGA 10 MG PO TABS: 10.0000 mg | ORAL_TABLET | Freq: Every day | ORAL | 3 refills | Status: DC

## 2023-03-27 MED ORDER — ISOSORBIDE MONONITRATE ER 30 MG PO TB24: 30.0000 mg | ORAL_TABLET | Freq: Every day | ORAL | 3 refills | Status: DC

## 2023-03-27 MED ORDER — ATORVASTATIN CALCIUM 40 MG PO TABS
40.0000 mg | ORAL_TABLET | Freq: Every evening | ORAL | 3 refills | Status: DC
Start: 2023-03-27 — End: 2023-04-04

## 2023-03-27 NOTE — Telephone Encounter (Signed)
Please address.

## 2023-03-27 NOTE — Patient Instructions (Signed)
Medication Instructions:  Your physician has recommended you make the following change in your medication:  STOP: clopidogrel (Plavix) INCREASE: isosorbide mononitrate (Imdur) to 30 mg by mouth once daily  *If you need a refill on your cardiac medications before your next appointment, please call your pharmacy*   Lab Work: FLP  If you have labs (blood work) drawn today and your tests are completely normal, you will receive your results only by: MyChart Message (if you have MyChart) OR A paper copy in the mail If you have any lab test that is abnormal or we need to change your treatment, we will call you to review the results.   Testing/Procedures: Your physician has requested that you have an echocardiogram. Echocardiography is a painless test that uses sound waves to create images of your heart. It provides your doctor with information about the size and shape of your heart and how well your heart's chambers and valves are working. This procedure takes approximately one hour. There are no restrictions for this procedure. Please do NOT wear cologne, perfume, aftershave, or lotions (deodorant is allowed). Please arrive 15 minutes prior to your appointment time.  Please note: We ask at that you not bring children with you during ultrasound (echo/ vascular) testing. Due to room size and safety concerns, children are not allowed in the ultrasound rooms during exams. Our front office staff cannot provide observation of children in our lobby area while testing is being conducted. An adult accompanying a patient to their appointment will only be allowed in the ultrasound room at the discretion of the ultrasound technician under special circumstances. We apologize for any inconvenience.   Your physician has requested that you complete a Home Genetic Test.  This test will come to you in the mail.   Your physician has referred you to see the Pharmacy Clinic for Hypertension in 1 month.   Your  physician has requested that you check your BP for 2 weeks and send in readings.    Follow-Up: At [facility], you and your health needs are our priority.  As part of our continuing mission to provide you with exceptional heart care, we have created designated Provider Care Teams.  These Care Teams include your primary Cardiologist (physician) and Advanced Practice Providers (APPs -  Physician Assistants and Nurse Practitioners) who all work together to provide you with the care you need, when you need it.   Your next appointment:   3-4 month(s)  Provider:   Christell Constant, MD

## 2023-03-27 NOTE — Progress Notes (Signed)
Cardiology Office Note:  .    Date:  03/27/2023  ID:  Thomas Spears Human, DOB [birth year], MRN [id number] PCP: Patient, No Pcp Per  [facility] Providers Cardiologist:  Christell Constant, MD     CC: Establish care  History of Present Illness: .    Thomas Spears is a 48 y.o. male  with heart failure and hypertrophic cardiomyopathy who presents for medication refills and evaluation of symptoms. He was previously managed by Dr. Shari Prows for his cardiac conditions.  He has a history of heart failure with reduced ejection fraction and hypertrophic cardiomyopathy diagnosed in 2023, possibly related to longstanding hypertension. He also has severe aortic dilation and mild to moderate mitral regurgitation. His last heart catheterization showed nonobstructive disease, and he has had prior percutaneous intervention with stenting in 2023. His heart function had recovered on goal-directed medical therapy, but he has experienced non-sustained ventricular tachycardia.  He experiences 'weak spells', lightheadedness, and severe headaches. The lightheadedness feels like the room is spinning, even when sitting down. He had a severe headache with a blood pressure reading of 216/117, which led to him being sent home from work. 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  He feels his heartbeat more frequently, describing it as 'do, do, do, do, do', and experiences tingling in his left arm. He has occasional faint chest pains and tingling in his left arm. No recent chest pains at the time of the visit.  He has a history of smoking from age 42 until recently, having quit about two months ago. He attributes some of his stress and difficulty performing duties at work to his health issues.  He has two children, a 76 year old daughter and a 96-year-old son. He has not undergone genetic testing due to cost  concerns, but he has a family history review with a clinical geneticist.  Relevant histories: .  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   Studies Reviewed: .   Cardiac Studies & Procedures   CARDIAC CATHETERIZATION  CARDIAC CATHETERIZATION 09/20/2021  Narrative 1.  Patent left main, LAD, and left circumflex, with mild nonobstructive plaquing 2.  Severe distal RCA stenosis with positive RFR of 0.78, treated successfully with PCI using a 3.5 x 24 mm Synergy DES deployed over the distal RCA and extending into the posterolateral branch  Recommendations: Continued risk reduction measures and GDMT for heart failure, aspirin and clopidogrel x 6 months without interruption, okay for discharge this evening as long as criteria met.  Findings Coronary Findings Diagnostic  Dominance: Right  Left Main Vessel is large. Mid LM to Dist LM lesion is 20% stenosed. The lesion is focal and concentric.  Left Anterior Descending There is mild diffuse disease throughout the vessel. Ost LAD to Prox LAD lesion is 30% stenosed. The lesion is focal and concentric. Mid LAD lesion is 45% stenosed. The lesion is located at the bifurcation and eccentric.  Second Diagonal Branch Vessel is small in size.  Third Diagonal Branch Vessel is small in size.  Left Circumflex Vessel is large. The vessel exhibits minimal luminal irregularities.  First Obtuse Marginal Branch Vessel is small in size. Vessel is angiographically normal.  Second Obtuse Marginal Branch Vessel is small in size.  First Left Posterolateral Branch Vessel is small in size.  Left Posterior Atrioventricular Artery Vessel is small in size.  Right Coronary Artery Vessel is large. There is  moderate focal disease in the vessel. The vessel exhibits minimal luminal irregularities. The vessel is moderately tortuous. Prox RCA lesion is 30% stenosed. The lesion is eccentric. Dist RCA lesion is 75% stenosed.  The lesion is discrete and concentric.  Acute Marginal Branch Vessel is small in size.  Right Ventricular Branch Vessel is small in size.  First Right Posterolateral Branch Vessel is moderate in size.  Second Right Posterolateral Branch Vessel is large in size.  Intervention  Dist RCA lesion Stent CATH LAUNCHER 6FR JR4 guide catheter was inserted. Lesion crossed with guidewire using a WIRE COUGAR XT STRL 190CM. Pre-stent angioplasty was performed using a BALLN SAPPHIRE 3.0X15. A drug-eluting stent was successfully placed using a SYNERGY XD 3.50X24. Post-stent angioplasty was performed using a BALL SAPPHIRE NC24 3.75X15. Maximum pressure:  16 atm. Post-Intervention Lesion Assessment The intervention was successful. Pre-interventional TIMI flow is 3. Post-intervention TIMI flow is 3. No complications occurred at this lesion. There is a 0% residual stenosis post intervention.   CARDIAC CATHETERIZATION  CARDIAC CATHETERIZATION 08/31/2020  Narrative  LV end diastolic pressure is moderately elevated.  There is no aortic valve stenosis.  Mid LM to Dist LM lesion is 20% stenosed.  Ost LAD to Prox LAD lesion is 30% stenosed.  Mid LAD lesion is 45% stenosed @ 1st Diag (40% pre & 50% post 1st Diag)  Prox RCA lesion is 30% stenosed.  Dist RCA lesion is 60% stenosed - discrete focal lesion, not flow limiting.  No evidence of stent.  SUMMARY  Diffuse mild to moderate multivessel disease but no obvious culprit lesion to explain significant reduced EF.  Unable to visualize any previously placed stents.  Moderately elevated LVEDP   RECOMMENDATIONS  Continue to titrate GDMT for nonischemic CM. ->  Anticipate titrating up carvedilol to 12.5 mg twice daily and further titration of Entresto.  May also consider spironolactone   Bryan Lemma, MD  Findings Coronary Findings Diagnostic  Dominance: Right  Left Main Vessel was injected. Vessel is large. Mid LM to Dist LM  lesion is 20% stenosed. The lesion is focal and concentric.  Left Anterior Descending Ost LAD to Prox LAD lesion is 30% stenosed. The lesion is focal and concentric. Mid LAD lesion is 45% stenosed. The lesion is located at the bifurcation and eccentric.  Second Diagonal Branch Vessel is small in size.  Third Diagonal Branch Vessel is small in size.  Left Circumflex Vessel is large. Vessel is angiographically normal.  First Obtuse Marginal Branch Vessel is small in size. Vessel is angiographically normal.  Second Obtuse Marginal Branch Vessel is small in size.  First Left Posterolateral Branch Vessel is small in size.  Left Posterior Atrioventricular Artery Vessel is small in size.  Right Coronary Artery Vessel was injected. Vessel is large. There is moderate focal disease in the vessel. The vessel exhibits minimal luminal irregularities. The vessel is moderately tortuous. Prox RCA lesion is 30% stenosed. The lesion is eccentric. Dist RCA lesion is 60% stenosed. The lesion is discrete and concentric.  Acute Marginal Branch Vessel is small in size.  Right Ventricular Branch Vessel is small in size.  First Right Posterolateral Branch Vessel is moderate in size.  Second Right Posterolateral Branch Vessel is large in size.  Intervention  No interventions have been documented.    ECHOCARDIOGRAM  ECHOCARDIOGRAM COMPLETE 03/20/2021  Narrative ECHOCARDIOGRAM REPORT    Patient Name:   Thomas Spears Date of Exam: 03/20/2021 Medical Rec #:  [id number]        Height:  69.0 in Accession #:    [id number]       Weight:       229.0 lb Date of Birth:  1975-08-31        BSA:          2.188 m Patient Age:    45 years         BP:           130/84 mmHg Patient Gender: M                HR:           65 bpm. Exam Location:  Outpatient  Procedure: 2D Echo, Cardiac Doppler, Color Doppler and Strain Analysis  Indications:    R07.9* Chest pain, unspecified; R06.9 DOE;  I50.42 Chronic combined systolic (congestive) and diastolic (congestive) heart failure  History:        Patient has prior history of Echocardiogram examinations, most recent 05/23/2020. CHF and Cardiomyopathy, CAD, Signs/Symptoms:Chest Pain and Dyspnea; Risk Factors:Current Smoker, Hypertension and Dyslipidemia. Patient has intermittent chest tightness with DOE. He denies leg edema.  Sonographer:    Carlos American RVT, RDCS (AE), RDMS Referring Phys: [phone] CAITLIN S WALKER  IMPRESSIONS   1. No obstructive gradient. Left ventricular ejection fraction, by estimation, is 60 to 65%. The left ventricle has normal function. The left ventricle has no regional wall motion abnormalities. There is severe concentric left ventricular hypertrophy. Left ventricular diastolic parameters are consistent with Grade II diastolic dysfunction (pseudonormalization). 2. Right ventricular systolic function is normal. The right ventricular size is normal. 3. Left atrial size was moderately dilated. 4. The mitral valve is normal in structure. Mild mitral valve regurgitation. No evidence of mitral stenosis. 5. The aortic valve is normal in structure. Aortic valve regurgitation is mild. No aortic stenosis is present. 6. Aortic dilatation noted. There is moderate dilatation of the aortic root, measuring 48 mm. There is moderate dilatation of the ascending aorta, measuring 45 mm. 7. The inferior vena cava is normal in size with greater than 50% respiratory variability, suggesting right atrial pressure of 3 mmHg.  Comparison(s): The left ventricular function has improved. EF 25%, severe LVH, AOV 50mm, small pericardial effusion.  Conclusion(s)/Recommendation(s): Findings consistent with hypertrophic cardiomyopathy. Prior Cardiac MRI has been performed.  FINDINGS Left Ventricle: No obstructive gradient. Left ventricular ejection fraction, by estimation, is 60 to 65%. The left ventricle has normal function. The left  ventricle has no regional wall motion abnormalities. The left ventricular internal cavity size was normal in size. There is severe concentric left ventricular hypertrophy. Left ventricular diastolic parameters are consistent with Grade II diastolic dysfunction (pseudonormalization).  Right Ventricle: The right ventricular size is normal. No increase in right ventricular wall thickness. Right ventricular systolic function is normal.  Left Atrium: Left atrial size was moderately dilated.  Right Atrium: Right atrial size was normal in size.  Pericardium: There is no evidence of pericardial effusion.  Mitral Valve: The mitral valve is normal in structure. Mild mitral valve regurgitation. No evidence of mitral valve stenosis.  Tricuspid Valve: The tricuspid valve is normal in structure. Tricuspid valve regurgitation is not demonstrated. No evidence of tricuspid stenosis.  Aortic Valve: The aortic valve is normal in structure. Aortic valve regurgitation is mild. Aortic regurgitation PHT measures 1392 msec. No aortic stenosis is present. Aortic valve mean gradient measures 6.0 mmHg. Aortic valve peak gradient measures 11.8 mmHg. Aortic valve area, by VTI measures 3.11 cm.  Pulmonic Valve: The pulmonic valve was normal in structure.  Pulmonic valve regurgitation is trivial. No evidence of pulmonic stenosis.  Aorta: The aortic root is normal in size and structure and aortic dilatation noted. There is moderate dilatation of the aortic root, measuring 48 mm. There is moderate dilatation of the ascending aorta, measuring 45 mm.  Venous: The inferior vena cava is normal in size with greater than 50% respiratory variability, suggesting right atrial pressure of 3 mmHg.  IAS/Shunts: No atrial level shunt detected by color flow Doppler.   LEFT VENTRICLE PLAX 2D LVIDd:         4.96 cm      Diastology LVIDs:         3.46 cm      LV e' medial:    4.80 cm/s LV PW:         1.65 cm      LV E/e' medial:   12.5 LV IVS:        2.06 cm      LV e' lateral:   4.50 cm/s LVOT diam:     2.40 cm      LV E/e' lateral: 13.3 LV SV:         81 LV SV Index:   37 LVOT Area:     4.52 cm  3D Volume EF: LV Volumes (MOD)            3D EF:        42 % LV vol d, MOD A2C: 155.0 ml LV EDV:       223 ml LV vol d, MOD A4C: 197.0 ml LV ESV:       130 ml LV vol s, MOD A2C: 78.6 ml  LV SV:        93 ml LV vol s, MOD A4C: 108.0 ml LV SV MOD A2C:     76.4 ml LV SV MOD A4C:     197.0 ml LV SV MOD BP:      85.2 ml  RIGHT VENTRICLE RV S prime:     15.10 cm/s TAPSE (M-mode): 2.6 cm  LEFT ATRIUM              Index        RIGHT ATRIUM           Index LA diam:        4.90 cm  2.24 cm/m   RA Area:     21.10 cm LA Vol (A2C):   122.0 ml 55.75 ml/m  RA Volume:   62.10 ml  28.38 ml/m LA Vol (A4C):   78.2 ml  35.73 ml/m LA Biplane Vol: 98.3 ml  44.92 ml/m AORTIC VALVE                     PULMONIC VALVE AV Area (Vmax):    3.10 cm      PV Vmax:          1.16 m/s AV Area (Vmean):   3.01 cm      PV Peak grad:     5.4 mmHg AV Area (VTI):     3.11 cm      PR End Diast Vel: 1.38 msec AV Vmax:           172.00 cm/s AV Vmean:          110.000 cm/s AV VTI:            0.260 m AV Peak Grad:      11.8 mmHg AV Mean Grad:  6.0 mmHg LVOT Vmax:         118.00 cm/s LVOT Vmean:        73.100 cm/s LVOT VTI:          0.179 m LVOT/AV VTI ratio: 0.69 AI PHT:            1392 msec AR Vena Contracta: 0.39 cm  AORTA Ao Root diam: 4.80 cm Ao Asc diam:  4.50 cm Ao Arch diam: 3.7 cm  MITRAL VALVE MV Area (PHT): 3.12 cm    SHUNTS MV Decel Time: 243 msec    Systemic VTI:  0.18 m MV E velocity: 59.90 cm/s  Systemic Diam: 2.40 cm MV A velocity: 84.60 cm/s MV E/A ratio:  0.71  Donato Schultz MD Electronically signed by Donato Schultz MD Signature Date/Time: 03/20/2021/10:32:53 AM    Final   MONITORS  LONG TERM MONITOR (3-14 DAYS) 09/20/2020  Narrative  Patch wear time was 8 days and 12 hours.  Predominant rhythm was  NSR with average HR 79bpm (ranging from 34-169bpm).  There were 3 runs of nonsustained VT with longest/fastest lasting 8 beats at a rate of 169bpm  Rare SVE (<1%), rare VE (<1%)  Mobitz type I block seen during sleep  Patient triggered events correlated with NSR  No Afib, sustained arrhythmias or significant pauses   Patch Wear Time:  8 days and 12 hours (2022-07-11T21:01:27-0400 to 2022-07-20T09:54:00-0400)  Patient had a min HR of 34 bpm, max HR of 169 bpm, and avg HR of 79 bpm. Predominant underlying rhythm was Sinus Rhythm. First Degree AV Block was present. 3 Ventricular Tachycardia runs occurred, the run with the fastest interval lasting 8 beats with a max rate of 169 bpm (avg 130 bpm); the run with the fastest interval was also the longest. Second Degree AV Block-Mobitz I (Wenckebach) was present. Isolated SVEs were rare (<1.0%), and no SVE Couplets or SVE Triplets were present. Isolated VEs were rare (<1.0%, 170), VE Couplets were rare (<1.0%, 12), and VE Triplets were rare (<1.0%, 6). Ventricular Trigeminy was present.  Laurance Flatten, MD             Physical Exam:    VS:  BP (!) 130/90 (BP Location: Left Arm)   Pulse 74   Ht 5\' 9"  (1.753 m)   Wt 228 lb (103.4 kg)   SpO2 98%   BMI 33.67 kg/m    Wt Readings from Last 3 Encounters:  03/27/23 228 lb (103.4 kg)  10/24/22 222 lb (100.7 kg)  09/24/22 222 lb 12.8 oz (101.1 kg)    Gen: no distress, obese  Neck: No JVD Cardiac: No Rubs or Gallops, no murmur, RRR +radial pulses Respiratory: Clear to auscultation bilaterally, normal effort, normal  respiratory rate GI: Soft, nontender, non-distended  MS: No  edema;  moves all extremities Integument: Skin feels warm Neuro:  At time of evaluation, alert and oriented to person/place/time/situation  Psych: Normal affect, patient feels ok   ASSESSMENT AND PLAN: .    Hypertrophic Cardiomyopathy - NYHA II Diagnosed in 2023, nonobstructive type. Potential candidate for  gene therapy if MYBC3 gene mutation is found. Discussed experimental nature of gene therapy, performed in only 15 individuals, and its potential to alter gene expression to benefit cardiac function. - reviewed his genetic lineage from [employer]; 2023 - Submit for free genetic testing through [employer] - We have done pre-genetic counseling; will also provide post-genetic counseling if gene mutation is found - Discuss potential gene therapy if MYBC3 gene mutation  is found given he is s/p ICD - discussed disease state education and reviewed his cofounders for disease  Heart Failure with Reduced Ejection Fraction (chronic combined with LV recovery) Chronic heart failure with reduced ejection fraction, improved on goal-directed medical therapy. Requires ongoing management and medication refills. - Refill carvedilol - Refill Farxiga - Continue Entresto - Increase isosorbide mononitrate to 30 mg - euvolemic, continue GDMT  Severe Thoracic Aortic Aneurysm Severe thoracic aortic aneurysm measuring 50 mm. Not a surgical indication currently but requires close monitoring. Discussed need for immediate hospital visit if experiencing severe chest, back, or abdominal pain. Surgery considered if aneurysm reaches 55 mm or with family history of sudden cardiac death or aortic dissection. - Order echocardiogram to assess thoracic aortic aneurysm - Advise immediate hospital visit if severe chest, back, or abdominal pain occurs - Follow up with Dr. Morton Peters in the summer for imaging and risk assessment - discussed family resting - discussed levaquin and other medications - discussed BP control  Hypertension Longstanding hypertension with recent severe elevations (BP 216/117), contributing to lightheadedness, headaches, and palpitations. Requires aggressive management to prevent complications and improve symptoms. - Increase isosorbide mononitrate to 30 mg - Check blood pressures at home and  report in two weeks - Consider increasing spironolactone or hydralazine if blood pressure remains high - Refer to PharmD in one month for blood pressure optimization  Coronary Artery Disease Coronary artery disease with stable angina. Last percutaneous intervention in 2023. Currently on dual antiplatelet therapy. Discussed risk-benefit ratio of continuing dual antiplatelet therapy beyond one year, including slight decrease in risk of another blockage versus slight increase in risk of significant bleeding. - Stop Plavix (clopidogrel) - Continue aspirin - Consider stress test (PET MPI) if chest pain persists despite blood pressure control  Mitral and Aortic Regurgitation Mild to moderate mitral regurgitation and moderate aortic regurgitation. Requires monitoring. - Order echocardiogram to assess mitral and aortic regurgitation  Non-Sustained Ventricular Tachycardia Non-sustained ventricular tachycardia managed with ICD for secondary prevention. - Continue carvedilol - Monitor ICD function with Dr. Graciela Husbands - ICD is primary prevention (NSVT + LVEF < 50%)  Hyperlipidemia LDL under 55 on current medications. Last cholesterol check in 2023. - Order fasting lipid panel - Continue atorvastatin  Former Tobacco abuse No primary care physician currently. History of smoking from age 71 to 34. High stress levels due to work. - Establish care with a primary care physician - Encourage stress management techniques  Follow-up - Follow up with cardiology in 3-4 months - Follow up with PharmD in one month for blood pressure optimization - Monitor blood pressure at home and report in two weeks.  Riley Lam, MD FASE [facility] Cardiologist [facility]  [address] [place], [address] [place], [place] [address] [phone]  10:08 AM

## 2023-03-29 ENCOUNTER — Other Ambulatory Visit (HOSPITAL_COMMUNITY): Payer: Self-pay

## 2023-03-29 ENCOUNTER — Telehealth: Payer: Self-pay | Admitting: Pharmacy Technician

## 2023-03-29 NOTE — Telephone Encounter (Signed)
Pharmacy Patient Advocate Encounter  Received notification from [facility] that Prior Authorization for Marcelline Deist has been DENIED.  See denial reason below. No denial letter attached in CMM. Will attach denial letter to Media tab once received.   It appears Jardiance (empagliflozin) would be on formulary but it still needs a prior authorization. I called the insurance to confirm there is no preferred medication option.     PA #/Case ID/Reference #: [id number]

## 2023-03-29 NOTE — Telephone Encounter (Signed)
Pharmacy Patient Advocate Encounter   Received notification from Fax that prior authorization for FARXIGA 10MG  is required/requested.   Insurance verification completed.   The patient is insured through [facility] .   Per test claim: PA required; PA submitted to above mentioned insurance via CoverMyMeds Key/confirmation #/EOC [id number] Status is pending

## 2023-04-01 ENCOUNTER — Telehealth: Payer: Self-pay | Admitting: Pharmacy Technician

## 2023-04-01 ENCOUNTER — Other Ambulatory Visit (HOSPITAL_COMMUNITY): Payer: Self-pay

## 2023-04-01 NOTE — Telephone Encounter (Signed)
Pharmacy Patient Advocate Encounter  Received notification from  [employer]  that Prior Authorization for jardiance has been APPROVED from 04/01/23 to 03/30/24. Ran test claim, Copay is $30.00 one month. This test claim was processed through [facility]- copay amounts may vary at other pharmacies due to pharmacy/plan contracts, or as the patient moves through the different stages of their insurance plan.   PA #/Case ID/Reference #: [id number]

## 2023-04-01 NOTE — Telephone Encounter (Signed)
Pharmacy Patient Advocate Encounter   Received notification from RX Request Messages that prior authorization for jardiance is required/requested.   Insurance verification completed.   The patient is insured through  [employer]  .   Per test claim: PA required; PA submitted to above mentioned insurance via CoverMyMeds Key/confirmation #/EOC [facility] Status is pending

## 2023-04-04 ENCOUNTER — Telehealth: Payer: Self-pay | Admitting: Internal Medicine

## 2023-04-04 DIAGNOSIS — E785 Hyperlipidemia, unspecified: Secondary | ICD-10-CM

## 2023-04-04 DIAGNOSIS — I2510 Atherosclerotic heart disease of native coronary artery without angina pectoris: Secondary | ICD-10-CM

## 2023-04-04 MED ORDER — ATORVASTATIN CALCIUM 80 MG PO TABS: 80.0000 mg | ORAL_TABLET | Freq: Every day | ORAL | 3 refills | Status: DC

## 2023-04-04 NOTE — Telephone Encounter (Signed)
 The patient has been notified of the result and verbalized understanding.  All questions (if any) were answered. Hamp SAILOR Emori Kamau, RN 04/04/2023 2:33 PM   Pt reports has already increased med to 80 mg.  I have sent in new script and placed orders and released for draw in 3 months at [facility].

## 2023-04-04 NOTE — Telephone Encounter (Signed)
 Pt states he was returning a call today, I'm not sure if it was you or not. I see his Jardiance  was approved, he asked that [facility] be [facility] on [facility] for 90 days.

## 2023-04-04 NOTE — Telephone Encounter (Signed)
-----   Message from Stanly DELENA Leavens sent at 03/30/2023  1:31 PM EST ----- Regarding: FW: LDL is above goal on current therapy. Recommend increased to atorvastatin  80 mg PO daily and labs in three months. ----- Message ----- From: Interface, [facility] Lab Results In Sent: 03/27/2023   9:40 PM EST To: Stanly DELENA Leavens, MD

## 2023-04-12 ENCOUNTER — Ambulatory Visit (HOSPITAL_COMMUNITY): Payer: 59 | Attending: Internal Medicine

## 2023-04-12 DIAGNOSIS — I5042 Chronic combined systolic (congestive) and diastolic (congestive) heart failure: Secondary | ICD-10-CM | POA: Diagnosis not present

## 2023-04-12 DIAGNOSIS — I340 Nonrheumatic mitral (valve) insufficiency: Secondary | ICD-10-CM | POA: Insufficient documentation

## 2023-04-12 DIAGNOSIS — I2510 Atherosclerotic heart disease of native coronary artery without angina pectoris: Secondary | ICD-10-CM | POA: Diagnosis not present

## 2023-04-12 DIAGNOSIS — Z95810 Presence of automatic (implantable) cardiac defibrillator: Secondary | ICD-10-CM | POA: Insufficient documentation

## 2023-04-12 DIAGNOSIS — I351 Nonrheumatic aortic (valve) insufficiency: Secondary | ICD-10-CM | POA: Insufficient documentation

## 2023-04-12 DIAGNOSIS — I422 Other hypertrophic cardiomyopathy: Secondary | ICD-10-CM | POA: Insufficient documentation

## 2023-04-12 DIAGNOSIS — I7121 Aneurysm of the ascending aorta, without rupture: Secondary | ICD-10-CM | POA: Diagnosis not present

## 2023-04-12 DIAGNOSIS — I428 Other cardiomyopathies: Secondary | ICD-10-CM | POA: Diagnosis not present

## 2023-04-12 DIAGNOSIS — I10 Essential (primary) hypertension: Secondary | ICD-10-CM | POA: Insufficient documentation

## 2023-04-12 LAB — ECHOCARDIOGRAM COMPLETE
Area-P 1/2: 3.91 cm2
S' Lateral: 2.6 cm

## 2023-04-12 NOTE — Progress Notes (Unsigned)
Consulted with Dr.Nishan (DOD) in reference to ascending aorta.

## 2023-04-13 ENCOUNTER — Encounter: Payer: Self-pay | Admitting: Internal Medicine

## 2023-04-13 DIAGNOSIS — I2510 Atherosclerotic heart disease of native coronary artery without angina pectoris: Secondary | ICD-10-CM

## 2023-04-13 DIAGNOSIS — I422 Other hypertrophic cardiomyopathy: Secondary | ICD-10-CM

## 2023-04-13 DIAGNOSIS — I7121 Aneurysm of the ascending aorta, without rupture: Secondary | ICD-10-CM

## 2023-04-13 DIAGNOSIS — I5042 Chronic combined systolic (congestive) and diastolic (congestive) heart failure: Secondary | ICD-10-CM

## 2023-04-13 DIAGNOSIS — E785 Hyperlipidemia, unspecified: Secondary | ICD-10-CM

## 2023-04-15 NOTE — Telephone Encounter (Signed)
 The patient has been notified of the result and verbalized understanding.  All questions (if any) were answered. Thomas Burows, RN 04/15/2023 4:01 PM   Pt would like Cardiac MRI instructions sent to him via My chart.  Advised him to call our office or send a mychart message if he has questions.

## 2023-04-15 NOTE — Telephone Encounter (Signed)
-----   Message from Christell Constant sent at 04/13/2023  3:22 PM EST ----- Results: Severe LVH with evidence high risk feature (septum 30 mm+) Mild AI Severe aortic dilation (55 mm) Plan: CMR and MRA  Christell Constant, MD

## 2023-04-26 ENCOUNTER — Telehealth: Payer: Self-pay | Admitting: Internal Medicine

## 2023-04-26 MED ORDER — EMPAGLIFLOZIN 10 MG PO TABS: 10.0000 mg | ORAL_TABLET | Freq: Every day | ORAL | 11 refills | Status: AC

## 2023-04-26 NOTE — Telephone Encounter (Signed)
 Pt c/o medication issue:  1. Name of Medication:  Jardiance   2. How are you currently taking this medication (dosage and times per day)?   3. Are you having a reaction (difficulty breathing--STAT)?   4. What is your medication issue?   Patient is following up regarding Jardiance. Looks like it is approved through 03/2024. Patient would like to have it sent to his pharmacy ASAP because he's almost out of [place].

## 2023-04-26 NOTE — Telephone Encounter (Signed)
 Sent Jardiance 10 mg by mouth daily to patient's pharmacy per Dr. Izora Ribas.

## 2023-04-26 NOTE — Telephone Encounter (Signed)
 Tired to call patient back about message.

## 2023-04-26 NOTE — Telephone Encounter (Signed)
 Patient called back. Informed patient that a message was sent to Dr. Izora Ribas to clarify dosage. Patient stated he has enough farxiga until Monday. Informed patient once we have clarification, we will send to his pharmacy. Patient agreed to plan.

## 2023-05-03 ENCOUNTER — Encounter: Payer: 59 | Attending: Cardiology

## 2023-05-03 DIAGNOSIS — I340 Nonrheumatic mitral (valve) insufficiency: Secondary | ICD-10-CM | POA: Insufficient documentation

## 2023-05-03 DIAGNOSIS — I351 Nonrheumatic aortic (valve) insufficiency: Secondary | ICD-10-CM | POA: Insufficient documentation

## 2023-05-03 DIAGNOSIS — I5042 Chronic combined systolic (congestive) and diastolic (congestive) heart failure: Secondary | ICD-10-CM | POA: Insufficient documentation

## 2023-05-03 DIAGNOSIS — I4729 Other ventricular tachycardia: Secondary | ICD-10-CM | POA: Insufficient documentation

## 2023-05-03 DIAGNOSIS — Z95810 Presence of automatic (implantable) cardiac defibrillator: Secondary | ICD-10-CM | POA: Insufficient documentation

## 2023-05-03 DIAGNOSIS — I422 Other hypertrophic cardiomyopathy: Secondary | ICD-10-CM | POA: Insufficient documentation

## 2023-05-03 DIAGNOSIS — I10 Essential (primary) hypertension: Secondary | ICD-10-CM | POA: Insufficient documentation

## 2023-05-03 DIAGNOSIS — I2510 Atherosclerotic heart disease of native coronary artery without angina pectoris: Secondary | ICD-10-CM | POA: Insufficient documentation

## 2023-05-03 DIAGNOSIS — I428 Other cardiomyopathies: Secondary | ICD-10-CM | POA: Insufficient documentation

## 2023-05-03 DIAGNOSIS — I7121 Aneurysm of the ascending aorta, without rupture: Secondary | ICD-10-CM | POA: Insufficient documentation

## 2023-05-05 LAB — CUP PACEART REMOTE DEVICE CHECK
Battery Remaining Longevity: 168 mo
Battery Remaining Percentage: 100 %
Brady Statistic RV Percent Paced: 0 %
Date Time Interrogation Session: 20250307020100
HighPow Impedance: 80 Ohm
Implantable Lead Connection Status: 753985
Implantable Lead Implant Date: 20230906
Implantable Lead Location: 753860
Implantable Lead Model: 673
Implantable Lead Serial Number: 201402
Implantable Pulse Generator Implant Date: 20230906
Lead Channel Impedance Value: 468 Ohm
Lead Channel Pacing Threshold Amplitude: 0.7 V
Lead Channel Pacing Threshold Pulse Width: 0.4 ms
Lead Channel Setting Pacing Amplitude: 2.5 V
Lead Channel Setting Pacing Pulse Width: 0.4 ms
Lead Channel Setting Sensing Sensitivity: 0.5 mV
Pulse Gen Serial Number: 316727
Zone Setting Status: 755011

## 2023-05-06 ENCOUNTER — Encounter (HOSPITAL_COMMUNITY): Payer: Self-pay

## 2023-05-13 ENCOUNTER — Encounter: Payer: Self-pay | Admitting: Pharmacist

## 2023-05-13 ENCOUNTER — Encounter: Payer: Self-pay | Admitting: *Deleted

## 2023-05-13 ENCOUNTER — Ambulatory Visit: Payer: 59 | Attending: Cardiovascular Disease | Admitting: Pharmacist

## 2023-05-13 VITALS — BP 145/110 | HR 78

## 2023-05-13 DIAGNOSIS — I10 Essential (primary) hypertension: Secondary | ICD-10-CM

## 2023-05-13 MED ORDER — AMLODIPINE BESYLATE 5 MG PO TABS: 5.0000 mg | ORAL_TABLET | Freq: Every day | ORAL | 3 refills | Status: DC

## 2023-05-13 NOTE — Progress Notes (Signed)
 Patient ID: Thomas Spears                 DOB: [birth year]                      MRN: [id number]      HPI: Thomas Spears is a 48 y.o. male referred by Dr. Izora Ribas to HTN clinic. PMH is significant for heart failure with reduced ejection fraction and hypertrophic cardiomyopathy diagnosed in 2023,Coronary artery disease with stable angina. Last percutaneous intervention in 2023- on ASA ( Plavix d/c Jan 2025) hypertension, mitral and Aortic regurgitation, non- sustained VT - ICD managed by Dr.Klein, HLD, former tobacco user.   At  last visit with Dr. 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 Imdur dose was increased from 15 mg daily to 30 mg daily.   Patient presented today for hypertension follow up. Reports he tolerates Imdur increased dose well. He does not check BP at home from last few week because his home BP monitor broke. He add salt to his food when he cooks but try to avoid salty snacks. He has not started taking Jardiance yet, did not get time to pick up from pharmacy. He walks a lot at work but does not have any other exercise regimen that he follows.   Current HTN meds: Carvedilol 37.5 mg twice daily, hydralazin 25 mg three times daily, spironolactone 25 mg daily, Entresto 97-103 mg twice daily, Imdur 30 mg daily   Previously tried/ intolerance: none  BP goal: <130/80   Family History:  Relation Problem Comments  Mother (Deceased)   Father (Deceased) CAD      Social History:  Smoking: none  Alcohol: none  Recreational drug use: none   Diet: try to avoid salty snacks. Still cook with salt  Snacks- mainly fruits, chips - once week    Exercise:  Walks a lot at work but he does not have any exercise regimen  Suggest to do moderate  intensity exercise ( brisk walking)  at least for 150 min per week   Home BP readings: does not check at home- meter broke    Wt Readings from Last 3 Encounters:  03/27/23 228 lb (103.4 kg)  10/24/22 222 lb (100.7 kg)  09/24/22 222 lb 12.8 oz (101.1 kg)   BP Readings from Last 3 Encounters:  05/13/23 (!) 145/110  03/27/23 (!) 130/90  12/10/22 (!) 184/128   Pulse Readings from Last 3 Encounters:  05/13/23 78  03/27/23 74  12/10/22 95    Renal function: CrCl cannot be calculated (Patient's most recent lab result is older than the maximum 21 days allowed.).  Past Medical History:  Diagnosis Date   Aortic regurgitation    Ascending aorta dilation (HCC)    CAD (coronary artery disease), native coronary artery 09/20/2021   Chronic combined systolic and diastolic CHF (congestive heart failure) (HCC)    Community acquired pneumonia    HLD (hyperlipidemia)    Hypertension    Hypertensive crisis 05/22/2020   Hypertrophic cardiomyopathy (HCC)    Left ventricular hypertrophy    Mitral regurgitation    Mobitz type 1 second degree AV block    NSVT (nonsustained ventricular tachycardia) (HCC)    Premature atrial contractions    PVC's (premature ventricular contractions)  Tension headache 06/21/2020   Tobacco use    Tobacco use 09/20/2021    Current Outpatient Medications on File Prior to Visit  Medication Sig Dispense Refill   acetaminophen (TYLENOL) 500 MG tablet Take 2 tablets (1,000 mg total) by mouth every 8 (eight) hours as needed for up to 30 doses for mild pain or fever. 60 tablet 0   aspirin EC 81 MG tablet Take 1 tablet (81 mg total) by mouth every evening. Swallow whole. 30 tablet 3   atorvastatin (LIPITOR) 80 MG tablet Take 1 tablet (80 mg total) by mouth daily. 90 tablet 3   carvedilol (COREG) 25 MG tablet Take 1.5 tablets (37.5 mg total) by mouth 2 (two) times daily. 270 tablet 3   empagliflozin (JARDIANCE) 10 MG TABS tablet Take 1 tablet (10 mg total) by mouth  daily before breakfast. 30 tablet 11   hydrALAZINE (APRESOLINE) 25 MG tablet Take 1 tablet (25 mg total) by mouth 3 (three) times daily. 270 tablet 2   isosorbide mononitrate (IMDUR) 30 MG 24 hr tablet Take 1 tablet (30 mg total) by mouth daily. 90 tablet 3   pantoprazole (PROTONIX) 40 MG tablet Take 1 tablet (40 mg total) by mouth daily. 30 tablet 7   sacubitril-valsartan (ENTRESTO) 97-103 MG Take 1 tablet by mouth 2 (two) times daily. 180 tablet 3   spironolactone (ALDACTONE) 25 MG tablet Take 1 tablet (25 mg total) by mouth daily. 90 tablet 2   No current facility-administered medications on file prior to visit.    Allergies  Allergen Reactions   Mushroom Extract Complex (Obsolete) Anaphylaxis   Levaquin [Levofloxacin]     History of aortic aneurysm     Blood pressure (!) 145/110, pulse 78, SpO2 95%.   Assessment/Plan:  1. Hypertension -  Essential hypertension Assessment: BP is uncontrolled in office BP 145/110 mmHg heart rate 78 (goal <130/80) Takes and Tolerates current medications well without any side effects Denies SOB, palpitation, chest pain, headaches,or swelling Reiterated the importance of regular exercise and low salt diet  Will add CCBs to current BP/HF meds   Plan:  Start taking amlodipine 5 mg daily  Continue taking Carvedilol 37.5 mg twice daily, hydralazin 25 mg three times daily, spironolactone 25 mg daily, Entresto 97-103 mg twice daily, Imdur 30 mg daily  In future can if BP remain elevated can up the amlodipine dose or hydralazine dose  Patient to keep record of BP readings with heart rate and report to [place] at the next visit Patient to see PharmD in  weeks 5-6 for follow up  Follow up lab(s): none    Thank you  Carmela Hurt, Pharm.D  [facility] [address] [place], [place] [address]  Phone: [phone]; Fax: [phone]

## 2023-05-13 NOTE — Patient Instructions (Addendum)
 Changes made by your pharmacist Carmela Hurt, PharmD at today's visit:    Instructions/Changes  (what do you need to do) Your Notes  (what you did and when you did it)  Start taking amlodipine 5 mg daily, Jardiance 10 mg daily continue taking  Carvedilol 37.5 mg twice daily, hydralazin 25 mg three times daily, spironolactone 25 mg daily, Entresto 97-103 mg twice daily, Imdur 30 mg daily    Lower salt intake   Start doing moderate intensity exercise like brisk walks at least for 150 min per week     Bring all of your meds, your BP cuff and your record of home blood pressures to your next appointment.    HOW TO TAKE YOUR BLOOD PRESSURE AT HOME  Rest 5 minutes before taking your blood pressure.  Don't smoke or drink caffeinated beverages for at least 30 minutes before. Take your blood pressure before (not after) you eat. Sit comfortably with your back supported and both feet on the floor (don't cross your legs). Elevate your arm to heart level on a table or a desk. Use the proper sized cuff. It should fit smoothly and snugly around your bare upper arm. There should be enough room to slip a fingertip under the cuff. The bottom edge of the cuff should be 1 inch above the crease of the elbow. Ideally, take 3 measurements at one sitting and record the average.  Important lifestyle changes to control high blood pressure  Intervention  Effect on the BP  Lose extra pounds and watch your waistline Weight loss is one of the most effective lifestyle changes for controlling blood pressure. If you're overweight or obese, losing even a small amount of weight can help reduce blood pressure. Blood pressure might go down by about 1 millimeter of mercury (mm Hg) with each kilogram (about 2.2 pounds) of weight lost.  Exercise regularly As a general goal, aim for at least 30 minutes of moderate physical activity every day. Regular physical activity can lower high blood pressure by about 5 to 8 mm Hg.  Eat  a healthy diet Eating a diet rich in whole grains, fruits, vegetables, and low-fat dairy products and low in saturated fat and cholesterol. A healthy diet can lower high blood pressure by up to 11 mm Hg.  Reduce salt (sodium) in your diet Even a small reduction of sodium in the diet can improve heart health and reduce high blood pressure by about 5 to 6 mm Hg.  Limit alcohol One drink equals 12 ounces of beer, 5 ounces of wine, or 1.5 ounces of 80-proof liquor.  Limiting alcohol to less than one drink a day for women or two drinks a day for men can help lower blood pressure by about 4 mm Hg.   If you have any questions or concerns please use My Chart to send questions or call the office at [phone]

## 2023-05-13 NOTE — Assessment & Plan Note (Addendum)
 Assessment: BP is uncontrolled in office BP 145/110 mmHg heart rate 78 (goal <130/80) Takes and Tolerates current medications well without any side effects Denies SOB, palpitation, chest pain, headaches,or swelling Reiterated the importance of regular exercise and low salt diet  Will add CCBs to current BP/HF meds   Plan:  Start taking amlodipine 5 mg daily  Continue taking Carvedilol 37.5 mg twice daily, hydralazin 25 mg three times daily, spironolactone 25 mg daily, Entresto 97-103 mg twice daily, Imdur 30 mg daily  In future can if BP remain elevated can up the amlodipine dose or hydralazine dose  Patient to keep record of BP readings with heart rate and report to [place] at the next visit Patient to see PharmD in  weeks 5-6 for follow up  Follow up lab(s): none

## 2023-05-18 ENCOUNTER — Encounter: Payer: Self-pay | Admitting: Internal Medicine

## 2023-06-03 ENCOUNTER — Other Ambulatory Visit (HOSPITAL_COMMUNITY): Payer: Self-pay

## 2023-06-03 ENCOUNTER — Encounter: Payer: Self-pay | Admitting: Internal Medicine

## 2023-06-05 NOTE — Addendum Note (Signed)
 Addended by: Elease Etienne A on: 06/05/2023 10:27 AM   Modules accepted: Orders

## 2023-06-05 NOTE — Progress Notes (Signed)
 Remote ICD transmission.

## 2023-06-10 ENCOUNTER — Encounter: Payer: Self-pay | Admitting: Internal Medicine

## 2023-06-10 ENCOUNTER — Encounter (HOSPITAL_COMMUNITY): Payer: Self-pay

## 2023-06-12 ENCOUNTER — Other Ambulatory Visit: Payer: Self-pay | Admitting: Internal Medicine

## 2023-06-12 ENCOUNTER — Encounter: Payer: Self-pay | Admitting: Internal Medicine

## 2023-06-12 ENCOUNTER — Ambulatory Visit (HOSPITAL_COMMUNITY)
Admission: RE | Admit: 2023-06-12 | Discharge: 2023-06-12 | Disposition: A | Discharge status: Home / Self Care | Source: Ambulatory Visit | Attending: Internal Medicine | Admitting: Internal Medicine

## 2023-06-12 DIAGNOSIS — I2510 Atherosclerotic heart disease of native coronary artery without angina pectoris: Secondary | ICD-10-CM

## 2023-06-12 DIAGNOSIS — I422 Other hypertrophic cardiomyopathy: Secondary | ICD-10-CM | POA: Insufficient documentation

## 2023-06-12 DIAGNOSIS — I7121 Aneurysm of the ascending aorta, without rupture: Secondary | ICD-10-CM

## 2023-06-12 DIAGNOSIS — I5042 Chronic combined systolic (congestive) and diastolic (congestive) heart failure: Secondary | ICD-10-CM

## 2023-06-12 DIAGNOSIS — E785 Hyperlipidemia, unspecified: Secondary | ICD-10-CM

## 2023-06-12 MED ORDER — GADOBUTROL 1 MMOL/ML IV SOLN
10.0000 mL | Freq: Once | INTRAVENOUS | Status: AC | PRN
  Administered 2023-06-12: 10 mL via INTRAVENOUS

## 2023-06-13 ENCOUNTER — Telehealth: Payer: Self-pay | Admitting: Internal Medicine

## 2023-06-13 ENCOUNTER — Encounter: Payer: Self-pay | Admitting: Internal Medicine

## 2023-06-13 NOTE — Telephone Encounter (Signed)
 Duplicate encounter. Please see my chart encounter from 4/16

## 2023-06-13 NOTE — Telephone Encounter (Signed)
 Pt calling in regards to CT. Please advise

## 2023-06-18 ENCOUNTER — Encounter: Payer: Self-pay | Admitting: Internal Medicine

## 2023-06-18 ENCOUNTER — Ambulatory Visit: Attending: Internal Medicine | Admitting: Internal Medicine

## 2023-06-18 VITALS — BP 130/90 | HR 70 | Ht 68.0 in | Wt 226.0 lb

## 2023-06-18 DIAGNOSIS — I10 Essential (primary) hypertension: Secondary | ICD-10-CM

## 2023-06-18 DIAGNOSIS — I7121 Aneurysm of the ascending aorta, without rupture: Secondary | ICD-10-CM

## 2023-06-18 DIAGNOSIS — I2510 Atherosclerotic heart disease of native coronary artery without angina pectoris: Secondary | ICD-10-CM

## 2023-06-18 DIAGNOSIS — I422 Other hypertrophic cardiomyopathy: Secondary | ICD-10-CM

## 2023-06-18 DIAGNOSIS — E785 Hyperlipidemia, unspecified: Secondary | ICD-10-CM

## 2023-06-18 MED ORDER — HYDRALAZINE HCL 25 MG PO TABS: 25.0000 mg | ORAL_TABLET | Freq: Three times a day (TID) | ORAL | 1 refills | Status: DC

## 2023-06-18 NOTE — H&P (View-Only) (Signed)
 Cardiology Office Note:  .    Date:  06/18/2023  ID:  Thomas Spears, DOB 12/13/1975, MRN [id number] PCP: Patient, No Pcp Per  [facility] Providers Cardiologist:  Jann Melody, MD     CC: Results follow up  History of Present Illness: .    Thomas Spears is a 48 y.o. 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  He has a history of hypertrophic nonobstructive cardiomyopathy with a maximal septal thickness of 31 mm and is status post-implantation of an implantable cardioverter-defibrillator (ICD) due to the risk of arrhythmias associated with myocardial scarring. The ICD has never delivered a shock.  He has heart failure with reduced ejection fraction. A recent MRI revealed significant scar burden and a severely dilated aortic root with moderate aortic regurgitation. He is asymptomatic, with no chest pain or shortness of breath.  He underwent a heart catheterization in 2023, which did not reveal any blockages.  He works in a [employer] and is the sole provider for his household. He does not smoke and is concerned about the potential need for surgery and its impact on his ability to work.  ROS: As per HPI.   Studies Reviewed: .   Cardiac Studies & Procedures   ______________________________________________________________________________________________ CARDIAC CATHETERIZATION  CARDIAC CATHETERIZATION 09/20/2021  Conclusion 1.  Patent left main, LAD, and left circumflex, with mild nonobstructive plaquing 2.  Severe distal RCA stenosis with positive RFR of 0.78, treated successfully with PCI using a 3.5 x 24 mm Synergy DES deployed over the distal RCA and extending into the posterolateral branch  Recommendations: Continued risk reduction measures and GDMT for heart failure, aspirin  and clopidogrel  x 6 months without interruption, okay for discharge this evening  as long as criteria met.  Findings Coronary Findings Diagnostic  Dominance: Right  Left Main Vessel is large. Mid LM to Dist LM lesion is 20% stenosed. The lesion is focal and concentric.  Left Anterior Descending There is mild diffuse disease throughout the vessel. Ost LAD to Prox LAD lesion is 30% stenosed. The lesion is focal and concentric. Mid LAD lesion is 45% stenosed. The lesion is located at the bifurcation and eccentric.  Second Diagonal Branch Vessel is small in size.  Third Diagonal Branch Vessel is small in size.  Left Circumflex Vessel is large. The vessel exhibits minimal luminal irregularities.  First Obtuse Marginal Branch Vessel is small in size. Vessel is angiographically normal.  Second Obtuse Marginal Branch Vessel is small in size.  First Left Posterolateral Branch Vessel is small in size.  Left Posterior Atrioventricular Artery Vessel is small in size.  Right Coronary Artery Vessel is large. There is moderate focal disease in the vessel. The vessel exhibits minimal luminal irregularities. The vessel is moderately tortuous. Prox RCA lesion is 30% stenosed. The lesion is eccentric. Dist RCA lesion is 75% stenosed. The lesion is discrete and concentric.  Acute Marginal Branch Vessel is small in size.  Right Ventricular Branch Vessel is small in size.  First Right Posterolateral Branch Vessel is moderate in size.  Second Right Posterolateral Branch Vessel is large in size.  Intervention  Dist RCA lesion Stent CATH LAUNCHER 6FR JR4 guide catheter was inserted. Lesion crossed with guidewire using a WIRE COUGAR XT STRL 190CM. Pre-stent angioplasty was performed using a BALLN SAPPHIRE 3.0X15. A drug-eluting stent was successfully placed using a SYNERGY XD 3.50X24. Post-stent angioplasty was performed using a BALL SAPPHIRE NC24 3.75X15. Maximum pressure:  16 atm. Post-Intervention Lesion Assessment The intervention was successful.  Pre-interventional TIMI flow is 3. Post-intervention TIMI flow is 3. No complications occurred at this lesion. There is a 0% residual stenosis post intervention.   CARDIAC CATHETERIZATION  CARDIAC CATHETERIZATION 08/31/2020  Conclusion  LV end diastolic pressure is moderately elevated.  There is no aortic valve stenosis.  Mid LM to Dist LM lesion is 20% stenosed.  Ost LAD to Prox LAD lesion is 30% stenosed.  Mid LAD lesion is 45% stenosed @ 1st Diag (40% pre & 50% post 1st Diag)  Prox RCA lesion is 30% stenosed.  Dist RCA lesion is 60% stenosed - discrete focal lesion, not flow limiting.  No evidence of stent.  SUMMARY  Diffuse mild to moderate multivessel disease but no obvious culprit lesion to explain significant reduced EF.  Unable to visualize any previously placed stents.  Moderately elevated LVEDP   RECOMMENDATIONS  Continue to titrate GDMT for nonischemic CM. ->  Anticipate titrating up carvedilol  to 12.5 mg twice daily and further titration of Entresto .  May also consider spironolactone    Randene Bustard, MD  Findings Coronary Findings Diagnostic  Dominance: Right  Left Main Vessel was injected. Vessel is large. Mid LM to Dist LM lesion is 20% stenosed. The lesion is focal and concentric.  Left Anterior Descending Ost LAD to Prox LAD lesion is 30% stenosed. The lesion is focal and concentric. Mid LAD lesion is 45% stenosed. The lesion is located at the bifurcation and eccentric.  Second Diagonal Branch Vessel is small in size.  Third Diagonal Branch Vessel is small in size.  Left Circumflex Vessel is large. Vessel is angiographically normal.  First Obtuse Marginal Branch Vessel is small in size. Vessel is angiographically normal.  Second Obtuse Marginal Branch Vessel is small in size.  First Left Posterolateral Branch Vessel is small in size.  Left Posterior Atrioventricular Artery Vessel is small in size.  Right Coronary  Artery Vessel was injected. Vessel is large. There is moderate focal disease in the vessel. The vessel exhibits minimal luminal irregularities. The vessel is moderately tortuous. Prox RCA lesion is 30% stenosed. The lesion is eccentric. Dist RCA lesion is 60% stenosed. The lesion is discrete and concentric.  Acute Marginal Branch Vessel is small in size.  Right Ventricular Branch Vessel is small in size.  First Right Posterolateral Branch Vessel is moderate in size.  Second Right Posterolateral Branch Vessel is large in size.  Intervention  No interventions have been documented.     ECHOCARDIOGRAM  ECHOCARDIOGRAM COMPLETE 04/12/2023  Narrative ECHOCARDIOGRAM REPORT    Patient Name:   Thomas Spears Date of Exam: 04/12/2023 Medical Rec #:  [id number]        Height:       69.0 in Accession #:    [id number]       Weight:       228.0 lb Date of Birth:  1975-08-29        BSA:          2.184 m Patient Age:    47 years         BP:           138/93 mmHg Patient Gender: M                HR:           71 bpm. Exam Location:  [address]  Procedure: 2D Echo and Strain Analysis (Both Spectral and Color Flow Doppler were utilized during procedure).  Indications:    I42.2 HCM; I35.1 Nonrheumatic aortic (valve) insufficiency; I34.0 Nonrheumatic mitral (valve) insufficiency; I71.2 Ascending aortic aneurysm  History:        Patient has prior history of Echocardiogram examinations, most recent 03/20/2021. CHF, CAD, Defibrillator; Risk Factors:Hypertension, Dyslipidemia and Former Smoker. NSVT. NICM.  Sonographer:    Mylinda Asa RCS Referring Phys: [facility] Miki Alert   Sonographer Comments: Consulted with Dr. Stann Earnest (DOD), in reference to ascending aorta. IMPRESSIONS   1. There is severe concentric LVH with septal prominence (septum 3.5cm and PW 2.6cm). There is no significant LVOT gradient at rest or with Valsalva. Left ventricular ejection fraction, by estimation, is  70 to 75%. The left ventricle has hyperdynamic function. The left ventricle has no regional wall motion abnormalities. There is severe concentric left ventricular hypertrophy. Left ventricular diastolic parameters are consistent with Grade I diastolic dysfunction (impaired relaxation). The average left ventricular global longitudinal strain is -24.1 %. The global longitudinal strain is normal. 2. Right ventricular systolic function is normal. The right ventricular size is normal. 3. Left atrial size was mildly dilated. 4. The mitral valve is normal in structure. Trivial mitral valve regurgitation. No evidence of mitral stenosis. 5. The aortic valve is normal in structure. Aortic valve regurgitation is mild. No aortic stenosis is present. 6. Aortic dilatation noted. There is severe dilatation of the aortic root, measuring 55 mm. There is moderate dilatation of the ascending aorta, measuring 45 mm. 7. The inferior vena cava is normal in size with greater than 50% respiratory variability, suggesting right atrial pressure of 3 mmHg.  FINDINGS Left Ventricle: There is severe concentric LVH with septal prominence (septum 3.5cm and PW 2.6cm). There is no significant LVOT gradient at rest or with Valsalva. Left ventricular ejection fraction, by estimation, is 70 to 75%. The left ventricle has hyperdynamic function. The left ventricle has no regional wall motion abnormalities. The average left ventricular global longitudinal strain is -24.1 %. Strain was performed and the global longitudinal strain is normal. The left ventricular internal cavity size was normal in size. There is severe concentric left ventricular hypertrophy. Left ventricular diastolic parameters are consistent with Grade I diastolic dysfunction (impaired relaxation).  Right Ventricle: The right ventricular size is normal. No increase in right ventricular wall thickness. Right ventricular systolic function is normal.  Left Atrium: Left  atrial size was mildly dilated.  Right Atrium: Right atrial size was normal in size.  Pericardium: There is no evidence of pericardial effusion.  Mitral Valve: The mitral valve is normal in structure. Trivial mitral valve regurgitation. No evidence of mitral valve stenosis.  Tricuspid Valve: The tricuspid valve is normal in structure. Tricuspid valve regurgitation is trivial. No evidence of tricuspid stenosis.  Aortic Valve: The aortic valve is normal in structure. Aortic valve regurgitation is mild. No aortic stenosis is present.  Pulmonic Valve: The pulmonic valve was normal in structure. Pulmonic valve regurgitation is trivial. No evidence of pulmonic stenosis.  Aorta: Aortic dilatation noted. There is severe dilatation of the aortic root, measuring 55 mm. There is moderate dilatation of the ascending aorta, measuring 45 mm.  Venous: The inferior vena cava is normal in size with greater than 50% respiratory variability, suggesting right atrial pressure of 3 mmHg.  IAS/Shunts: No atrial level shunt detected by color flow Doppler.  Additional Comments: 3D imaging was not performed.   LEFT VENTRICLE PLAX 2D LVIDd:         3.80 cm   Diastology LVIDs:  2.60 cm   LV e' medial:    5.10 cm/s LV PW:         2.60 cm   LV E/e' medial:  11.7 LV IVS:        3.50 cm   LV e' lateral:   3.81 cm/s LVOT diam:     2.30 cm   LV E/e' lateral: 15.7 LV SV:         74 LV SV Index:   34        2D Longitudinal Strain LVOT Area:     4.15 cm  2D Strain GLS Avg:     -24.1 %   RIGHT VENTRICLE RV Basal diam:  4.00 cm RV S prime:     15.90 cm/s TAPSE (M-mode): 2.4 cm  LEFT ATRIUM             Index        RIGHT ATRIUM           Index LA diam:        4.50 cm 2.06 cm/m   RA Area:     17.50 cm LA Vol (A2C):   50.6 ml 23.17 ml/m  RA Volume:   46.10 ml  21.11 ml/m LA Vol (A4C):   49.9 ml 22.84 ml/m LA Biplane Vol: 49.9 ml 22.84 ml/m AORTIC VALVE LVOT Vmax:   112.00 cm/s LVOT Vmean:  64.600  cm/s LVOT VTI:    0.179 m  AORTA Ao Root diam: 4.40 cm Ao Asc diam:  4.50 cm  MITRAL VALVE MV Area (PHT): 3.91 cm    SHUNTS MV Decel Time: 194 msec    Systemic VTI:  0.18 m MV E velocity: 59.70 cm/s  Systemic Diam: 2.30 cm MV A velocity: 87.50 cm/s MV E/A ratio:  0.68  Jules Oar MD Electronically signed by Jules Oar MD Signature Date/Time: 04/12/2023/11:58:35 AM    Final    MONITORS  LONG TERM MONITOR (3-14 DAYS) 09/20/2020  Narrative  Patch wear time was 8 days and 12 hours.  Predominant rhythm was NSR with average HR 79bpm (ranging from 34-169bpm).  There were 3 runs of nonsustained VT with longest/fastest lasting 8 beats at a rate of 169bpm  Rare SVE (<1%), rare VE (<1%)  Mobitz type I block seen during sleep  Patient triggered events correlated with NSR  No Afib, sustained arrhythmias or significant pauses   Patch Wear Time:  8 days and 12 hours (2022-07-11T21:01:27-0400 to 2022-07-20T09:54:00-0400)  Patient had a min HR of 34 bpm, max HR of 169 bpm, and avg HR of 79 bpm. Predominant underlying rhythm was Sinus Rhythm. First Degree AV Block was present. 3 Ventricular Tachycardia runs occurred, the run with the fastest interval lasting 8 beats with a max rate of 169 bpm (avg 130 bpm); the run with the fastest interval was also the longest. Second Degree AV Block-Mobitz I (Wenckebach) was present. Isolated SVEs were rare (<1.0%), and no SVE Couplets or SVE Triplets were present. Isolated VEs were rare (<1.0%, 170), VE Couplets were rare (<1.0%, 12), and VE Triplets were rare (<1.0%, 6). Ventricular Trigeminy was present.  Riccardo Chamberlain, MD     CARDIAC MRI  MR CARDIAC MORPHOLOGY W WO CONTRAST 06/12/2023  Narrative CLINICAL DATA:  Clinical question of hypertrophic cardiomyopathy Study assumes BSA of 2.24 m2.  EXAM: CARDIAC MRI  TECHNIQUE: The patient was scanned on a 1.5 Tesla GE magnet. A dedicated cardiac coil was used. Functional  imaging was done using Fiesta sequences. 2,3, and 4  chamber views were done to assess for RWMA's. Modified Simpson's rule using was used to calculate an ejection fraction on a dedicated work [employer]. The patient received 10 cc of Gadavist . After 10 minutes inversion recovery sequences were used to assess for infiltration and scar tissue. Flow quantification was performed 2 times during this examination with flow quantification performed at the levels of the ascending aorta above the valve, pulmonary artery above the valve.  CONTRAST:  10 cc  of Gadavist   FINDINGS: 1. Mild dilation of left ventricular size, with LVEDD 58 mm, but LVEDVi 114 mL/m2.  Study suggestive of hypertrophic cardiomyopathy. No LVOT obstruction. Sigmoid septum. Severe hypertrophy with maximal septal thickness 31 mm.  Low normal left ventricular systolic function (LVEF =51%). There are no regional wall motion abnormalities.  There is late gadolinium enhancement in the left ventricular myocardium. Patchy LGE that predominates on the anterolateral LV myocardium. Device lead artifact.  2. Normal right ventricular size with RVEDVI 74 mL/m2.  Normal right ventricular thickness.  Normal right ventricular systolic function (RVEF =51%). There are no regional wall motion abnormalities or aneurysms.  There is a device lead seen in the left atrium and left ventricle. Course is not well evaluated in this study.  3.  Normal left atrial size.  Mild right atrial dilation.  4. Moderate ascending aortic aneurysm 49 mm. Severe root dilation, 55 mm. Root is not well visualized. Normal main pulmonary artery caliber.  5. Valve assessment:  Aortic Valve: Valve morphology not well assessed. There is moderate regurgitation. Regurgitant fraction 26%.  Pulmonic Valve: There is moderate regurgitation. Regurgitant fraction 22%.  Tricuspid Valve: No significant regurgitation.  Mitral Valve: No  significant regurgitation.  6.  Normal pericardium.  No pericardial effusion.  7. Grossly, no extracardiac findings. Recommended dedicated study if concerned for non-cardiac pathology.  8. Device lead artifacts noted. Unable to quantify LGE. Unable to perform parametric mapping.  IMPRESSION: 1. Study suggestive of hypertrophic cardiomyopathy with maximal thickness 31 mm. Parametric mapping not well visualized due to device lead artifact. LGE qualitatively noted. CIED lead noted.  2. Low normal left ventricular systolic function (LVEF =51%) with mild dilation of left ventricular size my volume.  3. Severe aortic root aneurysm (55 mm), with moderate ascending aortic dilation (49 mm) and moderate aortic regurgitation.  Gloriann Larger MD   Electronically Signed By: Gloriann Larger M.D. On: 06/12/2023 17:30   ______________________________________________________________________________________________       Physical Exam:    VS:  BP (!) 130/90 (BP Location: Left Arm)   Pulse 70   Ht 5\' 8"  (1.727 m)   Wt 102.5 kg   SpO2 93%   BMI 34.36 kg/m    Wt Readings from Last 3 Encounters:  06/18/23 102.5 kg  03/27/23 103.4 kg  10/24/22 100.7 kg    Gen: no distress  Neck: No JVD Cardiac: No Rubs or Gallops, systolic and diastolic murmur, RRR +2 Respiratory: Clear to auscultation bilaterally, normal effort, normal  respiratory rate GI: Soft, nontender, non-distended  MS: No  edema;  moves all extremities Integument: Skin feels waarm Neuro:  At time of evaluation, alert and oriented to person/place/time/situation  Psych: Normal affect, patient feels ok   ASSESSMENT AND PLAN: .     Hypertrophic nonobstructive cardiomyopathy - Hypertrophic nonobstructive cardiomyopathy with maximal septal thickness of 31 mm and significant scar burden on MRI, increasing arrhythmia risk. ICD in place for arrhythmia prevention, with no shocks reported. Surgical options, including  Bentall procedure and possible  myectomy, discussed based on surgical evaluation and surgeon's comfort level. - septal thickness 31 mm S/p ICD  Severe aortic aneurysm Severe dilation of the aortic root on MRI, indicating surgical intervention. Options include Bentall procedure with potential myectomy, contingent on surgical evaluation and surgeon's expertise. - Schedule left and right heart catheterization to assess for blockages and evaluate valve function. - Schedule transesophageal echocardiogram (TEE) to further evaluate the aortic root and valve. - Refer to Dr. Sherene Dilling for surgical evaluation and potential intervention.  Moderate aortic regurgitation Moderate aortic regurgitation on MRI, not requiring immediate surgical intervention but may be addressed during aortic aneurysm surgery.  He notes he is presently tobacco free.  Post operative follow up recommended. Will Defer diasbility evaluation to primary  Gloriann Larger, MD FASE [facility] Cardiologist [facility]  [address] [place], [address] [place], [place] [address] [phone]  10:50 AM

## 2023-06-18 NOTE — Patient Instructions (Signed)
 Medication Instructions:  Your physician recommends that you continue on your current medications as directed. Please refer to the Current Medication list given to you today.  *If you need a refill on your cardiac medications before your next appointment, please call your pharmacy*  Lab Work: BMP, CBC  If you have labs (blood work) drawn today and your tests are completely normal, you will receive your results only by: MyChart Message (if you have MyChart) OR A paper copy in the mail If you have any lab test that is abnormal or we need to change your treatment, we will call you to review the results.  Testing/Procedures: Your physician has requested that you have a TEE. During a TEE, sound waves are used to create images of your heart. It provides your doctor with information about the size and shape of your heart and how well your heart's chambers and valves are working. In this test, a transducer is attached to the end of a flexible tube that's guided down your throat and into your esophagus (the tube leading from you mouth to your stomach) to get a more detailed image of your heart. You are not awake for the procedure. Please see the instruction sheet given to you today. For further information please visit [email or web address]    Your physician has requested that you have a cardiac catheterization. Cardiac catheterization is used to diagnose and/or treat various heart conditions. Doctors may recommend this procedure for a number of different reasons. The most common reason is to evaluate chest pain. Chest pain can be a symptom of coronary artery disease (CAD), and cardiac catheterization can show whether plaque is narrowing or blocking your heart's arteries. This procedure is also used to evaluate the valves, as well as measure the blood flow and oxygen levels in different parts of your heart. For further information please visit [email or web address] Please follow instruction sheet, as given.    Follow-Up:to be determined At [facility], you and your health needs are our priority.  As part of our continuing mission to provide you with exceptional heart care, our providers are all part of one team.  This team includes your primary Cardiologist (physician) and Advanced Practice Providers or APPs (Physician Assistants and Nurse Practitioners) who all work together to provide you with the care you need, when you need it.  Provider:   Jann Melody, MD      Other Instructions   You are scheduled for a TEE (Transesophageal Echocardiogram) on Thursday, May 1 with Dr. Paulita Boss.  Please arrive at the [facility] (Main Entrance A) at [facility]: [address] [place], [place] [address] at 9:00 AM (This time is 1 hour(s) before your procedure to ensure your preparation).   Free valet parking service is available. You will check in at ADMITTING.   *Please Note: You will receive a call the day before your procedure to confirm the appointment time. That time may have changed from the original time based on the schedule for that day.*   DIET:  Nothing to eat or drink after midnight except a sip of water with medications (see medication instructions below)  MEDICATION INSTRUCTIONS: !!IF ANY NEW MEDICATIONS ARE STARTED AFTER TODAY, PLEASE NOTIFY YOUR PROVIDER AS SOON AS POSSIBLE!!  FYI: Medications such as Semaglutide (Ozempic, [place]), Tirzepatide (Mounjaro, Zepbound), Dulaglutide (Trulicity), etc ("GLP1 agonists") AND Canagliflozin (Invokana), Dapagliflozin  (Farxiga ), Empagliflozin  (Jardiance ), Ertugliflozin (Steglatro), Bexagliflozin [employer]) or any combination with one of these drugs such as Invokamet (Canagliflozin/Metformin), Synjardy (Empagliflozin /Metformin), etc ("  SGLT2 inhibitors") must be held around the time of a procedure. This is not a comprehensive list of all of these drugs. Please review all of your medications and talk to your provider if you  take any one of these. If you are not sure, ask your provider.     HOLD: Empagliflozin  (Jardiance ) for 3 day prior to the procedure. Last dose on Sunday, April 27.    DO NOT TAKE: Spironolactone  and Entresto  the morning of procedures.    LABS:  Today at [employer]:  For your safety, and to allow us  to monitor your vital signs accurately during the surgery/procedure we request: If you have artificial nails, gel coating, SNS etc, please have those removed prior to your surgery/procedure. Not having the nail coverings /polish removed may result in cancellation or delay of your surgery/procedure.  Your support person will be asked to wait in the waiting room during your procedure.  It is OK to have someone drop you off and come back when you are ready to be discharged.  You cannot drive after the procedure and will need someone to drive you home.  Bring your insurance cards.  *Special Note: Every effort is made to have your procedure done on time. Occasionally there are emergencies that occur at the hospital that may cause delays. Please be patient if a delay does occur.     HEART CATH INSTRUCTIONS  You are scheduled for a Cardiac Catheterization on Thursday, May 1 with Dr. Arnoldo Lapping.  1. Please arrive at the [facility] (Main Entrance A) at [facility]: [address] [place], [place] [address] at 9:00 AM.  Your Heart Cath is scheduled for 12 Noon.  Free valet parking service is available. You will check in at ADMITTING. The support person will be asked to wait in the waiting room.  It is OK to have someone drop you off and come back when you are ready to be discharged.    Special note: Every effort is made to have your procedure done on time. Please understand that emergencies sometimes delay scheduled procedures.  2. Diet: Do not eat solid foods after midnight.  The patient may have clear liquids until 5am upon the day of the procedure.  3. Labs: You will need to  have blood drawn on TODAY  4. Medication instructions in preparation for your procedure:   Contrast Allergy: No   DO NOT TAKE Jardiance  and Entresto  on the morning of Heart Cath.   On the morning of your procedure, take your Aspirin  81 mg and any morning medicines NOT listed above.  You may use sips of water.  5. Plan to go home the same day, you will only stay overnight if medically necessary. 6. Bring a current list of your medications and current insurance cards. 7. You MUST have a responsible person to drive you home. 8. Someone MUST be with you the first 24 hours after you arrive home or your discharge will be delayed. 9. Please wear clothes that are easy to get on and off and wear slip-on shoes.  Thank you for allowing us  to care for you!   -- [facility]     1st Floor: - Lobby - Registration  - Pharmacy  - Lab - Cafe  2nd Floor: - PV Lab - Diagnostic Testing (echo, CT, nuclear med)  3rd Floor: - Vacant  4th Floor: - TCTS (cardiothoracic surgery) - AFib Clinic - Structural Heart Clinic -  Vascular Surgery  - Vascular Ultrasound  5th Floor: - [facility] (general and EP) - Clinical Pharmacy for coumadin, hypertension, lipid, weight-loss medications, and med management appointments    Valet parking services will be available as well.

## 2023-06-18 NOTE — Progress Notes (Signed)
 Cardiology Office Note:  .    Date:  06/18/2023  ID:  Thomas Spears, DOB 12/13/1975, MRN [id number] PCP: Patient, No Pcp Per  [facility] Providers Cardiologist:  Jann Melody, MD     CC: Results follow up  History of Present Illness: .    Thomas Spears is a 48 y.o. 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  He has a history of hypertrophic nonobstructive cardiomyopathy with a maximal septal thickness of 31 mm and is status post-implantation of an implantable cardioverter-defibrillator (ICD) due to the risk of arrhythmias associated with myocardial scarring. The ICD has never delivered a shock.  He has heart failure with reduced ejection fraction. A recent MRI revealed significant scar burden and a severely dilated aortic root with moderate aortic regurgitation. He is asymptomatic, with no chest pain or shortness of breath.  He underwent a heart catheterization in 2023, which did not reveal any blockages.  He works in a [employer] and is the sole provider for his household. He does not smoke and is concerned about the potential need for surgery and its impact on his ability to work.  ROS: As per HPI.   Studies Reviewed: .   Cardiac Studies & Procedures   ______________________________________________________________________________________________ CARDIAC CATHETERIZATION  CARDIAC CATHETERIZATION 09/20/2021  Conclusion 1.  Patent left main, LAD, and left circumflex, with mild nonobstructive plaquing 2.  Severe distal RCA stenosis with positive RFR of 0.78, treated successfully with PCI using a 3.5 x 24 mm Synergy DES deployed over the distal RCA and extending into the posterolateral branch  Recommendations: Continued risk reduction measures and GDMT for heart failure, aspirin  and clopidogrel  x 6 months without interruption, okay for discharge this evening  as long as criteria met.  Findings Coronary Findings Diagnostic  Dominance: Right  Left Main Vessel is large. Mid LM to Dist LM lesion is 20% stenosed. The lesion is focal and concentric.  Left Anterior Descending There is mild diffuse disease throughout the vessel. Ost LAD to Prox LAD lesion is 30% stenosed. The lesion is focal and concentric. Mid LAD lesion is 45% stenosed. The lesion is located at the bifurcation and eccentric.  Second Diagonal Branch Vessel is small in size.  Third Diagonal Branch Vessel is small in size.  Left Circumflex Vessel is large. The vessel exhibits minimal luminal irregularities.  First Obtuse Marginal Branch Vessel is small in size. Vessel is angiographically normal.  Second Obtuse Marginal Branch Vessel is small in size.  First Left Posterolateral Branch Vessel is small in size.  Left Posterior Atrioventricular Artery Vessel is small in size.  Right Coronary Artery Vessel is large. There is moderate focal disease in the vessel. The vessel exhibits minimal luminal irregularities. The vessel is moderately tortuous. Prox RCA lesion is 30% stenosed. The lesion is eccentric. Dist RCA lesion is 75% stenosed. The lesion is discrete and concentric.  Acute Marginal Branch Vessel is small in size.  Right Ventricular Branch Vessel is small in size.  First Right Posterolateral Branch Vessel is moderate in size.  Second Right Posterolateral Branch Vessel is large in size.  Intervention  Dist RCA lesion Stent CATH LAUNCHER 6FR JR4 guide catheter was inserted. Lesion crossed with guidewire using a WIRE COUGAR XT STRL 190CM. Pre-stent angioplasty was performed using a BALLN SAPPHIRE 3.0X15. A drug-eluting stent was successfully placed using a SYNERGY XD 3.50X24. Post-stent angioplasty was performed using a BALL SAPPHIRE NC24 3.75X15. Maximum pressure:  16 atm. Post-Intervention Lesion Assessment The intervention was successful.  Pre-interventional TIMI flow is 3. Post-intervention TIMI flow is 3. No complications occurred at this lesion. There is a 0% residual stenosis post intervention.   CARDIAC CATHETERIZATION  CARDIAC CATHETERIZATION 08/31/2020  Conclusion  LV end diastolic pressure is moderately elevated.  There is no aortic valve stenosis.  Mid LM to Dist LM lesion is 20% stenosed.  Ost LAD to Prox LAD lesion is 30% stenosed.  Mid LAD lesion is 45% stenosed @ 1st Diag (40% pre & 50% post 1st Diag)  Prox RCA lesion is 30% stenosed.  Dist RCA lesion is 60% stenosed - discrete focal lesion, not flow limiting.  No evidence of stent.  SUMMARY  Diffuse mild to moderate multivessel disease but no obvious culprit lesion to explain significant reduced EF.  Unable to visualize any previously placed stents.  Moderately elevated LVEDP   RECOMMENDATIONS  Continue to titrate GDMT for nonischemic CM. ->  Anticipate titrating up carvedilol  to 12.5 mg twice daily and further titration of Entresto .  May also consider spironolactone    Randene Bustard, MD  Findings Coronary Findings Diagnostic  Dominance: Right  Left Main Vessel was injected. Vessel is large. Mid LM to Dist LM lesion is 20% stenosed. The lesion is focal and concentric.  Left Anterior Descending Ost LAD to Prox LAD lesion is 30% stenosed. The lesion is focal and concentric. Mid LAD lesion is 45% stenosed. The lesion is located at the bifurcation and eccentric.  Second Diagonal Branch Vessel is small in size.  Third Diagonal Branch Vessel is small in size.  Left Circumflex Vessel is large. Vessel is angiographically normal.  First Obtuse Marginal Branch Vessel is small in size. Vessel is angiographically normal.  Second Obtuse Marginal Branch Vessel is small in size.  First Left Posterolateral Branch Vessel is small in size.  Left Posterior Atrioventricular Artery Vessel is small in size.  Right Coronary  Artery Vessel was injected. Vessel is large. There is moderate focal disease in the vessel. The vessel exhibits minimal luminal irregularities. The vessel is moderately tortuous. Prox RCA lesion is 30% stenosed. The lesion is eccentric. Dist RCA lesion is 60% stenosed. The lesion is discrete and concentric.  Acute Marginal Branch Vessel is small in size.  Right Ventricular Branch Vessel is small in size.  First Right Posterolateral Branch Vessel is moderate in size.  Second Right Posterolateral Branch Vessel is large in size.  Intervention  No interventions have been documented.     ECHOCARDIOGRAM  ECHOCARDIOGRAM COMPLETE 04/12/2023  Narrative ECHOCARDIOGRAM REPORT    Patient Name:   Thomas Spears Date of Exam: 04/12/2023 Medical Rec #:  [id number]        Height:       69.0 in Accession #:    [id number]       Weight:       228.0 lb Date of Birth:  1975-08-29        BSA:          2.184 m Patient Age:    47 years         BP:           138/93 mmHg Patient Gender: M                HR:           71 bpm. Exam Location:  [address]  Procedure: 2D Echo and Strain Analysis (Both Spectral and Color Flow Doppler were utilized during procedure).  Indications:    I42.2 HCM; I35.1 Nonrheumatic aortic (valve) insufficiency; I34.0 Nonrheumatic mitral (valve) insufficiency; I71.2 Ascending aortic aneurysm  History:        Patient has prior history of Echocardiogram examinations, most recent 03/20/2021. CHF, CAD, Defibrillator; Risk Factors:Hypertension, Dyslipidemia and Former Smoker. NSVT. NICM.  Sonographer:    Mylinda Asa RCS Referring Phys: [facility] Miki Alert   Sonographer Comments: Consulted with Dr. Stann Earnest (DOD), in reference to ascending aorta. IMPRESSIONS   1. There is severe concentric LVH with septal prominence (septum 3.5cm and PW 2.6cm). There is no significant LVOT gradient at rest or with Valsalva. Left ventricular ejection fraction, by estimation, is  70 to 75%. The left ventricle has hyperdynamic function. The left ventricle has no regional wall motion abnormalities. There is severe concentric left ventricular hypertrophy. Left ventricular diastolic parameters are consistent with Grade I diastolic dysfunction (impaired relaxation). The average left ventricular global longitudinal strain is -24.1 %. The global longitudinal strain is normal. 2. Right ventricular systolic function is normal. The right ventricular size is normal. 3. Left atrial size was mildly dilated. 4. The mitral valve is normal in structure. Trivial mitral valve regurgitation. No evidence of mitral stenosis. 5. The aortic valve is normal in structure. Aortic valve regurgitation is mild. No aortic stenosis is present. 6. Aortic dilatation noted. There is severe dilatation of the aortic root, measuring 55 mm. There is moderate dilatation of the ascending aorta, measuring 45 mm. 7. The inferior vena cava is normal in size with greater than 50% respiratory variability, suggesting right atrial pressure of 3 mmHg.  FINDINGS Left Ventricle: There is severe concentric LVH with septal prominence (septum 3.5cm and PW 2.6cm). There is no significant LVOT gradient at rest or with Valsalva. Left ventricular ejection fraction, by estimation, is 70 to 75%. The left ventricle has hyperdynamic function. The left ventricle has no regional wall motion abnormalities. The average left ventricular global longitudinal strain is -24.1 %. Strain was performed and the global longitudinal strain is normal. The left ventricular internal cavity size was normal in size. There is severe concentric left ventricular hypertrophy. Left ventricular diastolic parameters are consistent with Grade I diastolic dysfunction (impaired relaxation).  Right Ventricle: The right ventricular size is normal. No increase in right ventricular wall thickness. Right ventricular systolic function is normal.  Left Atrium: Left  atrial size was mildly dilated.  Right Atrium: Right atrial size was normal in size.  Pericardium: There is no evidence of pericardial effusion.  Mitral Valve: The mitral valve is normal in structure. Trivial mitral valve regurgitation. No evidence of mitral valve stenosis.  Tricuspid Valve: The tricuspid valve is normal in structure. Tricuspid valve regurgitation is trivial. No evidence of tricuspid stenosis.  Aortic Valve: The aortic valve is normal in structure. Aortic valve regurgitation is mild. No aortic stenosis is present.  Pulmonic Valve: The pulmonic valve was normal in structure. Pulmonic valve regurgitation is trivial. No evidence of pulmonic stenosis.  Aorta: Aortic dilatation noted. There is severe dilatation of the aortic root, measuring 55 mm. There is moderate dilatation of the ascending aorta, measuring 45 mm.  Venous: The inferior vena cava is normal in size with greater than 50% respiratory variability, suggesting right atrial pressure of 3 mmHg.  IAS/Shunts: No atrial level shunt detected by color flow Doppler.  Additional Comments: 3D imaging was not performed.   LEFT VENTRICLE PLAX 2D LVIDd:         3.80 cm   Diastology LVIDs:  2.60 cm   LV e' medial:    5.10 cm/s LV PW:         2.60 cm   LV E/e' medial:  11.7 LV IVS:        3.50 cm   LV e' lateral:   3.81 cm/s LVOT diam:     2.30 cm   LV E/e' lateral: 15.7 LV SV:         74 LV SV Index:   34        2D Longitudinal Strain LVOT Area:     4.15 cm  2D Strain GLS Avg:     -24.1 %   RIGHT VENTRICLE RV Basal diam:  4.00 cm RV S prime:     15.90 cm/s TAPSE (M-mode): 2.4 cm  LEFT ATRIUM             Index        RIGHT ATRIUM           Index LA diam:        4.50 cm 2.06 cm/m   RA Area:     17.50 cm LA Vol (A2C):   50.6 ml 23.17 ml/m  RA Volume:   46.10 ml  21.11 ml/m LA Vol (A4C):   49.9 ml 22.84 ml/m LA Biplane Vol: 49.9 ml 22.84 ml/m AORTIC VALVE LVOT Vmax:   112.00 cm/s LVOT Vmean:  64.600  cm/s LVOT VTI:    0.179 m  AORTA Ao Root diam: 4.40 cm Ao Asc diam:  4.50 cm  MITRAL VALVE MV Area (PHT): 3.91 cm    SHUNTS MV Decel Time: 194 msec    Systemic VTI:  0.18 m MV E velocity: 59.70 cm/s  Systemic Diam: 2.30 cm MV A velocity: 87.50 cm/s MV E/A ratio:  0.68  Jules Oar MD Electronically signed by Jules Oar MD Signature Date/Time: 04/12/2023/11:58:35 AM    Final    MONITORS  LONG TERM MONITOR (3-14 DAYS) 09/20/2020  Narrative  Patch wear time was 8 days and 12 hours.  Predominant rhythm was NSR with average HR 79bpm (ranging from 34-169bpm).  There were 3 runs of nonsustained VT with longest/fastest lasting 8 beats at a rate of 169bpm  Rare SVE (<1%), rare VE (<1%)  Mobitz type I block seen during sleep  Patient triggered events correlated with NSR  No Afib, sustained arrhythmias or significant pauses   Patch Wear Time:  8 days and 12 hours (2022-07-11T21:01:27-0400 to 2022-07-20T09:54:00-0400)  Patient had a min HR of 34 bpm, max HR of 169 bpm, and avg HR of 79 bpm. Predominant underlying rhythm was Sinus Rhythm. First Degree AV Block was present. 3 Ventricular Tachycardia runs occurred, the run with the fastest interval lasting 8 beats with a max rate of 169 bpm (avg 130 bpm); the run with the fastest interval was also the longest. Second Degree AV Block-Mobitz I (Wenckebach) was present. Isolated SVEs were rare (<1.0%), and no SVE Couplets or SVE Triplets were present. Isolated VEs were rare (<1.0%, 170), VE Couplets were rare (<1.0%, 12), and VE Triplets were rare (<1.0%, 6). Ventricular Trigeminy was present.  Riccardo Chamberlain, MD     CARDIAC MRI  MR CARDIAC MORPHOLOGY W WO CONTRAST 06/12/2023  Narrative CLINICAL DATA:  Clinical question of hypertrophic cardiomyopathy Study assumes BSA of 2.24 m2.  EXAM: CARDIAC MRI  TECHNIQUE: The patient was scanned on a 1.5 Tesla GE magnet. A dedicated cardiac coil was used. Functional  imaging was done using Fiesta sequences. 2,3, and 4  chamber views were done to assess for RWMA's. Modified Simpson's rule using was used to calculate an ejection fraction on a dedicated work [employer]. The patient received 10 cc of Gadavist . After 10 minutes inversion recovery sequences were used to assess for infiltration and scar tissue. Flow quantification was performed 2 times during this examination with flow quantification performed at the levels of the ascending aorta above the valve, pulmonary artery above the valve.  CONTRAST:  10 cc  of Gadavist   FINDINGS: 1. Mild dilation of left ventricular size, with LVEDD 58 mm, but LVEDVi 114 mL/m2.  Study suggestive of hypertrophic cardiomyopathy. No LVOT obstruction. Sigmoid septum. Severe hypertrophy with maximal septal thickness 31 mm.  Low normal left ventricular systolic function (LVEF =51%). There are no regional wall motion abnormalities.  There is late gadolinium enhancement in the left ventricular myocardium. Patchy LGE that predominates on the anterolateral LV myocardium. Device lead artifact.  2. Normal right ventricular size with RVEDVI 74 mL/m2.  Normal right ventricular thickness.  Normal right ventricular systolic function (RVEF =51%). There are no regional wall motion abnormalities or aneurysms.  There is a device lead seen in the left atrium and left ventricle. Course is not well evaluated in this study.  3.  Normal left atrial size.  Mild right atrial dilation.  4. Moderate ascending aortic aneurysm 49 mm. Severe root dilation, 55 mm. Root is not well visualized. Normal main pulmonary artery caliber.  5. Valve assessment:  Aortic Valve: Valve morphology not well assessed. There is moderate regurgitation. Regurgitant fraction 26%.  Pulmonic Valve: There is moderate regurgitation. Regurgitant fraction 22%.  Tricuspid Valve: No significant regurgitation.  Mitral Valve: No  significant regurgitation.  6.  Normal pericardium.  No pericardial effusion.  7. Grossly, no extracardiac findings. Recommended dedicated study if concerned for non-cardiac pathology.  8. Device lead artifacts noted. Unable to quantify LGE. Unable to perform parametric mapping.  IMPRESSION: 1. Study suggestive of hypertrophic cardiomyopathy with maximal thickness 31 mm. Parametric mapping not well visualized due to device lead artifact. LGE qualitatively noted. CIED lead noted.  2. Low normal left ventricular systolic function (LVEF =51%) with mild dilation of left ventricular size my volume.  3. Severe aortic root aneurysm (55 mm), with moderate ascending aortic dilation (49 mm) and moderate aortic regurgitation.  Gloriann Larger MD   Electronically Signed By: Gloriann Larger M.D. On: 06/12/2023 17:30   ______________________________________________________________________________________________       Physical Exam:    VS:  BP (!) 130/90 (BP Location: Left Arm)   Pulse 70   Ht 5\' 8"  (1.727 m)   Wt 102.5 kg   SpO2 93%   BMI 34.36 kg/m    Wt Readings from Last 3 Encounters:  06/18/23 102.5 kg  03/27/23 103.4 kg  10/24/22 100.7 kg    Gen: no distress  Neck: No JVD Cardiac: No Rubs or Gallops, systolic and diastolic murmur, RRR +2 Respiratory: Clear to auscultation bilaterally, normal effort, normal  respiratory rate GI: Soft, nontender, non-distended  MS: No  edema;  moves all extremities Integument: Skin feels waarm Neuro:  At time of evaluation, alert and oriented to person/place/time/situation  Psych: Normal affect, patient feels ok   ASSESSMENT AND PLAN: .     Hypertrophic nonobstructive cardiomyopathy - Hypertrophic nonobstructive cardiomyopathy with maximal septal thickness of 31 mm and significant scar burden on MRI, increasing arrhythmia risk. ICD in place for arrhythmia prevention, with no shocks reported. Surgical options, including  Bentall procedure and possible  myectomy, discussed based on surgical evaluation and surgeon's comfort level. - septal thickness 31 mm S/p ICD  Severe aortic aneurysm Severe dilation of the aortic root on MRI, indicating surgical intervention. Options include Bentall procedure with potential myectomy, contingent on surgical evaluation and surgeon's expertise. - Schedule left and right heart catheterization to assess for blockages and evaluate valve function. - Schedule transesophageal echocardiogram (TEE) to further evaluate the aortic root and valve. - Refer to Dr. Sherene Dilling for surgical evaluation and potential intervention.  Moderate aortic regurgitation Moderate aortic regurgitation on MRI, not requiring immediate surgical intervention but may be addressed during aortic aneurysm surgery.  He notes he is presently tobacco free.  Post operative follow up recommended. Will Defer diasbility evaluation to primary  Gloriann Larger, MD FASE [facility] Cardiologist [facility]  [address] [place], [address] [place], [place] [address] [phone]  10:50 AM

## 2023-06-19 ENCOUNTER — Other Ambulatory Visit (HOSPITAL_COMMUNITY): Payer: Self-pay

## 2023-06-19 NOTE — Addendum Note (Signed)
 Addended by: Christine Cozier on: 06/19/2023 01:45 PM   Modules accepted: Orders

## 2023-06-20 ENCOUNTER — Other Ambulatory Visit (HOSPITAL_COMMUNITY): Payer: Self-pay

## 2023-06-20 ENCOUNTER — Telehealth: Payer: Self-pay

## 2023-06-20 ENCOUNTER — Telehealth: Payer: Self-pay | Admitting: Internal Medicine

## 2023-06-20 NOTE — Telephone Encounter (Signed)
 Pt c/o medication issue:  1. Name of Medication:   empagliflozin  (JARDIANCE ) 10 MG TABS tablet    2. How are you currently taking this medication (dosage and times per day)? Not currently taking it   3. Are you having a reaction (difficulty breathing--STAT)? No   4. What is your medication issue? Per pt nurse was suppose to be checking on pt assistance. Please advise

## 2023-06-20 NOTE — Telephone Encounter (Addendum)
 Nursing staff provided patient with PAP application. Follow up scheduled to check with patient on the status of their portion. Will fax provider pages to office once pt returns their part.

## 2023-06-20 NOTE — Telephone Encounter (Signed)
 Spoke with patient, emailed over patient assistance forms to fill out. Will forward to pharmacy assistance team for any further resources   [email or web address] - - no patient information sent, only patient assistance form

## 2023-06-20 NOTE — Telephone Encounter (Signed)
 Scheduled a follow up to check back in with patient on the status of their portion. Will complete provider portion once patient completes and returns their part.

## 2023-06-24 ENCOUNTER — Telehealth: Payer: Self-pay | Admitting: *Deleted

## 2023-06-24 NOTE — Telephone Encounter (Signed)
 Spoke with patient's finance (DPR), Cheryl-she will ask patient to call me.

## 2023-06-24 NOTE — Telephone Encounter (Addendum)
 Call placed to patient to ask if he had pre-procedure BMP/CBC-left message for patient to call back.  Cardiac Catheterization scheduled at [facility] for: Thursday Jun 27, 2023 12 Noon/TEE 10 AM Arrival time [facility] Main Entrance A at: 9 AM  Nothing to eat or drink after midnight prior to procedures.  Medication instructions: -Hold:  Jardiance /Spironolactone -AM of procedure -Other usual morning medications can be taken with sips of water including aspirin  81 mg.  Plan to go home the same day, you will only stay overnight if medically necessary.  You must have responsible adult to drive you home.  Someone must be with you the first 24 hours after you arrive home.  Left message for patient to call back to review procedure instructions.

## 2023-06-24 NOTE — Telephone Encounter (Signed)
Call placed to patient, no answer, voicemail message. 

## 2023-06-25 NOTE — Telephone Encounter (Signed)
Left message for patient to call back to review procedure instructions/lab.

## 2023-06-25 NOTE — Telephone Encounter (Signed)
 Call to patient, left voicemail message asking patient to call.

## 2023-06-26 NOTE — Telephone Encounter (Signed)
 I received Secure Chat message 06/25/23 that pt was on the phone needing to r/s his cath for 5/1 due to a death in the family and he is going out of town   I spoke with patient and he requests to reschedule 5/1 TEE/Cath due to death in family. After discussion with patient and Dr Paulita Boss will reschedule to 07/12/23 arrive 9 AM TEE 10 AM with Dr Paulita Boss, [facility] 12 Noon with Dr Ladora Piedmont tells me he will plan to get pre-procedure BMP/CBC 07/05/23.  Patient aware that I will send patient MyChart message with instructions.

## 2023-06-27 DIAGNOSIS — I422 Other hypertrophic cardiomyopathy: Secondary | ICD-10-CM

## 2023-06-27 DIAGNOSIS — I7121 Aneurysm of the ascending aorta, without rupture: Secondary | ICD-10-CM

## 2023-06-27 NOTE — Progress Notes (Deleted)
 Patient ID: Thomas Spears                 DOB: Jul 22, 1975                      MRN: [id number]      HPI: Thomas Spears is a 48 y.o. male referred by Dr. Paulita Boss to HTN clinic. PMH is significant for heart failure with reduced ejection fraction and hypertrophic cardiomyopathy diagnosed in 2023,Coronary artery disease with stable angina. Last percutaneous intervention in 2023- on ASA ( Plavix  d/c Jan 2025) hypertension, mitral and Aortic regurgitation, non- sustained VT - ICD managed by Dr.Klein, HLD, former tobacco user.   At  last visit with Dr. 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 Imdur  dose was increased from 15 mg daily to 30 mg daily.   Patient presented today for hypertension follow up. Reports he tolerates Imdur  increased dose well. He does not check BP at home from last few week because his home BP monitor broke. He add salt to his food when he cooks but try to avoid salty snacks. He has not started taking Jardiance  yet, did not get time to pick up from pharmacy. He walks a lot at work but does not have any other exercise regimen that he follows.   Current HTN meds: Carvedilol  37.5 mg twice daily, hydralazin 25 mg three times daily, spironolactone  25 mg daily, Entresto  97-103 mg twice daily, Imdur  30 mg daily   Previously tried/ intolerance: none  BP goal: <130/80   Family History:  Relation Problem Comments  Mother (Deceased)   Father (Deceased) CAD      Social History:  Smoking: none  Alcohol: none  Recreational drug use: none   Diet: try to avoid salty snacks. Still cook with salt  Snacks- mainly fruits, chips - once week    Exercise:  Walks a lot at work but he does not have any exercise regimen  Suggest to do moderate  intensity exercise ( brisk walking)  at least for 150 min per week   Home BP readings: does not check at home- meter broke    Wt Readings from Last 3 Encounters:  06/18/23 226 lb (102.5 kg)  03/27/23 228 lb (103.4 kg)  10/24/22 222 lb (100.7 kg)   BP Readings from Last 3 Encounters:  06/18/23 (!) 130/90  05/13/23 (!) 145/110  03/27/23 (!) 130/90   Pulse Readings from Last 3 Encounters:  06/18/23 70  05/13/23 78  03/27/23 74    Renal function: CrCl cannot be calculated (Patient's most recent lab result is older than the maximum 21 days allowed.).  Past Medical History:  Diagnosis Date   Aortic regurgitation    Ascending aorta dilation (HCC)    CAD (coronary artery disease), native coronary artery 09/20/2021   Chronic combined systolic and diastolic CHF (congestive heart failure) (HCC)    Community acquired pneumonia    HLD (hyperlipidemia)    Hypertension    Hypertensive crisis 05/22/2020   Hypertrophic cardiomyopathy (HCC)    Left ventricular hypertrophy    Mitral regurgitation    Mobitz type 1 second degree AV block    NSVT (nonsustained ventricular tachycardia) (HCC)    Premature atrial contractions    PVC's (premature ventricular contractions)    Tension headache  06/21/2020   Tobacco use    Tobacco use 09/20/2021    Current Outpatient Medications on File Prior to Visit  Medication Sig Dispense Refill   acetaminophen  (TYLENOL ) 500 MG tablet Take 2 tablets (1,000 mg total) by mouth every 8 (eight) hours as needed for up to 30 doses for mild pain or fever. 60 tablet 0   amLODipine  (NORVASC ) 5 MG tablet Take 1 tablet (5 mg total) by mouth daily. 180 tablet 3   aspirin  EC 81 MG tablet Take 1 tablet (81 mg total) by mouth every evening. Swallow whole. 30 tablet 3   atorvastatin  (LIPITOR) 80 MG tablet Take 1 tablet (80 mg total) by mouth daily. 90 tablet 3   carvedilol  (COREG ) 25 MG tablet Take 1.5 tablets (37.5 mg total) by mouth 2 (two) times daily. 270 tablet 3    empagliflozin  (JARDIANCE ) 10 MG TABS tablet Take 1 tablet (10 mg total) by mouth daily before breakfast. 30 tablet 11   hydrALAZINE  (APRESOLINE ) 25 MG tablet Take 1 tablet (25 mg total) by mouth 3 (three) times daily. 270 tablet 1   isosorbide  mononitrate (IMDUR ) 30 MG 24 hr tablet Take 1 tablet (30 mg total) by mouth daily. 90 tablet 3   pantoprazole  (PROTONIX ) 40 MG tablet Take 1 tablet (40 mg total) by mouth daily. 30 tablet 7   sacubitril -valsartan  (ENTRESTO ) 97-103 MG Take 1 tablet by mouth 2 (two) times daily. 180 tablet 3   spironolactone  (ALDACTONE ) 25 MG tablet Take 1 tablet (25 mg total) by mouth daily. 90 tablet 2   No current facility-administered medications on file prior to visit.    Allergies  Allergen Reactions   Mushroom Extract Complex (Obsolete) Anaphylaxis   Levaquin [Levofloxacin]     History of aortic aneurysm     There were no vitals taken for this visit.   Assessment/Plan:  1. Hypertension -  No problem-specific Assessment & Plan notes found for this encounter.   Thank you  Nickola Baron, Pharm.D [facility] of  [facility] [address], [place], [place] [address]  Phone: [phone]; Fax: [phone]

## 2023-06-28 ENCOUNTER — Ambulatory Visit: Payer: Self-pay | Attending: Pharmacist | Admitting: Pharmacist

## 2023-07-06 LAB — BASIC METABOLIC PANEL WITH GFR
BUN/Creatinine Ratio: 14 (ref 9–20)
BUN: 19 mg/dL (ref 6–24)
CO2: 22 mmol/L (ref 20–29)
Calcium: 9.6 mg/dL (ref 8.7–10.2)
Chloride: 99 mmol/L (ref 96–106)
Creatinine, Ser: 1.33 mg/dL — ABNORMAL HIGH (ref 0.76–1.27)
Glucose: 95 mg/dL (ref 70–99)
Potassium: 4.4 mmol/L (ref 3.5–5.2)
Sodium: 136 mmol/L (ref 134–144)
eGFR: 66 mL/min/{1.73_m2} (ref >59)

## 2023-07-06 LAB — CBC
Hematocrit: 45.4 % (ref 37.5–51.0)
Hemoglobin: 15.3 g/dL (ref 13.0–17.7)
MCH: 29.1 pg (ref 26.6–33.0)
MCHC: 33.7 g/dL (ref 31.5–35.7)
MCV: 86 fL (ref 79–97)
Platelets: 253 10*3/uL (ref 150–450)
RBC: 5.26 x10E6/uL (ref 4.14–5.80)
RDW: 13 % (ref 11.6–15.4)
WBC: 11.5 10*3/uL — ABNORMAL HIGH (ref 3.4–10.8)

## 2023-07-08 ENCOUNTER — Ambulatory Visit: Payer: 59 | Admitting: Internal Medicine

## 2023-07-09 ENCOUNTER — Encounter: Payer: Self-pay | Admitting: Internal Medicine

## 2023-07-11 ENCOUNTER — Telehealth: Payer: Self-pay | Admitting: *Deleted

## 2023-07-11 NOTE — Progress Notes (Signed)
 Pt called for pre procedure instructions.  Message left on identified number Arrival time 0800 NPO after midnight explained.  Reminded to take ASA 81 mg in am with sips for heart cath as well as TEE tomorrow. Instructed pt need for ride home tomorrow and have responsible adult with them for 24 hrs post procedure.

## 2023-07-11 NOTE — Telephone Encounter (Addendum)
 Left patient detailed voicemail message (DPR) with instructions.   Your TEE and Cardiac Cath have been rescheduled to Friday May 16.  You are scheduled for a TEE (Transesophageal Echocardiogram) with Dr. Paulita Boss and a Cardiac Catheterization with Dr Arnoldo Lapping.   Please let us  know if there are any changes in your medications before then.   -Please arrive at the [facility] (Main Entrance A) at [facility]: [address] [place], [place] [address] on May 16 at 9:00 AM. This time is 1 hour before TEE at 10 AM. Cardiac Cath is scheduled for 12 Noon .   Free valet parking service is available. You will check in at ADMITTING. The support person will be asked to wait in the waiting room.  It is OK to have someone drop you off and come back when you are ready to be discharged.     Special note: Every effort is made to have your procedure done on time. Please understand that emergencies sometimes delay scheduled procedures.   -Diet: Do not eat or drink after midnight.     -Medication instructions:  DO NOT take empagliflozin  (Jardiance ) for 3 days before the procedures.  Your last dose will be on Monday May 12 until after the procedures.  DO NOT take spironolactone  (Aldactone ) the morning of the procedures.    On the morning of your procedure, take aspirin  81 mg and any morning medicines NOT listed above.  You may use sips of water.   -. Plan to go home the same day, you will only stay overnight if medically necessary. -. Bring a current list of your medications and current insurance cards. -. You MUST have a responsible person to drive you home. -Someone MUST be with you the first 24 hours after you arrive home or your discharge will be delayed. - Please wear clothes that are easy to get on and off and wear slip-on shoes.

## 2023-07-12 ENCOUNTER — Other Ambulatory Visit: Payer: Self-pay

## 2023-07-12 ENCOUNTER — Ambulatory Visit (HOSPITAL_COMMUNITY)
Admission: RE | Admit: 2023-07-12 | Discharge: 2023-07-12 | Disposition: A | Discharge status: Home / Self Care | Payer: Self-pay | Source: Ambulatory Visit | Attending: Internal Medicine | Admitting: Internal Medicine

## 2023-07-12 ENCOUNTER — Encounter (HOSPITAL_COMMUNITY)
Admission: RE | Disposition: A | Discharge status: Home / Self Care | Payer: Self-pay | Source: Home / Self Care | Attending: Internal Medicine

## 2023-07-12 ENCOUNTER — Ambulatory Visit (HOSPITAL_COMMUNITY): Payer: Self-pay | Admitting: Anesthesiology

## 2023-07-12 ENCOUNTER — Ambulatory Visit (HOSPITAL_COMMUNITY)
Admission: RE | Admit: 2023-07-12 | Discharge: 2023-07-12 | Disposition: A | Discharge status: Home / Self Care | Payer: Self-pay | Attending: Internal Medicine | Admitting: Internal Medicine

## 2023-07-12 DIAGNOSIS — Z95810 Presence of automatic (implantable) cardiac defibrillator: Secondary | ICD-10-CM | POA: Insufficient documentation

## 2023-07-12 DIAGNOSIS — I7121 Aneurysm of the ascending aorta, without rupture: Secondary | ICD-10-CM | POA: Insufficient documentation

## 2023-07-12 DIAGNOSIS — I110 Hypertensive heart disease with heart failure: Secondary | ICD-10-CM | POA: Insufficient documentation

## 2023-07-12 DIAGNOSIS — I351 Nonrheumatic aortic (valve) insufficiency: Secondary | ICD-10-CM

## 2023-07-12 DIAGNOSIS — Z87891 Personal history of nicotine dependence: Secondary | ICD-10-CM | POA: Insufficient documentation

## 2023-07-12 DIAGNOSIS — I2510 Atherosclerotic heart disease of native coronary artery without angina pectoris: Secondary | ICD-10-CM | POA: Insufficient documentation

## 2023-07-12 DIAGNOSIS — I422 Other hypertrophic cardiomyopathy: Secondary | ICD-10-CM | POA: Insufficient documentation

## 2023-07-12 DIAGNOSIS — Z955 Presence of coronary angioplasty implant and graft: Secondary | ICD-10-CM | POA: Insufficient documentation

## 2023-07-12 DIAGNOSIS — I509 Heart failure, unspecified: Secondary | ICD-10-CM | POA: Insufficient documentation

## 2023-07-12 DIAGNOSIS — I5042 Chronic combined systolic (congestive) and diastolic (congestive) heart failure: Secondary | ICD-10-CM

## 2023-07-12 DIAGNOSIS — F17210 Nicotine dependence, cigarettes, uncomplicated: Secondary | ICD-10-CM

## 2023-07-12 HISTORY — PX: RIGHT/LEFT HEART CATH AND CORONARY ANGIOGRAPHY: CATH118266

## 2023-07-12 HISTORY — PX: TRANSESOPHAGEAL ECHOCARDIOGRAM (CATH LAB): EP1270

## 2023-07-12 LAB — POCT I-STAT 7, (LYTES, BLD GAS, ICA,H+H)
Acid-base deficit: 2 mmol/L (ref 0.0–2.0)
Bicarbonate: 23.4 mmol/L (ref 20.0–28.0)
Calcium, Ion: 1.24 mmol/L (ref 1.15–1.40)
HCT: 41 % (ref 39.0–52.0)
Hemoglobin: 13.9 g/dL (ref 13.0–17.0)
O2 Saturation: 96 %
Potassium: 3.8 mmol/L (ref 3.5–5.1)
Sodium: 139 mmol/L (ref 135–145)
TCO2: 25 mmol/L (ref 22–32)
pCO2 arterial: 42.9 mmHg (ref 32–48)
pH, Arterial: 7.346 — ABNORMAL LOW (ref 7.35–7.45)
pO2, Arterial: 89 mmHg (ref 83–108)

## 2023-07-12 LAB — ECHO TEE

## 2023-07-12 LAB — POCT I-STAT EG7
Acid-base deficit: 2 mmol/L (ref 0.0–2.0)
Bicarbonate: 24.1 mmol/L (ref 20.0–28.0)
Calcium, Ion: 1.24 mmol/L (ref 1.15–1.40)
HCT: 41 % (ref 39.0–52.0)
Hemoglobin: 13.9 g/dL (ref 13.0–17.0)
O2 Saturation: 73 %
Potassium: 3.8 mmol/L (ref 3.5–5.1)
Sodium: 140 mmol/L (ref 135–145)
TCO2: 25 mmol/L (ref 22–32)
pCO2, Ven: 44 mmHg (ref 44–60)
pH, Ven: 7.346 (ref 7.25–7.43)
pO2, Ven: 41 mmHg (ref 32–45)

## 2023-07-12 SURGERY — RIGHT/LEFT HEART CATH AND CORONARY ANGIOGRAPHY
Anesthesia: LOCAL

## 2023-07-12 SURGERY — TRANSESOPHAGEAL ECHOCARDIOGRAM (TEE) (CATHLAB)
Anesthesia: Monitor Anesthesia Care

## 2023-07-12 MED ORDER — IOHEXOL 350 MG/ML SOLN
INTRAVENOUS | Status: DC | PRN
Start: 2023-07-12 — End: 2023-07-12
  Administered 2023-07-12: 80 mL

## 2023-07-12 MED ORDER — LIDOCAINE HCL (PF) 1 % IJ SOLN
INTRAMUSCULAR | Status: AC
  Filled 2023-07-12: qty 30

## 2023-07-12 MED ORDER — VERAPAMIL HCL 2.5 MG/ML IV SOLN
INTRAVENOUS | Status: AC
  Filled 2023-07-12: qty 2

## 2023-07-12 MED ORDER — SODIUM CHLORIDE 0.9 % WEIGHT BASED INFUSION: 1.0000 mL/kg/h | INTRAVENOUS | Status: DC

## 2023-07-12 MED ORDER — SODIUM CHLORIDE 0.9% FLUSH: 3.0000 mL | Freq: Two times a day (BID) | INTRAVENOUS | Status: DC

## 2023-07-12 MED ORDER — LABETALOL HCL 5 MG/ML IV SOLN: 10.0000 mg | INTRAVENOUS | Status: DC | PRN

## 2023-07-12 MED ORDER — LIDOCAINE 2% (20 MG/ML) 5 ML SYRINGE
INTRAMUSCULAR | Status: DC | PRN
  Administered 2023-07-12: 40 mg via INTRAVENOUS

## 2023-07-12 MED ORDER — FENTANYL CITRATE (PF) 100 MCG/2ML IJ SOLN
INTRAMUSCULAR | Status: DC | PRN
  Administered 2023-07-12: 25 ug via INTRAVENOUS

## 2023-07-12 MED ORDER — MIDAZOLAM HCL 2 MG/2ML IJ SOLN
INTRAMUSCULAR | Status: AC
  Filled 2023-07-12: qty 2

## 2023-07-12 MED ORDER — LIDOCAINE HCL (PF) 1 % IJ SOLN
INTRAMUSCULAR | Status: DC | PRN
  Administered 2023-07-12 (×2): 2 mL via INTRADERMAL

## 2023-07-12 MED ORDER — HYDRALAZINE HCL 20 MG/ML IJ SOLN: 10.0000 mg | INTRAMUSCULAR | Status: DC | PRN

## 2023-07-12 MED ORDER — SODIUM CHLORIDE 0.9 % IV SOLN: 250.0000 mL | INTRAVENOUS | Status: DC | PRN

## 2023-07-12 MED ORDER — FENTANYL CITRATE (PF) 100 MCG/2ML IJ SOLN
INTRAMUSCULAR | Status: AC
  Filled 2023-07-12: qty 2

## 2023-07-12 MED ORDER — PROPOFOL 10 MG/ML IV BOLUS
INTRAVENOUS | Status: DC | PRN
  Administered 2023-07-12: 80 mg via INTRAVENOUS
  Administered 2023-07-12 (×3): 20 mg via INTRAVENOUS

## 2023-07-12 MED ORDER — ASPIRIN 81 MG PO CHEW: 81.0000 mg | CHEWABLE_TABLET | ORAL | Status: DC

## 2023-07-12 MED ORDER — SODIUM CHLORIDE 0.9 % IV SOLN: INTRAVENOUS | Status: DC

## 2023-07-12 MED ORDER — ONDANSETRON HCL 4 MG/2ML IJ SOLN: 4.0000 mg | Freq: Four times a day (QID) | INTRAMUSCULAR | Status: DC | PRN

## 2023-07-12 MED ORDER — HEPARIN SODIUM (PORCINE) 1000 UNIT/ML IJ SOLN
INTRAMUSCULAR | Status: DC | PRN
  Administered 2023-07-12: 5000 [IU] via INTRAVENOUS

## 2023-07-12 MED ORDER — ACETAMINOPHEN 325 MG PO TABS: 650.0000 mg | ORAL_TABLET | ORAL | Status: DC | PRN

## 2023-07-12 MED ORDER — SODIUM CHLORIDE 0.9 % WEIGHT BASED INFUSION
3.0000 mL/kg/h | INTRAVENOUS | Status: AC
  Administered 2023-07-12: 3 mL/kg/h via INTRAVENOUS

## 2023-07-12 MED ORDER — VERAPAMIL HCL 2.5 MG/ML IV SOLN
INTRAVENOUS | Status: DC | PRN
  Administered 2023-07-12: 10 mL via INTRA_ARTERIAL

## 2023-07-12 MED ORDER — HEPARIN (PORCINE) IN NACL 2000-0.9 UNIT/L-% IV SOLN
INTRAVENOUS | Status: DC | PRN
  Administered 2023-07-12: 1000 mL

## 2023-07-12 MED ORDER — SODIUM CHLORIDE 0.9 % IV SOLN
INTRAVENOUS | Status: DC | PRN
Start: 2023-07-12 — End: 2023-07-12

## 2023-07-12 MED ORDER — SODIUM CHLORIDE 0.9% FLUSH: 3.0000 mL | INTRAVENOUS | Status: DC | PRN

## 2023-07-12 MED ORDER — MIDAZOLAM HCL 2 MG/2ML IJ SOLN
INTRAMUSCULAR | Status: DC | PRN
  Administered 2023-07-12: 2 mg via INTRAVENOUS

## 2023-07-12 SURGICAL SUPPLY — 12 items
CATH BALLN WEDGE 5F 110CM (CATHETERS) IMPLANT
CATH INFINITI 5FR ANG PIGTAIL (CATHETERS) IMPLANT
CATH INFINITI 5FR JL5 (CATHETERS) IMPLANT
CATH INFINITI JR4 5F (CATHETERS) IMPLANT
DEVICE RAD COMP TR BAND LRG (VASCULAR PRODUCTS) IMPLANT
GLIDESHEATH SLEND SS 6F .021 (SHEATH) IMPLANT
GUIDEWIRE .025 260CM (WIRE) IMPLANT
GUIDEWIRE INQWIRE 1.5J.035X260 (WIRE) IMPLANT
KIT SINGLE USE MANIFOLD (KITS) IMPLANT
PACK CARDIAC CATHETERIZATION (CUSTOM PROCEDURE TRAY) ×1 IMPLANT
SET ATX-X65L (MISCELLANEOUS) IMPLANT
SHEATH GLIDE SLENDER 4/5FR (SHEATH) IMPLANT

## 2023-07-12 NOTE — Discharge Instructions (Signed)

## 2023-07-12 NOTE — Anesthesia Postprocedure Evaluation (Signed)
 Anesthesia Post Note  Patient: Thomas Spears  Procedure(s) Performed: TRANSESOPHAGEAL ECHOCARDIOGRAM     Patient location during evaluation: PACU Anesthesia Type: MAC Level of consciousness: awake and alert Pain management: pain level controlled Vital Signs Assessment: post-procedure vital signs reviewed and stable Respiratory status: spontaneous breathing, nonlabored ventilation and respiratory function stable Cardiovascular status: blood pressure returned to baseline and stable Postop Assessment: no apparent nausea or vomiting Anesthetic complications: no   No notable events documented.  Last Vitals:  Vitals:   07/12/23 0950 07/12/23 1000  BP: 113/76 120/76  Pulse: 60 (!) 57  Resp: 17 18  Temp: (!) 36.4 C   SpO2: 95% 96%    Last Pain:  Vitals:   07/12/23 0950  TempSrc: Temporal  PainSc: 0-No pain                 Jacquelyne Matte

## 2023-07-12 NOTE — Anesthesia Preprocedure Evaluation (Addendum)
 Anesthesia Evaluation  Patient identified by MRN, date of birth, ID band Patient awake    Reviewed: Allergy & Precautions, H&P , NPO status , Patient's Chart, lab work & pertinent test results, reviewed documented beta blocker date and time   Airway Mallampati: III  TM Distance: >3 FB Neck ROM: Full    Dental  (+) Teeth Intact, Dental Advisory Given   Pulmonary Current Smoker   Pulmonary exam normal breath sounds clear to auscultation       Cardiovascular hypertension (poorly controlled HTN, 163/97 in preop), Pt. on medications and Pt. on home beta blockers + CAD, + Cardiac Stents (RCA 2023) and +CHF (HCM now with normal LVEF, grade 1 diastolic dysfunction)  Normal cardiovascular exam+ dysrhythmias (NSVT s/p ICD) Supra Ventricular Tachycardia + Cardiac Defibrillator (2023)  Rhythm:Regular Rate:Normal  Last echo 03/2023  1. There is severe concentric LVH with septal prominence (septum 3.5cm  and PW 2.6cm). There is no significant LVOT gradient at rest or with  Valsalva. Left ventricular ejection fraction, by estimation, is 70 to 75%.  The left ventricle has hyperdynamic  function. The left ventricle has no regional wall motion abnormalities.  There is severe concentric left ventricular hypertrophy. Left ventricular  diastolic parameters are consistent with Grade I diastolic dysfunction  (impaired relaxation). The average  left ventricular global longitudinal strain is -24.1 %. The global  longitudinal strain is normal.   2. Right ventricular systolic function is normal. The right ventricular  size is normal.   3. Left atrial size was mildly dilated.   4. The mitral valve is normal in structure. Trivial mitral valve  regurgitation. No evidence of mitral stenosis.   5. The aortic valve is normal in structure. Aortic valve regurgitation is  mild. No aortic stenosis is present.   6. Aortic dilatation noted. There is severe dilatation  of the aortic  root, measuring 55 mm. There is moderate dilatation of the ascending  aorta, measuring 45 mm.   7. The inferior vena cava is normal in size with greater than 50%  respiratory variability, suggesting right atrial pressure of 3 mmHg.    Cath 2023 1.  Patent left main, LAD, and left circumflex, with mild nonobstructive plaquing 2.  Severe distal RCA stenosis with positive RFR of 0.78, treated successfully with PCI using a 3.5 x 24 mm Synergy DES deployed over the distal RCA and extending into the posterolateral branch       Neuro/Psych  Headaches  negative psych ROS   GI/Hepatic negative GI ROS, Neg liver ROS,,,  Endo/Other  Obesity BMI 34  Renal/GU negative Renal ROS  negative genitourinary   Musculoskeletal negative musculoskeletal ROS (+)    Abdominal   Peds negative pediatric ROS (+)  Hematology negative hematology ROS (+) Hb 15.3, plt 253   Anesthesia Other Findings   Reproductive/Obstetrics negative OB ROS                             Anesthesia Physical Anesthesia Plan  ASA: 3  Anesthesia Plan: MAC   Post-op Pain Management:    Induction:   PONV Risk Score and Plan: 2 and Propofol infusion and TIVA  Airway Management Planned: Natural Airway and Simple Face Mask  Additional Equipment: None  Intra-op Plan:   Post-operative Plan:   Informed Consent: I have reviewed the patients History and Physical, chart, labs and discussed the procedure including the risks, benefits and alternatives for the proposed anesthesia with the  patient or authorized representative who has indicated his/her understanding and acceptance.       Plan Discussed with: CRNA  Anesthesia Plan Comments:        Anesthesia Quick Evaluation

## 2023-07-12 NOTE — Interval H&P Note (Signed)
 History and Physical Interval Note:  07/12/2023 1:53 PM  Thomas Spears  has presented today for surgery, with the diagnosis of hypertrophic cardiomyopathy.  The various methods of treatment have been discussed with the patient and family. After consideration of risks, benefits and other options for treatment, the patient has consented to  Procedure(s): RIGHT/LEFT HEART CATH AND CORONARY ANGIOGRAPHY (N/A) as a surgical intervention.  The patient's history has been reviewed, patient examined, no change in status, stable for surgery.  I have reviewed the patient's chart and labs.  Questions were answered to the patient's satisfaction.     Arnoldo Lapping

## 2023-07-12 NOTE — Interval H&P Note (Signed)
 History and Physical Interval Note:  07/12/2023 8:26 AM  Thomas Spears  has presented today for surgery, with the diagnosis of Hypertrophic cardiomyopathy (HCC) [I42.2] Ascending aortic aneurysm, unspecified whether ruptured (HCC) [I71.21].  The various methods of treatment have been discussed with the patient and family. After consideration of risks, benefits and other options for treatment, the patient has consented to  Procedure(s): TRANSESOPHAGEAL ECHOCARDIOGRAM (N/A) as a surgical intervention.  The patient's history has been reviewed, patient examined, no change in status, stable for surgery.  I have reviewed the patient's chart and labs.  Questions were answered to the patient's satisfaction.     [name] A Makaiya Geerdes

## 2023-07-12 NOTE — Transfer of Care (Signed)
 Immediate Anesthesia Transfer of Care Note  Patient: Thomas Spears  Procedure(s) Performed: TRANSESOPHAGEAL ECHOCARDIOGRAM  Patient Location: PACU  Anesthesia Type:MAC  Level of Consciousness: sedated  Airway & Oxygen Therapy: Patient Spontanous Breathing  Post-op Assessment: Report given to RN  Post vital signs: Reviewed  Last Vitals:  Vitals Value Taken Time  BP 108/77   Temp    Pulse 77   Resp 20   SpO2 97     Last Pain:  Vitals:   07/12/23 0822  TempSrc:   PainSc: 0-No pain         Complications: No notable events documented.

## 2023-07-12 NOTE — Progress Notes (Signed)
 Patients TR band removed at 1715, gauze dressing applied. Right radial level 0, clean, dry, and intact.

## 2023-07-12 NOTE — Procedures (Signed)
    TRANSESOPHAGEAL ECHOCARDIOGRAM   NAME:  Thomas Spears    MRN: [id number] DOB:  1975-04-26    ADMIT DATE: 07/12/2023  INDICATIONS: Thoracic aortic aneurysm  PROCEDURE:   Informed consent was obtained prior to the procedure. The risks, benefits and alternatives for the procedure were discussed and the patient comprehended these risks.  Risks include, but are not limited to, cough, sore throat, vomiting, nausea, somnolence, esophageal and stomach trauma or perforation, bleeding, low blood pressure, aspiration, pneumonia, infection, trauma to the teeth and death.    Anesthesia was administered by Dr. Ernest Head and team.   The patient's heart rate, blood pressure, and oxygen saturation are monitored continuously during the procedure.   The transesophageal probe was inserted in the esophagus and stomach without difficulty and multiple views were obtained.   COMPLICATIONS:    There were no immediate complications.  KEY FINDINGS:  Moderate to severe thoracic aortic aneurysm 56 mm by sinus to sinus dimension.  Moderate aortic regurgitation.   Hypertrophic non obstructive cardiomyopathy with thickness greater than 30 mm.    Full report to follow. Further management per primary team.   Gloriann Larger, MD [place]  [facility]  9:31 AM

## 2023-07-14 ENCOUNTER — Encounter (HOSPITAL_COMMUNITY): Payer: Self-pay | Admitting: Internal Medicine

## 2023-08-02 ENCOUNTER — Encounter: Payer: 59 | Attending: Cardiology

## 2023-08-02 DIAGNOSIS — I5042 Chronic combined systolic (congestive) and diastolic (congestive) heart failure: Secondary | ICD-10-CM | POA: Insufficient documentation

## 2023-08-02 DIAGNOSIS — I340 Nonrheumatic mitral (valve) insufficiency: Secondary | ICD-10-CM | POA: Insufficient documentation

## 2023-08-02 DIAGNOSIS — I7121 Aneurysm of the ascending aorta, without rupture: Secondary | ICD-10-CM | POA: Insufficient documentation

## 2023-08-02 DIAGNOSIS — I428 Other cardiomyopathies: Secondary | ICD-10-CM | POA: Insufficient documentation

## 2023-08-02 DIAGNOSIS — I422 Other hypertrophic cardiomyopathy: Secondary | ICD-10-CM

## 2023-08-02 DIAGNOSIS — I2510 Atherosclerotic heart disease of native coronary artery without angina pectoris: Secondary | ICD-10-CM | POA: Insufficient documentation

## 2023-08-02 DIAGNOSIS — I10 Essential (primary) hypertension: Secondary | ICD-10-CM | POA: Insufficient documentation

## 2023-08-02 DIAGNOSIS — Z95810 Presence of automatic (implantable) cardiac defibrillator: Secondary | ICD-10-CM | POA: Insufficient documentation

## 2023-08-02 DIAGNOSIS — I351 Nonrheumatic aortic (valve) insufficiency: Secondary | ICD-10-CM | POA: Insufficient documentation

## 2023-08-02 DIAGNOSIS — I4729 Other ventricular tachycardia: Secondary | ICD-10-CM | POA: Insufficient documentation

## 2023-08-02 LAB — CUP PACEART REMOTE DEVICE CHECK
Battery Remaining Longevity: 174 mo
Battery Remaining Percentage: 100 %
Brady Statistic RV Percent Paced: 0 %
Date Time Interrogation Session: 20250606020100
HighPow Impedance: 93 Ohm
Implantable Lead Connection Status: 753985
Implantable Lead Implant Date: 20230906
Implantable Lead Location: 753860
Implantable Lead Model: 673
Implantable Lead Serial Number: 201402
Implantable Pulse Generator Implant Date: 20230906
Lead Channel Impedance Value: 461 Ohm
Lead Channel Pacing Threshold Amplitude: 0.7 V
Lead Channel Pacing Threshold Pulse Width: 0.4 ms
Lead Channel Setting Pacing Amplitude: 2.5 V
Lead Channel Setting Pacing Pulse Width: 0.4 ms
Lead Channel Setting Sensing Sensitivity: 0.5 mV
Pulse Gen Serial Number: 316727
Zone Setting Status: 755011

## 2023-08-12 ENCOUNTER — Ambulatory Visit: Payer: Self-pay | Admitting: Cardiology

## 2023-08-14 ENCOUNTER — Encounter: Payer: Self-pay | Admitting: Surgery

## 2023-08-19 NOTE — Telephone Encounter (Signed)
 Patient portion received. Indexing provider portion to chart media to have clinic get provider to sign and date their portion.

## 2023-08-19 NOTE — Telephone Encounter (Signed)
 Oh no! My apologies! Those were supposed to be blank forms. If not fixed on paper we can always fix it in adobe once it gets faxed back to us . Appreciate your help!

## 2023-08-19 NOTE — Telephone Encounter (Signed)
 Please have provider sign and date both pages of Jardiance  application (see chart media). Please fax completed forms to [phone].

## 2023-08-21 NOTE — Telephone Encounter (Signed)
 MD signature obtained and faxed to [phone].  Confirmation attached.  To:               Recipient at [phone] Subject:          Fw: Pt assistance Result:           The transmission was successful. Explanation:      All Pages Ok Pages Sent:       4

## 2023-08-21 NOTE — Telephone Encounter (Signed)
 Paperwork received and reviewed. PAP: Application for Jardiance  has been submitted to [employer]), via fax. If patient requests an update in the meantime, please refer them to [facility] directly at [phone].

## 2023-08-28 ENCOUNTER — Ambulatory Visit: Payer: Self-pay | Attending: Surgery | Admitting: Surgery

## 2023-08-28 ENCOUNTER — Encounter: Payer: Self-pay | Admitting: *Deleted

## 2023-08-28 VITALS — BP 123/80 | HR 72 | Resp 20 | Ht 69.0 in | Wt 225.0 lb

## 2023-08-28 DIAGNOSIS — I7121 Aneurysm of the ascending aorta, without rupture: Secondary | ICD-10-CM

## 2023-08-28 DIAGNOSIS — I2510 Atherosclerotic heart disease of native coronary artery without angina pectoris: Secondary | ICD-10-CM

## 2023-08-28 DIAGNOSIS — I712 Thoracic aortic aneurysm, without rupture, unspecified: Secondary | ICD-10-CM

## 2023-08-30 NOTE — Progress Notes (Signed)
 [address] [place] [address]             [id number]    HPI:  The patient is a 48 year old gentleman who had previously been followed by Dr. 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  He has a history of coronary disease status post PCI of a 90% distal RCA bifurcation stenosis.  I last saw him on 10/24/2022 after Dr. Glady retirement.  CT of the chest at that time showed a 4.8 cm aortic root with significant motion artifact.  The sinotubular junction was measured at 4.6 x 5.0 cm and the ascending aorta at 4.1 cm with the descending aorta at 3.5 cm.  He had a 2D echocardiogram on 04/12/2023 showing severe concentric LVH with septal prominence measured at 3.5 cm with a posterior wall of 2.6 cm.  There was no significant LVOT gradient at rest or with Valsalva maneuver.  Left ventricular ejection fraction was 70 to 75%.  Aortic root was measured at 5.5 cm on echocardiogram with the ascending aorta measuring 4.5 cm.  The aortic valve appeared to be a normal trileaflet valve with mild insufficiency.  The patient underwent a cardiac MRI on 06/12/2023 which suggested a hypertrophic cardiomyopathy with no LVOT obstruction.  The maximal septal thickness was measured at 31 mm.  There is mild dilation of the LV cavity at 58 mm during diastole.  There is normal right ventricular size and thickness.  Right ventricular ejection fraction was 51%.  The ascending aorta was measured at 4.9 cm with the aortic root measured at 5.5 cm.  There was moderate aortic insufficiency with a regurgitant fraction of 26%.  There is also moderate pulmonary valve insufficiency with a regurgitant fraction of 22%.  There is no significant tricuspid or mitral regurgitation.  He subsequently underwent a TEE on 07/12/2023 showing a trileaflet aortic valve with moderate insufficiency.  The aortic root was measured at 5.4 cm in 2D and 5.7 cm in 3D from sinus to sinus.  Left ventricular  ejection fraction was 70 to 75% with asymmetric LVH of the septal segment.  Cardiac catheterization on 07/12/2023 showed single-vessel coronary disease with 80% mid RCA stenosis and a patent stent in the distal RCA.  There is otherwise nonobstructive disease.  Right heart hemodynamics were normal with an LVEDP of 16.  He continues to feel well with no shortness of breath or chest discomfort.  He denies any exertional fatigue.  He has had no dizziness or syncope.  He denies orthopnea and peripheral edema.  Current Outpatient Medications  Medication Sig Dispense Refill   acetaminophen  (TYLENOL ) 500 MG tablet Take 2 tablets (1,000 mg total) by mouth every 8 (eight) hours as needed for up to 30 doses for mild pain or fever. 60 tablet 0   amLODipine  (NORVASC ) 5 MG tablet Take 1 tablet (5 mg total) by mouth daily. 180 tablet 3   aspirin  EC 81 MG tablet Take 1 tablet (81 mg total) by mouth every evening. Swallow whole. 30 tablet 3   atorvastatin  (LIPITOR) 80 MG tablet Take 1 tablet (80 mg total) by mouth daily. 90 tablet 3   carvedilol  (COREG ) 25 MG tablet Take 1.5 tablets (37.5 mg total) by mouth 2 (two) times daily. 270 tablet 3   empagliflozin  (JARDIANCE ) 10 MG TABS tablet Take 1 tablet (10 mg total) by mouth daily before breakfast. 30 tablet 11   hydrALAZINE  (APRESOLINE ) 25 MG tablet Take  1 tablet (25 mg total) by mouth 3 (three) times daily. 270 tablet 1   isosorbide  mononitrate (IMDUR ) 30 MG 24 hr tablet Take 30 mg by mouth daily.     pantoprazole  (PROTONIX ) 40 MG tablet Take 1 tablet (40 mg total) by mouth daily. 30 tablet 7   sacubitril -valsartan  (ENTRESTO ) 97-103 MG Take 1 tablet by mouth 2 (two) times daily. 180 tablet 3   spironolactone  (ALDACTONE ) 25 MG tablet Take 1 tablet (25 mg total) by mouth daily. 90 tablet 2   No current facility-administered medications for this visit.     Physical Exam: BP 123/80   Pulse 72   Resp 20   Ht 5' 9 (1.753 m)   Wt 225 lb (102.1 kg)   SpO2 94%  Comment: RA  BMI 33.23 kg/m  He looks well. Cardiac exam shows a regular rate and rhythm with a 2/6 systolic along the right lower sternal border and 2/6 diastolic murmur at apex. Lungs clear. There is no peripheral edema   Diagnostic Tests:  TRANSESOPHOGEAL ECHO REPORT       Patient Name:   Thomas Spears Date of Exam: 07/12/2023  Medical Rec #:  [id number]        Height:       68.0 in  Accession #:    [id number]       Weight:       226.0 lb  Date of Birth:  02/20/1976        BSA:          2.153 m  Patient Age:    47 years         BP:           154/95 mmHg  Patient Gender: M                HR:           71 bpm.  Exam Location:  Outpatient   Procedure: Cardiac Doppler, Color Doppler, Transesophageal Echo and 3D  Echo            (Both Spectral and Color Flow Doppler were utilized during             procedure).   Indications:     Aortic Insufficiency    History:         Patient has prior history of Echocardiogram examinations,  most                  recent 05/23/2020.    Sonographer:     Tinnie Gosling RDCS  Referring Phys:  [id number] [facility] A SANTO  Diagnosing Phys: Stanly SANTO MD   PROCEDURE: The transesophogeal probe was passed without difficulty through  the esophogus of the patient. Sedation performed by different physician.  The patient developed no complications during the procedure.    IMPRESSIONS     1. Maximal sepal thickness 3.3 cm. No systolic anterio motion of the  mitral valve. Left ventricular ejection fraction, by estimation, is 70 to  75%. The left ventricle has hyperdynamic function. There is severe  asymmetric left ventricular hypertrophy of  the septal segment.   2. Right ventricular systolic function is normal. The right ventricular  size is normal.   3. No left atrial/left atrial appendage thrombus was detected. The LAA  emptying velocity was 70 cm/s.   4. The mitral valve is normal in structure. No evidence of mitral valve   regurgitation. No evidence of mitral stenosis.   5. The aortic valve  is tricuspid. Aortic valve regurgitation is moderate.  No aortic stenosis is present.   6. Severe aortic dilation: By Sinus to sinus measurements maximal 2D  measurement 54 mm. Mild ascending aortic dilation. Aortic dilatation  noted.   7. 3D performed of the aortic valve and demonstrates Tri-leaflet VAlve.  Maximal sinus to sinus diameter 5.7 cm.   8. Evidence of atrial level shunting detected by color flow Doppler.   FINDINGS   Left Ventricle: Maximal sepal thickness 3.3 cm. No systolic anterio  motion of the mitral valve. Left ventricular ejection fraction, by  estimation, is 70 to 75%. The left ventricle has hyperdynamic function.  The left ventricular internal cavity size was  normal in size. There is severe asymmetric left ventricular hypertrophy of  the septal segment.   Right Ventricle: The right ventricular size is normal. No increase in  right ventricular wall thickness. Right ventricular systolic function is  normal.   Left Atrium: Left atrial size was normal in size. No left atrial/left  atrial appendage thrombus was detected. The LAA emptying velocity was 70  cm/s.   Right Atrium: Right atrial size was normal in size.   Pericardium: There is no evidence of pericardial effusion.   Mitral Valve: The mitral valve is normal in structure. No evidence of  mitral valve regurgitation. No evidence of mitral valve stenosis.   Tricuspid Valve: The tricuspid valve is normal in structure. Tricuspid  valve regurgitation is mild . No evidence of tricuspid stenosis.   Aortic Valve: The aortic valve is tricuspid. Aortic valve regurgitation is  moderate. No aortic stenosis is present.   Pulmonic Valve: The pulmonic valve was normal in structure. Pulmonic valve  regurgitation is not visualized. No evidence of pulmonic stenosis.   Aorta: Severe aortic dilation: By Sinus to sinus measurements maximal 2D   measurement 54 mm. Mild ascending aortic dilation. Aortic dilatation  noted.   IAS/Shunts: Evidence of atrial level shunting detected by color flow  Doppler.   Additional Comments: 3D was performed not requiring image post processing  on an independent workstation and was indeterminate. A device lead is  visualized in the right ventricle.   LEFT VENTRICLE  PLAX 2D  LVOT diam:     2.40 cm  LV SV:         100  LV SV Index:   46  LVOT Area:     4.52 cm     AORTIC VALVE  LVOT Vmax:   122.00 cm/s  LVOT Vmean:  87.200 cm/s  LVOT VTI:    0.221 m    AORTA  Ao Root diam: 5.40 cm  Ao Asc diam:  4.70 cm     SHUNTS  Systemic VTI:  0.22 m  Systemic Diam: 2.40 cm   Stanly Leavens MD  Electronically signed by Stanly Leavens MD  Signature Date/Time: 07/12/2023/1:21:49 PM        Final      Narrative & Impression  CLINICAL DATA:  Clinical question of hypertrophic cardiomyopathy Study assumes BSA of 2.24 m2.   EXAM: CARDIAC MRI   TECHNIQUE: The patient was scanned on a 1.5 Tesla GE magnet. A dedicated cardiac coil was used. Functional imaging was done using Fiesta sequences. 2,3, and 4 chamber views were done to assess for RWMA's. Modified Simpson's rule using was used to calculate an ejection fraction on a dedicated work [employer]. The patient received 10 cc of Gadavist . After 10 minutes inversion recovery sequences were used to assess for infiltration  and scar tissue. Flow quantification was performed 2 times during this examination with flow quantification performed at the levels of the ascending aorta above the valve, pulmonary artery above the valve.   CONTRAST:  10 cc  of Gadavist    FINDINGS: 1. Mild dilation of left ventricular size, with LVEDD 58 mm, but LVEDVi 114 mL/m2.   Study suggestive of hypertrophic cardiomyopathy. No LVOT obstruction. Sigmoid septum. Severe hypertrophy with maximal septal thickness 31 mm.   Low  normal left ventricular systolic function (LVEF =51%). There are no regional wall motion abnormalities.   There is late gadolinium enhancement in the left ventricular myocardium. Patchy LGE that predominates on the anterolateral LV myocardium. Device lead artifact.   2. Normal right ventricular size with RVEDVI 74 mL/m2.   Normal right ventricular thickness.   Normal right ventricular systolic function (RVEF =51%). There are no regional wall motion abnormalities or aneurysms.   There is a device lead seen in the left atrium and left ventricle. Course is not well evaluated in this study.   3.  Normal left atrial size.  Mild right atrial dilation.   4. Moderate ascending aortic aneurysm 49 mm. Severe root dilation, 55 mm. Root is not well visualized. Normal main pulmonary artery caliber.   5. Valve assessment:   Aortic Valve: Valve morphology not well assessed. There is moderate regurgitation. Regurgitant fraction 26%.   Pulmonic Valve: There is moderate regurgitation. Regurgitant fraction 22%.   Tricuspid Valve: No significant regurgitation.   Mitral Valve: No significant regurgitation.   6.  Normal pericardium.  No pericardial effusion.   7. Grossly, no extracardiac findings. Recommended dedicated study if concerned for non-cardiac pathology.   8. Device lead artifacts noted. Unable to quantify LGE. Unable to perform parametric mapping.   IMPRESSION: 1. Study suggestive of hypertrophic cardiomyopathy with maximal thickness 31 mm. Parametric mapping not well visualized due to device lead artifact. LGE qualitatively noted. CIED lead noted.   2. Low normal left ventricular systolic function (LVEF =51%) with mild dilation of left ventricular size my volume.   3. Severe aortic root aneurysm (55 mm), with moderate ascending aortic dilation (49 mm) and moderate aortic regurgitation.   Stanly Leavens MD     Electronically Signed   By: Stanly Leavens  M.D.   On: 06/12/2023 17:30   Physicians  Panel Physicians Referring Physician Case Authorizing Physician  Wonda Sharper, MD (Primary)     Procedures  RIGHT/LEFT HEART CATH AND CORONARY ANGIOGRAPHY   Conclusion      Mid LM to Dist LM lesion is 20% stenosed.   Ost LAD to Prox LAD lesion is 30% stenosed.   Mid LAD lesion is 45% stenosed.   Prox RCA lesion is 30% stenosed.   Prox RCA to Mid RCA lesion is 80% stenosed.   Prox Cx to Mid Cx lesion is 30% stenosed.   Non-stenotic Dist RCA lesion was previously treated.   There is moderate (3+) aortic regurgitation.   1.  Single-vessel coronary artery disease with severe mid RCA stenosis and patent stent in the distal RCA 2.  Patent left main, left circumflex, and LAD with mild diffuse nonobstructive plaquing 3.  Essentially normal right heart hemodynamics with right atrial pressure of 5 mmHg, PA pressure of 28/12 mean 16 mmHg, LVEDP of 16 mmHg, and preserved cardiac output of 6 L/min. 4.  Aortic root aneurysm with moderate aortic valve insufficiency   Patient pending surgical referral.  If he does not undergo cardiac surgery, he would  be a candidate for stenting of the mid RCA.   Indications  Aneurysm of ascending aorta without rupture (HCC) [P28.78 (ICD-10-CM)]   Procedural Details  Technical Details INDICATION: Patient with hypertrophic cardiomyopathy, aortic root aneurysm, and coronary artery disease with prior RCA stenting, presenting for preoperative right and left heart catheterization before referral to cardiac surgery.  PROCEDURAL DETAILS: There was an indwelling IV in a right antecubital vein. Using normal sterile technique, the IV was changed out for a 5 Fr brachial sheath over a 0.018 inch wire. The right wrist was then prepped, draped, and anesthetized with 1% lidocaine . Using the modified Seldinger technique a 5/6 French Slender sheath was placed in the right radial artery. Intra-arterial verapamil  was administered  through the radial artery sheath. IV heparin  was administered after a JR4 catheter was advanced into the central aorta. A Swan-Ganz catheter was used for the right heart catheterization. Standard protocol was followed for recording of right heart pressures and sampling of oxygen saturations. Fick cardiac output was calculated. Standard Judkins catheters were used for selective coronary angiography. LV pressure is recorded and an aortic valve pullback is performed.  Aortic root angiography is performed via power injection.  There were no immediate procedural complications. The patient was transferred to the post catheterization recovery area for further monitoring.      Estimated blood loss <50 mL.   During this procedure medications were administered to achieve and maintain moderate conscious sedation while the patient's heart rate, blood pressure, and oxygen saturation were continuously monitored and I was present face-to-face 100% of this time. Lamar Moats Rad Tech and [place] RN are independent, trained observers who assisted in the monitoring of the patient's level of consciousness.   Medications (Filter: Administrations occurring from 1351 to 1500 on 07/12/23) Heparin  (Porcine) in NaCl 2000-0.9 UNIT/L-% SOLN (mL)  Total volume: 1,000 mL Date/Time Rate/Dose/Volume Action   07/12/23 1400 1,000 mL Given   midazolam  (VERSED ) injection (mg)  Total dose: 2 mg Date/Time Rate/Dose/Volume Action   07/12/23 1416 2 mg Given   fentaNYL  (SUBLIMAZE ) injection (mcg)  Total dose: 25 mcg Date/Time Rate/Dose/Volume Action   07/12/23 1416 25 mcg Given   lidocaine  (PF) (XYLOCAINE ) 1 % injection (mL)  Total volume: 4 mL Date/Time Rate/Dose/Volume Action   07/12/23 1419 2 mL Given   1422 2 mL Given   heparin  sodium (porcine) injection (Units)  Total dose: 5,000 Units Date/Time Rate/Dose/Volume Action   07/12/23 1433 5,000 Units Given   Radial Cocktail/Verapamil  only (mL)  Total volume: 10  mL Date/Time Rate/Dose/Volume Action   07/12/23 1424 10 mL Given   iohexol  (OMNIPAQUE ) 350 MG/ML injection (mL)  Total volume: 80 mL Date/Time Rate/Dose/Volume Action   07/12/23 1455 80 mL Given    Sedation Time  Sedation Time Physician-1: 31 minutes 19 seconds Radiation/Fluoro  Fluoro time: 6.9 (min) DAP: 28530 (mGycm2) Cumulative Air Kerma: 497 (mGy) Complications  Complications documented before study signed (07/12/2023  3:20 PM)   No complications were associated with this study.  Documented by Shauna Dallas SQUIBB, RN - 07/12/2023  3:00 PM     Coronary Findings  Diagnostic Dominance: Right Left Main  Vessel is large.  Mid LM to Dist LM lesion is 20% stenosed. The lesion is focal and concentric.    Left Anterior Descending  There is mild diffuse disease throughout the vessel. The LAD is patent to the apex with mild nonobstructive plaquing at the proximal and mid vessel. The diagonal branches are patent.  Ost LAD to Prox  LAD lesion is 30% stenosed. The lesion is focal and concentric.  Mid LAD lesion is 45% stenosed. The lesion is located at the bifurcation and eccentric.    Second Diagonal Branch  Vessel is small in size.    Third Diagonal Branch  Vessel is small in size.    Left Circumflex  Vessel is large. The vessel exhibits minimal luminal irregularities.  Prox Cx to Mid Cx lesion is 30% stenosed.    First Obtuse Marginal Branch  Vessel is small in size. Vessel is angiographically normal. The circumflex is patent with mild nonobstructive plaquing. The OM branches are patent.    Second Obtuse Marginal Branch  Vessel is small in size.    First Left Posterolateral Branch  Vessel is small in size.    Left Posterior Atrioventricular Artery  Vessel is small in size.    Right Coronary Artery  Vessel is large. There is moderate focal disease in the vessel. The vessel is moderately tortuous. Dominant vessel with diffuse plaquing. The distal stented segment is  patent. The ostial PDA has 50% stenosis. The mid RCA has an eccentric 80% stenosis new from the last catheterization study.  Prox RCA lesion is 30% stenosed. The lesion is eccentric.  Prox RCA to Mid RCA lesion is 80% stenosed. The lesion is eccentric.  Non-stenotic Dist RCA lesion was previously treated. The lesion is discrete and concentric.    Acute Marginal Branch  Vessel is small in size.    Right Ventricular Branch  Vessel is small in size.    First Right Posterolateral Branch  Vessel is moderate in size.    Second Right Posterolateral Branch  Vessel is large in size.    Intervention   No interventions have been documented.   Left Heart  Aortic Valve There is moderate (3+) aortic regurgitation.  Aorta Aortic Root:  The aortic root is aneurysmal. Sinus of Valsalva is abnormal.   Coronary Diagrams  Diagnostic Dominance: Right  Intervention   Implants   No implant documentation for this case.   Syngo Images   Show images for CARDIAC CATHETERIZATION Images on Long Term Storage   Show images for Coren, Sagan to Procedure Log  Procedure Log    Hemo Data  Flowsheet Row Most Recent Value  Fick Cardiac Output 5.98 L/min  Fick Cardiac Output Index 2.78 (L/min)/BSA  RA A Wave 10 mmHg  RA V Wave 10 mmHg  RA Mean 5 mmHg  RV Systolic Pressure 29 mmHg  RV Diastolic Pressure 2 mmHg  RV EDP 12 mmHg  PA Systolic Pressure 28 mmHg  PA Diastolic Pressure 12 mmHg  PA Mean 16 mmHg  AO Systolic Pressure 115 mmHg  AO Diastolic Pressure 81 mmHg  AO Mean 98 mmHg  LV Systolic Pressure 117 mmHg  LV Diastolic Pressure 6 mmHg  LV EDP 16 mmHg  Arterial Occlusion Pressure Extended Systolic Pressure 134 mmHg  Arterial Occlusion Pressure Extended Diastolic Pressure 79 mmHg  Arterial Occlusion Pressure Extended Mean Pressure 98 mmHg  Left Ventricular Apex Extended Systolic Pressure 126 mmHg  LVp Diastolic Pressure 1 mmHg  Left Ventricular Apex Extended EDP Pressure  12 mmHg  QP/QS 1  TPVR Index 5.76 HRUI  TSVR Index 35.26 HRUI  TPVR/TSVR Ratio 0.16    Impression:  This 48 year old gentleman has an aortic root aneurysm that had been stable at 4.8 cm but now was measured at 5.5 cm on cardiac MRI and 5.4 to 5.7 cm on TEE with a 4.5 cm ascending aorta.  He has a trileaflet aortic valve with moderate aortic insufficiency that appears to be central.  He has severe concentric LVH with the most prominent portion being the septal segment at 3.1 cm on cardiac MR with no sign of LVOT to obstruction on echocardiogram.  Cardiac catheterization shows an 80% mid RCA stenosis.  He is not having any chest pain or shortness of breath or fatigue.  His left ventricle is hyperdynamic.  I think the only indication for surgery at this time would be his aortic root aneurysm measuring 5.5 cm which is the usual surgical threshold.  He is relatively young with and I think he would benefit by having this repaired to decrease the risk of aortic dissection.  His aortic valve may be suitable for valve sparing aortic root replacement depending on intraoperative findings.  If it required replacement he should have a mechanical valve at his young age. He would also benefit from bypass of the RCA.  I do not think there is any clear indication for myectomy since his LVH is clearly concentric and severe and there is no LVOT obstruction. Septal myectomy is not going to change anything at this time and would increase the operative risk.  I reviewed all the studies including the images with the patient and answered his questions.  He is going to think about it and discuss it with his wife before deciding about surgical treatment.  Plan:  He will call us  back if he decides to proceed with surgery to include replacement of the ascending aorta under hypothermic circulatory arrest, Bentall procedure with a mechanical valve conduit versus valve sparing aortic placement, coronary bypass RCA. If he decides not  to proceed with surgery at this time he should have a repeat CTA of the chest/aorta with gating in 6 months. I stressed the importance of continued good blood pressure control in preventing further enlargement and acute aortic dissection.  I advised him against doing any heavy lifting that may require a Valsalva maneuver and could suddenly raise his blood pressure to high levels.    I spent 30 minutes performing this established patient evaluation and > 50% of this time was spent face to face counseling and coordinating the care of this patient's aortic aneurysm.    Dorise MARLA Fellers, MD [facility] [phone]

## 2023-09-12 NOTE — Telephone Encounter (Signed)
 PAP: Patient assistance application for Jardiance  has been approved by PAP Companies: [employer] from 08/22/23 to 08/21/24. Medication should be delivered to PAP Delivery: Home. For further shipping updates, please contact [employer] ([employer]) at [phone]. Patient ID is: [id number] Letter sent to pt.

## 2023-09-17 NOTE — Progress Notes (Signed)
 Remote ICD transmission.

## 2023-10-23 ENCOUNTER — Encounter: Payer: Self-pay | Admitting: Internal Medicine

## 2023-10-23 DIAGNOSIS — K921 Melena: Secondary | ICD-10-CM

## 2023-10-23 DIAGNOSIS — I340 Nonrheumatic mitral (valve) insufficiency: Secondary | ICD-10-CM

## 2023-10-23 DIAGNOSIS — I5042 Chronic combined systolic (congestive) and diastolic (congestive) heart failure: Secondary | ICD-10-CM

## 2023-10-23 DIAGNOSIS — I7121 Aneurysm of the ascending aorta, without rupture: Secondary | ICD-10-CM

## 2023-10-23 DIAGNOSIS — I428 Other cardiomyopathies: Secondary | ICD-10-CM

## 2023-10-23 DIAGNOSIS — I422 Other hypertrophic cardiomyopathy: Secondary | ICD-10-CM

## 2023-10-23 DIAGNOSIS — I10 Essential (primary) hypertension: Secondary | ICD-10-CM

## 2023-10-23 DIAGNOSIS — Z95810 Presence of automatic (implantable) cardiac defibrillator: Secondary | ICD-10-CM

## 2023-10-23 DIAGNOSIS — R519 Headache, unspecified: Secondary | ICD-10-CM

## 2023-10-23 DIAGNOSIS — I2510 Atherosclerotic heart disease of native coronary artery without angina pectoris: Secondary | ICD-10-CM

## 2023-10-23 DIAGNOSIS — I351 Nonrheumatic aortic (valve) insufficiency: Secondary | ICD-10-CM

## 2023-10-24 ENCOUNTER — Other Ambulatory Visit (HOSPITAL_COMMUNITY): Payer: Self-pay

## 2023-10-24 ENCOUNTER — Telehealth: Payer: Self-pay

## 2023-10-24 MED ORDER — SACUBITRIL-VALSARTAN 97-103 MG PO TABS
1.0000 | ORAL_TABLET | Freq: Two times a day (BID) | ORAL | 3 refills | Status: DC
  Filled 2023-10-24: qty 180, 90d supply, fill #0
  Filled 2023-11-15: qty 60, 30d supply, fill #0
  Filled 2024-02-28: qty 60, 30d supply, fill #1

## 2023-10-24 NOTE — Telephone Encounter (Signed)
 Staff onsite again 10/25/23. Will mail [employer] app then.

## 2023-10-25 ENCOUNTER — Other Ambulatory Visit (HOSPITAL_COMMUNITY): Payer: Self-pay

## 2023-10-30 NOTE — Telephone Encounter (Signed)
 PAP: Patient assistance application for Entresto  through [employer] has been mailed to pt's home address on file. Provider portion of application will be faxed to provider's office once pt portion has been received.

## 2023-11-01 ENCOUNTER — Encounter: Payer: 59 | Attending: Cardiology

## 2023-11-01 DIAGNOSIS — I422 Other hypertrophic cardiomyopathy: Secondary | ICD-10-CM | POA: Insufficient documentation

## 2023-11-02 LAB — CUP PACEART REMOTE DEVICE CHECK
Battery Remaining Longevity: 168 mo
Battery Remaining Percentage: 100 %
Brady Statistic RV Percent Paced: 0 %
Date Time Interrogation Session: 20250905020100
HighPow Impedance: 84 Ohm
Implantable Lead Connection Status: 753985
Implantable Lead Implant Date: 20230906
Implantable Lead Location: 753860
Implantable Lead Model: 673
Implantable Lead Serial Number: 201402
Implantable Pulse Generator Implant Date: 20230906
Lead Channel Impedance Value: 436 Ohm
Lead Channel Pacing Threshold Amplitude: 0.7 V
Lead Channel Pacing Threshold Pulse Width: 0.4 ms
Lead Channel Setting Pacing Amplitude: 2.5 V
Lead Channel Setting Pacing Pulse Width: 0.4 ms
Lead Channel Setting Sensing Sensitivity: 0.5 mV
Pulse Gen Serial Number: 316727
Zone Setting Status: 755011

## 2023-11-03 ENCOUNTER — Ambulatory Visit: Payer: Self-pay | Admitting: Cardiology

## 2023-11-11 NOTE — Telephone Encounter (Signed)
 2ND ATTEMPT   PAP: Patient assistance application for Entresto  through [employer] has been mailed to pt's home address on file. Provider portion of application will be faxed to provider's office once pt portion has been received.

## 2023-11-14 NOTE — Progress Notes (Signed)
Remote ICD Transmission.

## 2023-11-15 ENCOUNTER — Other Ambulatory Visit (HOSPITAL_COMMUNITY): Payer: Self-pay

## 2023-11-25 NOTE — Telephone Encounter (Signed)
 Unable to obtain requested information from patient after multiple attempts, closing encounter.

## 2024-01-31 ENCOUNTER — Encounter: Payer: 59 | Attending: Cardiology

## 2024-01-31 DIAGNOSIS — I422 Other hypertrophic cardiomyopathy: Secondary | ICD-10-CM | POA: Insufficient documentation

## 2024-02-02 LAB — CUP PACEART REMOTE DEVICE CHECK
Battery Remaining Longevity: 168 mo
Battery Remaining Percentage: 100 %
Brady Statistic RV Percent Paced: 0 %
Date Time Interrogation Session: 20251205020100
HighPow Impedance: 80 Ohm
Implantable Lead Connection Status: 753985
Implantable Lead Implant Date: 20230906
Implantable Lead Location: 753860
Implantable Lead Model: 673
Implantable Lead Serial Number: 201402
Implantable Pulse Generator Implant Date: 20230906
Lead Channel Impedance Value: 446 Ohm
Lead Channel Pacing Threshold Amplitude: 0.7 V
Lead Channel Pacing Threshold Pulse Width: 0.4 ms
Lead Channel Setting Pacing Amplitude: 2.5 V
Lead Channel Setting Pacing Pulse Width: 0.4 ms
Lead Channel Setting Sensing Sensitivity: 0.5 mV
Pulse Gen Serial Number: 316727
Zone Setting Status: 755011

## 2024-02-04 NOTE — Progress Notes (Signed)
 Remote ICD Transmission

## 2024-02-05 ENCOUNTER — Ambulatory Visit: Payer: Self-pay | Admitting: Cardiology

## 2024-02-28 ENCOUNTER — Other Ambulatory Visit (HOSPITAL_COMMUNITY): Payer: Self-pay

## 2024-03-05 ENCOUNTER — Encounter: Payer: Self-pay | Admitting: Internal Medicine

## 2024-03-05 DIAGNOSIS — I340 Nonrheumatic mitral (valve) insufficiency: Secondary | ICD-10-CM

## 2024-03-05 DIAGNOSIS — I428 Other cardiomyopathies: Secondary | ICD-10-CM

## 2024-03-05 DIAGNOSIS — I422 Other hypertrophic cardiomyopathy: Secondary | ICD-10-CM

## 2024-03-05 DIAGNOSIS — I7121 Aneurysm of the ascending aorta, without rupture: Secondary | ICD-10-CM

## 2024-03-05 DIAGNOSIS — Z95810 Presence of automatic (implantable) cardiac defibrillator: Secondary | ICD-10-CM

## 2024-03-05 DIAGNOSIS — Z79899 Other long term (current) drug therapy: Secondary | ICD-10-CM

## 2024-03-05 DIAGNOSIS — I351 Nonrheumatic aortic (valve) insufficiency: Secondary | ICD-10-CM

## 2024-03-05 DIAGNOSIS — I5042 Chronic combined systolic (congestive) and diastolic (congestive) heart failure: Secondary | ICD-10-CM

## 2024-03-05 DIAGNOSIS — I2510 Atherosclerotic heart disease of native coronary artery without angina pectoris: Secondary | ICD-10-CM

## 2024-03-05 DIAGNOSIS — I10 Essential (primary) hypertension: Secondary | ICD-10-CM

## 2024-03-05 DIAGNOSIS — R519 Headache, unspecified: Secondary | ICD-10-CM

## 2024-03-05 DIAGNOSIS — K921 Melena: Secondary | ICD-10-CM

## 2024-03-06 ENCOUNTER — Other Ambulatory Visit (HOSPITAL_COMMUNITY): Payer: Self-pay

## 2024-03-06 ENCOUNTER — Telehealth: Payer: Self-pay | Admitting: Internal Medicine

## 2024-03-06 ENCOUNTER — Other Ambulatory Visit (HOSPITAL_BASED_OUTPATIENT_CLINIC_OR_DEPARTMENT_OTHER): Payer: Self-pay

## 2024-03-06 MED ORDER — SACUBITRIL-VALSARTAN 97-103 MG PO TABS
1.0000 | ORAL_TABLET | Freq: Two times a day (BID) | ORAL | 0 refills | Status: AC
  Filled 2024-03-06: qty 180, 90d supply, fill #0

## 2024-03-06 MED ORDER — ISOSORBIDE MONONITRATE ER 30 MG PO TB24
30.0000 mg | ORAL_TABLET | Freq: Every day | ORAL | 0 refills | Status: AC
  Filled 2024-03-06: qty 90, 90d supply, fill #0

## 2024-03-06 MED ORDER — SPIRONOLACTONE 25 MG PO TABS
25.0000 mg | ORAL_TABLET | Freq: Every day | ORAL | 0 refills | Status: AC
  Filled 2024-03-06: qty 90, 90d supply, fill #0

## 2024-03-06 MED ORDER — CARVEDILOL 25 MG PO TABS
37.5000 mg | ORAL_TABLET | Freq: Two times a day (BID) | ORAL | 0 refills | Status: AC
  Filled 2024-03-06: qty 270, 90d supply, fill #0

## 2024-03-06 MED ORDER — CARVEDILOL 25 MG PO TABS
37.5000 mg | ORAL_TABLET | Freq: Two times a day (BID) | ORAL | 0 refills | Status: AC
  Filled 2024-03-06 (×2): qty 270, 90d supply, fill #0

## 2024-03-06 MED ORDER — AMLODIPINE BESYLATE 5 MG PO TABS
5.0000 mg | ORAL_TABLET | Freq: Every day | ORAL | 0 refills | Status: AC
  Filled 2024-03-06: qty 90, 90d supply, fill #0

## 2024-03-06 MED ORDER — ISOSORBIDE MONONITRATE ER 30 MG PO TB24
30.0000 mg | ORAL_TABLET | Freq: Every day | ORAL | 0 refills | Status: AC
  Filled 2024-03-06 (×2): qty 90, 90d supply, fill #0

## 2024-03-06 MED ORDER — HYDRALAZINE HCL 25 MG PO TABS
25.0000 mg | ORAL_TABLET | Freq: Three times a day (TID) | ORAL | 0 refills | Status: AC
  Filled 2024-03-06: qty 270, 90d supply, fill #0

## 2024-03-06 MED ORDER — ATORVASTATIN CALCIUM 80 MG PO TABS
80.0000 mg | ORAL_TABLET | Freq: Every day | ORAL | 0 refills | Status: AC
  Filled 2024-03-06: qty 90, 90d supply, fill #0

## 2024-03-06 NOTE — Telephone Encounter (Signed)
 RX sent in.

## 2024-03-06 NOTE — Telephone Encounter (Signed)
 Pt is requesting a refill on medication pantoprazole . Dr. Fernande prescribed this medication. Would Dr. Santo like to refill this medication for the pt? Please advise

## 2024-03-06 NOTE — Telephone Encounter (Signed)
" °*  STAT* If patient is at the pharmacy, call can be transferred to refill team.   1. Which medications need to be refilled? (please list name of each medication and dose if known) carvedilol  (COREG ) 25 MG tablet  isosorbide  mononitrate (IMDUR ) 30 MG 24 hr tablet   2. Would you like to learn more about the convenience, safety, & potential cost savings by using the [facility]?     3. Are you open to using the [facility] (Type [facility]. ).   4. Which pharmacy/location (including street and city if local pharmacy) is medication to be sent to?  [facility]     5. Do they need a 30 day or 90 day supply? 90 day  "

## 2024-03-09 ENCOUNTER — Other Ambulatory Visit (HOSPITAL_COMMUNITY): Payer: Self-pay

## 2024-03-09 MED ORDER — PANTOPRAZOLE SODIUM 40 MG PO TBEC
40.0000 mg | DELAYED_RELEASE_TABLET | Freq: Every day | ORAL | 1 refills | Status: AC
  Filled 2024-03-09: qty 90, 90d supply, fill #0

## 2024-05-01 ENCOUNTER — Encounter: Payer: 59 | Attending: Cardiology
